# Patient Record
Sex: Female | Born: 1980 | Race: Black or African American | Hispanic: No | Marital: Married | Smoking: Never smoker
Health system: Southern US, Community
[De-identification: ages and names within clinical notes are randomized; demographics above are authoritative.]

## PROBLEM LIST (undated history)

## (undated) ENCOUNTER — Inpatient Hospital Stay (HOSPITAL_COMMUNITY): Payer: Self-pay

## (undated) DIAGNOSIS — O1410 Severe pre-eclampsia, unspecified trimester: Secondary | ICD-10-CM

## (undated) DIAGNOSIS — D649 Anemia, unspecified: Secondary | ICD-10-CM

## (undated) HISTORY — DX: Severe pre-eclampsia, unspecified trimester: O14.10

## (undated) HISTORY — PX: NO PAST SURGERIES: SHX2092

---

## 2001-02-14 ENCOUNTER — Inpatient Hospital Stay (HOSPITAL_COMMUNITY): Admission: AD | Admit: 2001-02-14 | Discharge: 2001-02-14 | Payer: Self-pay | Admitting: Obstetrics

## 2001-03-15 ENCOUNTER — Inpatient Hospital Stay (HOSPITAL_COMMUNITY): Admission: AD | Admit: 2001-03-15 | Discharge: 2001-03-15 | Payer: Self-pay | Admitting: Obstetrics & Gynecology

## 2001-03-19 ENCOUNTER — Other Ambulatory Visit: Admission: RE | Admit: 2001-03-19 | Discharge: 2001-03-19 | Payer: Self-pay | Admitting: Obstetrics and Gynecology

## 2001-04-15 ENCOUNTER — Inpatient Hospital Stay (HOSPITAL_COMMUNITY): Admission: AD | Admit: 2001-04-15 | Discharge: 2001-04-15 | Payer: Self-pay | Admitting: Obstetrics and Gynecology

## 2001-04-26 ENCOUNTER — Inpatient Hospital Stay (HOSPITAL_COMMUNITY): Admission: AD | Admit: 2001-04-26 | Discharge: 2001-04-26 | Payer: Self-pay | Admitting: *Deleted

## 2001-06-08 ENCOUNTER — Inpatient Hospital Stay (HOSPITAL_COMMUNITY): Admission: AD | Admit: 2001-06-08 | Discharge: 2001-06-08 | Payer: Self-pay | Admitting: Obstetrics and Gynecology

## 2001-06-30 ENCOUNTER — Inpatient Hospital Stay (HOSPITAL_COMMUNITY): Admission: AD | Admit: 2001-06-30 | Discharge: 2001-06-30 | Payer: Self-pay | Admitting: Obstetrics and Gynecology

## 2001-07-17 ENCOUNTER — Inpatient Hospital Stay (HOSPITAL_COMMUNITY): Admission: AD | Admit: 2001-07-17 | Discharge: 2001-07-17 | Payer: Self-pay | Admitting: Obstetrics and Gynecology

## 2001-07-20 ENCOUNTER — Inpatient Hospital Stay (HOSPITAL_COMMUNITY): Admission: RE | Admit: 2001-07-20 | Discharge: 2001-07-20 | Payer: Self-pay | Admitting: Obstetrics and Gynecology

## 2001-07-20 ENCOUNTER — Encounter: Payer: Self-pay | Admitting: Obstetrics and Gynecology

## 2001-07-23 ENCOUNTER — Inpatient Hospital Stay (HOSPITAL_COMMUNITY): Admission: AD | Admit: 2001-07-23 | Discharge: 2001-07-25 | Payer: Self-pay | Admitting: Obstetrics and Gynecology

## 2001-08-24 ENCOUNTER — Other Ambulatory Visit: Admission: RE | Admit: 2001-08-24 | Discharge: 2001-08-24 | Payer: Self-pay | Admitting: Obstetrics and Gynecology

## 2001-10-11 ENCOUNTER — Encounter: Payer: Self-pay | Admitting: Obstetrics and Gynecology

## 2001-10-11 ENCOUNTER — Ambulatory Visit (HOSPITAL_COMMUNITY): Admission: RE | Admit: 2001-10-11 | Discharge: 2001-10-11 | Payer: Self-pay | Admitting: Obstetrics and Gynecology

## 2001-10-14 ENCOUNTER — Encounter (INDEPENDENT_AMBULATORY_CARE_PROVIDER_SITE_OTHER): Payer: Self-pay | Admitting: Specialist

## 2001-10-14 ENCOUNTER — Encounter: Payer: Self-pay | Admitting: Emergency Medicine

## 2001-10-14 ENCOUNTER — Inpatient Hospital Stay (HOSPITAL_COMMUNITY): Admission: AD | Admit: 2001-10-14 | Discharge: 2001-10-16 | Payer: Self-pay | Admitting: Obstetrics and Gynecology

## 2001-11-28 ENCOUNTER — Inpatient Hospital Stay (HOSPITAL_COMMUNITY): Admission: AD | Admit: 2001-11-28 | Discharge: 2001-11-28 | Payer: Self-pay | Admitting: Obstetrics and Gynecology

## 2001-11-28 ENCOUNTER — Encounter: Payer: Self-pay | Admitting: Obstetrics and Gynecology

## 2001-12-04 ENCOUNTER — Encounter: Admission: RE | Admit: 2001-12-04 | Discharge: 2001-12-04 | Payer: Self-pay | Admitting: Gynecology

## 2001-12-25 ENCOUNTER — Encounter: Admission: RE | Admit: 2001-12-25 | Discharge: 2001-12-25 | Payer: Self-pay | Admitting: *Deleted

## 2002-01-22 ENCOUNTER — Emergency Department (HOSPITAL_COMMUNITY): Admission: EM | Admit: 2002-01-22 | Discharge: 2002-01-22 | Payer: Self-pay | Admitting: Emergency Medicine

## 2002-01-22 ENCOUNTER — Encounter: Payer: Self-pay | Admitting: Emergency Medicine

## 2002-04-27 ENCOUNTER — Inpatient Hospital Stay (HOSPITAL_COMMUNITY): Admission: AD | Admit: 2002-04-27 | Discharge: 2002-04-27 | Payer: Self-pay | Admitting: *Deleted

## 2002-04-30 ENCOUNTER — Encounter: Admission: RE | Admit: 2002-04-30 | Discharge: 2002-04-30 | Payer: Self-pay | Admitting: *Deleted

## 2002-04-30 ENCOUNTER — Other Ambulatory Visit: Admission: RE | Admit: 2002-04-30 | Discharge: 2002-04-30 | Payer: Self-pay | Admitting: *Deleted

## 2002-04-30 ENCOUNTER — Encounter (INDEPENDENT_AMBULATORY_CARE_PROVIDER_SITE_OTHER): Payer: Self-pay | Admitting: *Deleted

## 2002-05-15 ENCOUNTER — Inpatient Hospital Stay: Admission: AD | Admit: 2002-05-15 | Discharge: 2002-05-15 | Payer: Self-pay | Admitting: Obstetrics and Gynecology

## 2002-05-23 ENCOUNTER — Inpatient Hospital Stay (HOSPITAL_COMMUNITY): Admission: AD | Admit: 2002-05-23 | Discharge: 2002-05-23 | Payer: Self-pay | Admitting: *Deleted

## 2002-05-30 ENCOUNTER — Other Ambulatory Visit: Admission: RE | Admit: 2002-05-30 | Discharge: 2002-05-30 | Payer: Self-pay | Admitting: Obstetrics & Gynecology

## 2002-05-30 ENCOUNTER — Encounter (INDEPENDENT_AMBULATORY_CARE_PROVIDER_SITE_OTHER): Payer: Self-pay | Admitting: *Deleted

## 2002-05-30 ENCOUNTER — Encounter: Admission: RE | Admit: 2002-05-30 | Discharge: 2002-05-30 | Payer: Self-pay | Admitting: Internal Medicine

## 2002-08-15 ENCOUNTER — Encounter: Admission: RE | Admit: 2002-08-15 | Discharge: 2002-08-15 | Payer: Self-pay | Admitting: Obstetrics and Gynecology

## 2002-08-20 ENCOUNTER — Encounter: Admission: RE | Admit: 2002-08-20 | Discharge: 2002-08-20 | Payer: Self-pay | Admitting: *Deleted

## 2002-09-29 ENCOUNTER — Inpatient Hospital Stay (HOSPITAL_COMMUNITY): Admission: AD | Admit: 2002-09-29 | Discharge: 2002-09-29 | Payer: Self-pay | Admitting: Family Medicine

## 2002-11-07 ENCOUNTER — Encounter: Admission: RE | Admit: 2002-11-07 | Discharge: 2002-11-07 | Payer: Self-pay | Admitting: Internal Medicine

## 2002-11-08 ENCOUNTER — Encounter: Admission: RE | Admit: 2002-11-08 | Discharge: 2002-11-08 | Payer: Self-pay | Admitting: Internal Medicine

## 2002-11-30 ENCOUNTER — Inpatient Hospital Stay (HOSPITAL_COMMUNITY): Admission: AD | Admit: 2002-11-30 | Discharge: 2002-11-30 | Payer: Self-pay | Admitting: *Deleted

## 2002-12-19 ENCOUNTER — Encounter: Admission: RE | Admit: 2002-12-19 | Discharge: 2002-12-19 | Payer: Self-pay | Admitting: Internal Medicine

## 2002-12-24 ENCOUNTER — Encounter: Admission: RE | Admit: 2002-12-24 | Discharge: 2002-12-24 | Payer: Self-pay | Admitting: Obstetrics and Gynecology

## 2003-07-01 ENCOUNTER — Encounter: Admission: RE | Admit: 2003-07-01 | Discharge: 2003-07-01 | Payer: Self-pay | Admitting: Obstetrics and Gynecology

## 2003-07-14 ENCOUNTER — Inpatient Hospital Stay (HOSPITAL_COMMUNITY): Admission: AD | Admit: 2003-07-14 | Discharge: 2003-07-14 | Payer: Self-pay | Admitting: Obstetrics & Gynecology

## 2003-09-11 ENCOUNTER — Emergency Department (HOSPITAL_COMMUNITY): Admission: EM | Admit: 2003-09-11 | Discharge: 2003-09-11 | Payer: Self-pay | Admitting: Emergency Medicine

## 2003-09-15 ENCOUNTER — Encounter: Admission: RE | Admit: 2003-09-15 | Discharge: 2003-09-15 | Payer: Self-pay | Admitting: Internal Medicine

## 2003-10-18 ENCOUNTER — Inpatient Hospital Stay (HOSPITAL_COMMUNITY): Admission: AD | Admit: 2003-10-18 | Discharge: 2003-10-18 | Payer: Self-pay | Admitting: *Deleted

## 2004-01-14 ENCOUNTER — Inpatient Hospital Stay (HOSPITAL_COMMUNITY): Admission: AD | Admit: 2004-01-14 | Discharge: 2004-01-15 | Payer: Self-pay | Admitting: Obstetrics and Gynecology

## 2004-02-06 ENCOUNTER — Inpatient Hospital Stay (HOSPITAL_COMMUNITY): Admission: AD | Admit: 2004-02-06 | Discharge: 2004-02-06 | Payer: Self-pay | Admitting: Obstetrics & Gynecology

## 2004-02-19 ENCOUNTER — Emergency Department (HOSPITAL_COMMUNITY): Admission: EM | Admit: 2004-02-19 | Discharge: 2004-02-19 | Payer: Self-pay | Admitting: Emergency Medicine

## 2004-04-08 ENCOUNTER — Inpatient Hospital Stay (HOSPITAL_COMMUNITY): Admission: AD | Admit: 2004-04-08 | Discharge: 2004-04-09 | Payer: Self-pay | Admitting: *Deleted

## 2004-06-09 ENCOUNTER — Inpatient Hospital Stay (HOSPITAL_COMMUNITY): Admission: AD | Admit: 2004-06-09 | Discharge: 2004-06-09 | Payer: Self-pay | Admitting: *Deleted

## 2004-06-25 ENCOUNTER — Ambulatory Visit: Payer: Self-pay | Admitting: *Deleted

## 2004-06-25 ENCOUNTER — Ambulatory Visit (HOSPITAL_COMMUNITY): Admission: RE | Admit: 2004-06-25 | Discharge: 2004-06-25 | Payer: Self-pay | Admitting: *Deleted

## 2004-07-25 ENCOUNTER — Inpatient Hospital Stay (HOSPITAL_COMMUNITY): Admission: AD | Admit: 2004-07-25 | Discharge: 2004-07-25 | Payer: Self-pay | Admitting: Obstetrics and Gynecology

## 2004-09-01 ENCOUNTER — Inpatient Hospital Stay (HOSPITAL_COMMUNITY): Admission: AD | Admit: 2004-09-01 | Discharge: 2004-09-01 | Payer: Self-pay | Admitting: Obstetrics & Gynecology

## 2004-09-16 ENCOUNTER — Ambulatory Visit (HOSPITAL_COMMUNITY): Admission: RE | Admit: 2004-09-16 | Discharge: 2004-09-16 | Payer: Self-pay | Admitting: Obstetrics & Gynecology

## 2004-10-15 ENCOUNTER — Inpatient Hospital Stay (HOSPITAL_COMMUNITY): Admission: AD | Admit: 2004-10-15 | Discharge: 2004-10-15 | Payer: Self-pay | Admitting: Obstetrics & Gynecology

## 2004-10-20 ENCOUNTER — Inpatient Hospital Stay (HOSPITAL_COMMUNITY): Admission: AD | Admit: 2004-10-20 | Discharge: 2004-10-20 | Payer: Self-pay | Admitting: Obstetrics

## 2004-10-23 ENCOUNTER — Inpatient Hospital Stay (HOSPITAL_COMMUNITY): Admission: AD | Admit: 2004-10-23 | Discharge: 2004-10-23 | Payer: Self-pay | Admitting: Obstetrics

## 2004-10-28 ENCOUNTER — Inpatient Hospital Stay (HOSPITAL_COMMUNITY): Admission: AD | Admit: 2004-10-28 | Discharge: 2004-10-28 | Payer: Self-pay | Admitting: Obstetrics

## 2004-11-01 ENCOUNTER — Ambulatory Visit (HOSPITAL_COMMUNITY): Admission: RE | Admit: 2004-11-01 | Discharge: 2004-11-01 | Payer: Self-pay | Admitting: Obstetrics & Gynecology

## 2004-11-03 ENCOUNTER — Inpatient Hospital Stay (HOSPITAL_COMMUNITY): Admission: AD | Admit: 2004-11-03 | Discharge: 2004-11-03 | Payer: Self-pay | Admitting: Obstetrics

## 2004-11-07 ENCOUNTER — Inpatient Hospital Stay (HOSPITAL_COMMUNITY): Admission: AD | Admit: 2004-11-07 | Discharge: 2004-11-07 | Payer: Self-pay | Admitting: Obstetrics & Gynecology

## 2004-11-10 ENCOUNTER — Inpatient Hospital Stay (HOSPITAL_COMMUNITY): Admission: AD | Admit: 2004-11-10 | Discharge: 2004-11-12 | Payer: Self-pay | Admitting: Obstetrics

## 2005-11-14 ENCOUNTER — Inpatient Hospital Stay (HOSPITAL_COMMUNITY): Admission: AD | Admit: 2005-11-14 | Discharge: 2005-11-14 | Payer: Self-pay | Admitting: *Deleted

## 2006-01-12 ENCOUNTER — Encounter (INDEPENDENT_AMBULATORY_CARE_PROVIDER_SITE_OTHER): Payer: Self-pay | Admitting: Specialist

## 2006-01-12 ENCOUNTER — Ambulatory Visit: Payer: Self-pay | Admitting: Obstetrics and Gynecology

## 2006-04-17 ENCOUNTER — Emergency Department (HOSPITAL_COMMUNITY): Admission: EM | Admit: 2006-04-17 | Discharge: 2006-04-17 | Payer: Self-pay | Admitting: Family Medicine

## 2006-04-21 ENCOUNTER — Inpatient Hospital Stay (HOSPITAL_COMMUNITY): Admission: AD | Admit: 2006-04-21 | Discharge: 2006-04-22 | Payer: Self-pay | Admitting: Family Medicine

## 2006-04-27 ENCOUNTER — Encounter: Admission: RE | Admit: 2006-04-27 | Discharge: 2006-06-14 | Payer: Self-pay | Admitting: General Practice

## 2006-11-01 ENCOUNTER — Inpatient Hospital Stay (HOSPITAL_COMMUNITY): Admission: AD | Admit: 2006-11-01 | Discharge: 2006-11-01 | Payer: Self-pay | Admitting: Family Medicine

## 2007-01-01 ENCOUNTER — Inpatient Hospital Stay (HOSPITAL_COMMUNITY): Admission: AD | Admit: 2007-01-01 | Discharge: 2007-01-01 | Payer: Self-pay | Admitting: Gynecology

## 2007-02-02 ENCOUNTER — Inpatient Hospital Stay (HOSPITAL_COMMUNITY): Admission: AD | Admit: 2007-02-02 | Discharge: 2007-02-02 | Payer: Self-pay | Admitting: Gynecology

## 2007-03-21 ENCOUNTER — Ambulatory Visit (HOSPITAL_COMMUNITY): Admission: RE | Admit: 2007-03-21 | Discharge: 2007-03-21 | Payer: Self-pay | Admitting: Obstetrics & Gynecology

## 2007-04-21 ENCOUNTER — Emergency Department (HOSPITAL_COMMUNITY): Admission: EM | Admit: 2007-04-21 | Discharge: 2007-04-21 | Payer: Self-pay | Admitting: Emergency Medicine

## 2007-05-24 ENCOUNTER — Ambulatory Visit (HOSPITAL_COMMUNITY): Admission: RE | Admit: 2007-05-24 | Discharge: 2007-05-24 | Payer: Self-pay | Admitting: Family Medicine

## 2007-06-24 ENCOUNTER — Ambulatory Visit: Payer: Self-pay | Admitting: *Deleted

## 2007-06-24 ENCOUNTER — Inpatient Hospital Stay (HOSPITAL_COMMUNITY): Admission: AD | Admit: 2007-06-24 | Discharge: 2007-06-24 | Payer: Self-pay | Admitting: Obstetrics & Gynecology

## 2007-07-12 ENCOUNTER — Inpatient Hospital Stay (HOSPITAL_COMMUNITY): Admission: AD | Admit: 2007-07-12 | Discharge: 2007-07-12 | Payer: Self-pay | Admitting: Obstetrics & Gynecology

## 2007-07-23 ENCOUNTER — Inpatient Hospital Stay (HOSPITAL_COMMUNITY): Admission: AD | Admit: 2007-07-23 | Discharge: 2007-07-23 | Payer: Self-pay | Admitting: Obstetrics

## 2007-07-24 ENCOUNTER — Inpatient Hospital Stay (HOSPITAL_COMMUNITY): Admission: AD | Admit: 2007-07-24 | Discharge: 2007-07-24 | Payer: Self-pay | Admitting: Obstetrics

## 2007-07-27 ENCOUNTER — Inpatient Hospital Stay (HOSPITAL_COMMUNITY): Admission: AD | Admit: 2007-07-27 | Discharge: 2007-07-27 | Payer: Self-pay | Admitting: Obstetrics

## 2007-08-03 ENCOUNTER — Observation Stay (HOSPITAL_COMMUNITY): Admission: AD | Admit: 2007-08-03 | Discharge: 2007-08-04 | Payer: Self-pay | Admitting: Obstetrics

## 2007-08-07 ENCOUNTER — Inpatient Hospital Stay (HOSPITAL_COMMUNITY): Admission: AD | Admit: 2007-08-07 | Discharge: 2007-08-07 | Payer: Self-pay | Admitting: Obstetrics

## 2007-08-08 ENCOUNTER — Inpatient Hospital Stay (HOSPITAL_COMMUNITY): Admission: AD | Admit: 2007-08-08 | Discharge: 2007-08-08 | Payer: Self-pay | Admitting: Obstetrics

## 2007-08-11 ENCOUNTER — Inpatient Hospital Stay (HOSPITAL_COMMUNITY): Admission: AD | Admit: 2007-08-11 | Discharge: 2007-08-11 | Payer: Self-pay | Admitting: Obstetrics

## 2007-08-17 ENCOUNTER — Inpatient Hospital Stay (HOSPITAL_COMMUNITY): Admission: AD | Admit: 2007-08-17 | Discharge: 2007-08-19 | Payer: Self-pay | Admitting: Obstetrics

## 2008-06-20 ENCOUNTER — Inpatient Hospital Stay (HOSPITAL_COMMUNITY): Admission: AD | Admit: 2008-06-20 | Discharge: 2008-06-20 | Payer: Self-pay | Admitting: Obstetrics & Gynecology

## 2008-06-30 ENCOUNTER — Emergency Department (HOSPITAL_COMMUNITY): Admission: EM | Admit: 2008-06-30 | Discharge: 2008-06-30 | Payer: Self-pay | Admitting: Emergency Medicine

## 2008-07-09 ENCOUNTER — Emergency Department (HOSPITAL_BASED_OUTPATIENT_CLINIC_OR_DEPARTMENT_OTHER): Admission: EM | Admit: 2008-07-09 | Discharge: 2008-07-09 | Payer: Self-pay | Admitting: Emergency Medicine

## 2008-08-15 ENCOUNTER — Emergency Department (HOSPITAL_COMMUNITY): Admission: EM | Admit: 2008-08-15 | Discharge: 2008-08-15 | Payer: Self-pay | Admitting: Emergency Medicine

## 2009-01-12 ENCOUNTER — Emergency Department (HOSPITAL_BASED_OUTPATIENT_CLINIC_OR_DEPARTMENT_OTHER): Admission: EM | Admit: 2009-01-12 | Discharge: 2009-01-12 | Payer: Self-pay | Admitting: Emergency Medicine

## 2009-11-30 ENCOUNTER — Emergency Department (HOSPITAL_COMMUNITY): Admission: EM | Admit: 2009-11-30 | Discharge: 2009-11-30 | Payer: Self-pay | Admitting: Family Medicine

## 2010-03-24 ENCOUNTER — Inpatient Hospital Stay (HOSPITAL_COMMUNITY): Admission: AD | Admit: 2010-03-24 | Discharge: 2010-03-25 | Payer: Self-pay | Admitting: Obstetrics & Gynecology

## 2010-03-24 ENCOUNTER — Ambulatory Visit: Payer: Self-pay | Admitting: Advanced Practice Midwife

## 2010-05-05 ENCOUNTER — Inpatient Hospital Stay (HOSPITAL_COMMUNITY): Admission: AD | Admit: 2010-05-05 | Discharge: 2010-05-06 | Payer: Self-pay | Admitting: Obstetrics and Gynecology

## 2010-06-22 ENCOUNTER — Inpatient Hospital Stay (HOSPITAL_COMMUNITY): Admission: AD | Admit: 2010-06-22 | Discharge: 2010-06-22 | Payer: Self-pay | Admitting: Obstetrics & Gynecology

## 2010-06-25 ENCOUNTER — Ambulatory Visit (HOSPITAL_COMMUNITY)
Admission: RE | Admit: 2010-06-25 | Discharge: 2010-06-25 | Payer: Self-pay | Source: Home / Self Care | Admitting: Family Medicine

## 2010-07-29 ENCOUNTER — Inpatient Hospital Stay (HOSPITAL_COMMUNITY)
Admission: AD | Admit: 2010-07-29 | Discharge: 2010-07-29 | Payer: Self-pay | Source: Home / Self Care | Attending: Obstetrics & Gynecology | Admitting: Obstetrics & Gynecology

## 2010-08-23 ENCOUNTER — Inpatient Hospital Stay (HOSPITAL_COMMUNITY)
Admission: AD | Admit: 2010-08-23 | Discharge: 2010-08-23 | Payer: Self-pay | Source: Home / Self Care | Attending: Obstetrics and Gynecology | Admitting: Obstetrics and Gynecology

## 2010-08-25 LAB — COMPREHENSIVE METABOLIC PANEL
ALT: 20 U/L (ref 0–35)
AST: 28 U/L (ref 0–37)
Albumin: 3.1 g/dL — ABNORMAL LOW (ref 3.5–5.2)
Alkaline Phosphatase: 155 U/L — ABNORMAL HIGH (ref 39–117)
BUN: 5 mg/dL — ABNORMAL LOW (ref 6–23)
CO2: 23 mEq/L (ref 19–32)
Calcium: 9.1 mg/dL (ref 8.4–10.5)
Chloride: 105 mEq/L (ref 96–112)
Creatinine, Ser: 0.57 mg/dL (ref 0.4–1.2)
GFR calc Af Amer: >60 mL/min (ref >60)
GFR calc non Af Amer: >60 mL/min (ref >60)
Glucose, Bld: 88 mg/dL (ref 70–99)
Potassium: 3.2 mEq/L — ABNORMAL LOW (ref 3.5–5.1)
Sodium: 135 mEq/L (ref 135–145)
Total Bilirubin: 0.5 mg/dL (ref 0.3–1.2)
Total Protein: 7.1 g/dL (ref 6.0–8.3)

## 2010-08-25 LAB — DIFFERENTIAL
Basophils Absolute: 0 10*3/uL (ref 0.0–0.1)
Basophils Relative: 0 % (ref 0–1)
Eosinophils Absolute: 0.1 10*3/uL (ref 0.0–0.7)
Eosinophils Relative: 1 % (ref 0–5)
Lymphocytes Relative: 14 % (ref 12–46)
Lymphs Abs: 1.1 10*3/uL (ref 0.7–4.0)
Monocytes Absolute: 0.7 10*3/uL (ref 0.1–1.0)
Monocytes Relative: 9 % (ref 3–12)
Neutro Abs: 6.1 10*3/uL (ref 1.7–7.7)
Neutrophils Relative %: 76 % (ref 43–77)

## 2010-08-25 LAB — URINALYSIS, ROUTINE W REFLEX MICROSCOPIC
Bilirubin Urine: NEGATIVE
Hgb urine dipstick: NEGATIVE
Ketones, ur: 15 mg/dL — AB
Nitrite: NEGATIVE
Protein, ur: NEGATIVE mg/dL
Specific Gravity, Urine: >1.03 — ABNORMAL HIGH (ref 1.005–1.030)
Urine Glucose, Fasting: NEGATIVE mg/dL
Urobilinogen, UA: 2 mg/dL — ABNORMAL HIGH (ref 0.0–1.0)
pH: 6 (ref 5.0–8.0)

## 2010-08-25 LAB — CBC
HCT: 30.3 % — ABNORMAL LOW (ref 36.0–46.0)
Hemoglobin: 9.4 g/dL — ABNORMAL LOW (ref 12.0–15.0)
MCH: 23.3 pg — ABNORMAL LOW (ref 26.0–34.0)
MCHC: 31 g/dL (ref 30.0–36.0)
MCV: 75.2 fL — ABNORMAL LOW (ref 78.0–100.0)
Platelets: 261 10*3/uL (ref 150–400)
RBC: 4.03 MIL/uL (ref 3.87–5.11)
RDW: 16.5 % — ABNORMAL HIGH (ref 11.5–15.5)
WBC: 8 10*3/uL (ref 4.0–10.5)

## 2010-08-25 LAB — TSH: TSH: 1.099 u[IU]/mL (ref 0.350–4.500)

## 2010-08-28 ENCOUNTER — Encounter: Payer: Self-pay | Admitting: *Deleted

## 2010-08-30 DIAGNOSIS — R0602 Shortness of breath: Secondary | ICD-10-CM | POA: Insufficient documentation

## 2010-08-30 DIAGNOSIS — D509 Iron deficiency anemia, unspecified: Secondary | ICD-10-CM | POA: Insufficient documentation

## 2010-08-30 DIAGNOSIS — Z9189 Other specified personal risk factors, not elsewhere classified: Secondary | ICD-10-CM | POA: Insufficient documentation

## 2010-08-31 ENCOUNTER — Ambulatory Visit
Admission: RE | Admit: 2010-08-31 | Discharge: 2010-08-31 | Payer: Self-pay | Source: Home / Self Care | Attending: Cardiology | Admitting: Cardiology

## 2010-08-31 ENCOUNTER — Encounter: Payer: Self-pay | Admitting: Cardiology

## 2010-08-31 DIAGNOSIS — R079 Chest pain, unspecified: Secondary | ICD-10-CM | POA: Insufficient documentation

## 2010-09-02 ENCOUNTER — Inpatient Hospital Stay (HOSPITAL_COMMUNITY)
Admission: AD | Admit: 2010-09-02 | Discharge: 2010-09-02 | Payer: Self-pay | Source: Home / Self Care | Attending: Obstetrics and Gynecology | Admitting: Obstetrics and Gynecology

## 2010-09-09 ENCOUNTER — Ambulatory Visit (HOSPITAL_COMMUNITY): Payer: Medicaid Other | Attending: Obstetrics and Gynecology

## 2010-09-09 DIAGNOSIS — I2510 Atherosclerotic heart disease of native coronary artery without angina pectoris: Secondary | ICD-10-CM | POA: Insufficient documentation

## 2010-09-09 DIAGNOSIS — I059 Rheumatic mitral valve disease, unspecified: Secondary | ICD-10-CM | POA: Insufficient documentation

## 2010-09-09 DIAGNOSIS — I319 Disease of pericardium, unspecified: Secondary | ICD-10-CM | POA: Insufficient documentation

## 2010-09-09 DIAGNOSIS — O99891 Other specified diseases and conditions complicating pregnancy: Secondary | ICD-10-CM | POA: Insufficient documentation

## 2010-09-09 DIAGNOSIS — R011 Cardiac murmur, unspecified: Secondary | ICD-10-CM | POA: Insufficient documentation

## 2010-09-09 NOTE — Assessment & Plan Note (Signed)
Summary:  Cardiology   Visit Type:  new patient visit Primary Provider:  [facility]  CC:   and pressure in chest.  History of Present Illness: Christy [name] is a 30 year old F. American female who's been having a 3 week history of chest pain and felt like her heart shakes and flutters.  She is [redacted] weeks pregnant. This is her sixth pregnancy. She has had no complications from cardiac standpoint with previous pregnancies. There is no cardiac history.  She went to the emergency room on the 19th. Her EKG is normal. Potassium was low at 3.2. She was instructed on potassium rich foods but no supplemental potassium given. She is anemic with a hemoglobin 9.4. Her vital signs were stable with a normal pulse ox of 97%.  She notices the discomfort and the palpitations worse when she is lying on her left side. She denies orthopnea PND but she has had some dependent edema recently. Her blood pressures done a good control.  She has a history of reflux but has not had any recent reflux symptoms.  She denies any syncope or presyncope.she did not have this with previous pregnancies.    Current Medications (verified): 1)  Iron 325 (65 Fe) Mg Tabs (Ferrous Sulfate) .... Qd  Allergies (verified): 1)  ! Bactrim  Past History:  Past Medical History: Last updated: 08/30/2010 CHEST Caz Weaver PAIN, HX OF (ICD-V15.89) SHORTNESS OF BREATH (ICD-786.05) PALPITATIONS (ICD-785.1) ANEMIA, IRON DEFICIENCY (ICD-280.9)  Family History: Last updated: 08/31/2010 Family History of Hypertension: mother and father  Social History: Last updated: 08/30/2010 Tobacco Use - No.  Alcohol Use - no Drug Use - no  Risk Factors: Smoking Status: never (08/30/2010)  Past Surgical History: IUD removal  Family History: Family History of Hypertension: mother and father  Review of Systems       negative other than history of present illness  Vital Signs:  Patient profile:   30 year old  female Height:      64 inches Weight:      183 pounds BMI:     31.53 Pulse rate:   106 / minute Pulse rhythm:   irregular BP sitting:   106 / 58  (left arm) Cuff size:   large  Vitals Entered By: Danielle Rankin, CMA (August 31, 2010 3:33 PM)  Physical Exam  General:  Well developed, well nourished, in no acute distress. Head:  normocephalic and atraumatic Eyes:  PERRLA/EOM intact; conjunctiva and lids normal. Neck:  Neck supple, no JVD. No masses, thyromegaly or abnormal cervical nodes. Chest Derald Lorge:  no deformities or breast masses noted Lungs:  Clear bilaterally to auscultation and percussion. Heart:  PMI nondisplaced, rapid rate which is regular and about 100 105 beats per minute. Normal S1-S2 no gallop systolic murmur along left sternal border. It radiates into the base the right neck. Carotids are full. No thyromegaly.no gallop Abdomen:  gravida Msk:  Back normal, normal gait. Muscle strength and tone normal. Pulses:  pulses normal in all 4 extremities Extremities:  trace left pedal edema and trace right pedal edema.   Neurologic:  Alert and oriented x 3. Skin:  Intact without lesions or rashes. Psych:  Normal affect.   Problems:  Medical Problems Added: 1)  Dx of Murmur  (ICD-785.2) 2)  Dx of Chest Pain-unspecified  (ICD-786.50)  Impression & Recommendations:  Problem # 1:  PALPITATIONS (ICD-785.1) Assessment New  I suspect these are benign. They have probably been exacerbated by pregnancy and its inherent stress, anemia which is  causing her to be in sinus tachycardia, and hyperkalemia.Will check 2-D echo and give potassium supplementation. Her murmur is most likely benign however does radiate into the base of her right neck.   Orders: EKG w/ Interpretation (93000)  Problem # 2:  CHEST PAIN-UNSPECIFIED (ICD-786.50)  I suspect this is related to the palpitations, tachycardia and tension. Check echo to rule out any other causes.  Orders: EKG w/ Interpretation  (93000)  Other Orders: Echocardiogram (Echo)  Patient Instructions: 1)  Your physician has recommended you make the following change in your medication: Start Potassium 2)  Your physician has requested that you increase the amount of potassium in your diet. Please see [facility] handout. 3)  Your physician has requested that you have an echocardiogram.  Echocardiography is a painless test that uses sound waves to create images of your heart. It provides your doctor with information about the size and shape of your heart and how well your heart's chambers and valves are working.  This procedure takes approximately one hour. There are no restrictions for this procedure. SCHEDULE FOR NEXT WEEK Prescriptions: POTASSIUM CHLORIDE CRYS CR 20 MEQ CR-TABS (POTASSIUM CHLORIDE CRYS CR) Take one tablet by mouth daily  #30 x 6   Entered by:   Lisabeth Devoid RN   Authorized by:   Gaylord Shih, MD, [facility]   Signed by:   Lisabeth Devoid RN on 08/31/2010   Method used:   Electronically to        [facility]  [address] [phone] (retail)       [address]       [place], [place]  [id number]       Ph: [phone] or [phone]       Fax: [phone]   RxID:   [id number]

## 2010-09-15 NOTE — Assessment & Plan Note (Signed)
Summary: np6. 36 weeks, heart fluttering on pain right chest Christy [name]. sob...   Allergies: 1)  ! Bactrim

## 2010-09-16 ENCOUNTER — Inpatient Hospital Stay (HOSPITAL_COMMUNITY)
Admission: AD | Admit: 2010-09-16 | Discharge: 2010-09-19 | DRG: 774 | Disposition: A | Discharge status: Home / Self Care | Payer: Medicaid Other | Source: Ambulatory Visit | Attending: Obstetrics and Gynecology | Admitting: Obstetrics and Gynecology

## 2010-09-16 DIAGNOSIS — O9903 Anemia complicating the puerperium: Secondary | ICD-10-CM | POA: Diagnosis not present

## 2010-09-16 DIAGNOSIS — D649 Anemia, unspecified: Secondary | ICD-10-CM | POA: Diagnosis not present

## 2010-09-16 LAB — COMPREHENSIVE METABOLIC PANEL
AST: 41 U/L — ABNORMAL HIGH (ref 0–37)
Albumin: 3.3 g/dL — ABNORMAL LOW (ref 3.5–5.2)
BUN: 4 mg/dL — ABNORMAL LOW (ref 6–23)
CO2: 18 mEq/L — ABNORMAL LOW (ref 19–32)
Calcium: 9.4 mg/dL (ref 8.4–10.5)
Chloride: 108 mEq/L (ref 96–112)
Glucose, Bld: 78 mg/dL (ref 70–99)
Potassium: 3.5 mEq/L (ref 3.5–5.1)
Total Protein: 7.2 g/dL (ref 6.0–8.3)

## 2010-09-16 LAB — CBC: HCT: 32 % — ABNORMAL LOW (ref 36.0–46.0)

## 2010-09-18 LAB — CBC
HCT: 26.4 % — ABNORMAL LOW (ref 36.0–46.0)
MCHC: 29.9 g/dL — ABNORMAL LOW (ref 30.0–36.0)
MCV: 74.8 fL — ABNORMAL LOW (ref 78.0–100.0)
Platelets: 237 10*3/uL (ref 150–400)
RDW: 18.1 % — ABNORMAL HIGH (ref 11.5–15.5)
WBC: 9.2 10*3/uL (ref 4.0–10.5)

## 2010-09-18 LAB — COMPREHENSIVE METABOLIC PANEL
BUN: 4 mg/dL — ABNORMAL LOW (ref 6–23)
GFR calc non Af Amer: >60 mL/min (ref >60)
Glucose, Bld: 79 mg/dL (ref 70–99)
Total Protein: 5.5 g/dL — ABNORMAL LOW (ref 6.0–8.3)

## 2010-10-05 ENCOUNTER — Emergency Department (HOSPITAL_COMMUNITY)
Admission: EM | Admit: 2010-10-05 | Discharge: 2010-10-05 | Disposition: A | Discharge status: Home / Self Care | Payer: Medicaid Other | Attending: Emergency Medicine | Admitting: Emergency Medicine

## 2010-10-05 DIAGNOSIS — R220 Localized swelling, mass and lump, head: Secondary | ICD-10-CM | POA: Insufficient documentation

## 2010-10-05 DIAGNOSIS — K219 Gastro-esophageal reflux disease without esophagitis: Secondary | ICD-10-CM | POA: Insufficient documentation

## 2010-10-05 DIAGNOSIS — R51 Headache: Secondary | ICD-10-CM | POA: Insufficient documentation

## 2010-10-05 DIAGNOSIS — R221 Localized swelling, mass and lump, neck: Secondary | ICD-10-CM | POA: Insufficient documentation

## 2010-10-05 DIAGNOSIS — R599 Enlarged lymph nodes, unspecified: Secondary | ICD-10-CM | POA: Insufficient documentation

## 2010-10-18 LAB — WET PREP, GENITAL
Clue Cells Wet Prep HPF POC: NONE SEEN
Trich, Wet Prep: NONE SEEN
Yeast Wet Prep HPF POC: NONE SEEN

## 2010-10-19 LAB — GC/CHLAMYDIA PROBE AMP, GENITAL
Chlamydia, DNA Probe: NEGATIVE
GC Probe Amp, Genital: NEGATIVE

## 2010-10-19 LAB — URINALYSIS, ROUTINE W REFLEX MICROSCOPIC
Glucose, UA: NEGATIVE mg/dL
Ketones, ur: 15 mg/dL — AB
Nitrite: NEGATIVE
Protein, ur: NEGATIVE mg/dL

## 2010-10-19 LAB — WET PREP, GENITAL

## 2010-10-19 LAB — RAPID URINE DRUG SCREEN, HOSP PERFORMED: Benzodiazepines: NOT DETECTED

## 2010-10-21 LAB — URINALYSIS, ROUTINE W REFLEX MICROSCOPIC
Bilirubin Urine: NEGATIVE
Ketones, ur: NEGATIVE mg/dL
Nitrite: NEGATIVE
Protein, ur: NEGATIVE mg/dL
Specific Gravity, Urine: >1.03 — ABNORMAL HIGH (ref 1.005–1.030)
Urobilinogen, UA: 0.2 mg/dL (ref 0.0–1.0)

## 2010-10-21 LAB — GC/CHLAMYDIA PROBE AMP, GENITAL
Chlamydia, DNA Probe: NEGATIVE
GC Probe Amp, Genital: NEGATIVE

## 2010-10-21 LAB — WET PREP, GENITAL: Trich, Wet Prep: NONE SEEN

## 2010-11-22 LAB — POCT I-STAT, CHEM 8
BUN: 6 mg/dL (ref 6–23)
Calcium, Ion: 1.22 mmol/L (ref 1.12–1.32)
Creatinine, Ser: 0.7 mg/dL (ref 0.4–1.2)
Glucose, Bld: 84 mg/dL (ref 70–99)
TCO2: 22 mmol/L (ref 0–100)

## 2010-11-22 LAB — POCT CARDIAC MARKERS
CKMB, poc: <1 ng/mL — ABNORMAL LOW (ref 1.0–8.0)
Myoglobin, poc: 31.9 ng/mL (ref 12–200)
Troponin i, poc: <0.05 ng/mL (ref 0.00–0.09)

## 2010-11-22 LAB — D-DIMER, QUANTITATIVE

## 2010-12-24 NOTE — Group Therapy Note (Signed)
NAMEAURIAH, Christy [name]                       ACCOUNT NO.:  [id number]   MEDICAL RECORD NO.:  [id number]                   PATIENT TYPE:  OUT   LOCATION:  [facility]                           FACILITY:  [facility]   PHYSICIAN:  Elsie Lincoln, MD                   DATE OF BIRTH:  [birth year]   DATE OF SERVICE:  07/01/2003                                    CLINIC NOTE   REASON FOR VISIT:  This is a 30 year old G 5, para 3-0-2-3, LMP May 12, 2003 with a positive pregnancy test late October and then a negative  pregnancy test at home and a negative pregnancy test here today.  After a  long discussion and education the patient realizes that she had an early  miscarriage.  The patient has not had any bleeding yet, however she did  place the NuvaRing after a negative pregnancy test, left it in for three  weeks and she just removed it and is expecting her period this week.  The  patient comes complaining of ovarian pain for 1-1/2 months.  The pain is  intermittent and sharp causing her to double over.  She had had a history of  chronic pelvic pain before but this is secondary to chronic constipation.  She takes a stool softener at this time and has regular bowel movements.  The patient denies any correlation with cycle with this pain.  The patient  denies nausea, vomiting, diarrhea. The patient has normal menses up until  May 12, 2003 using the NuvaRing.   PAST MEDICAL HISTORY:  Denies.   PAST SURGICAL HISTORY:  A laparoscopic IUD retrieval done here in 2003.   GYNECOLOGICAL HISTORY:  Chlamydia in the past.  Three spontaneous vaginal  deliveries and two early miscarriages.  The patient is bisexual and has  currently both female and female partners.  She also complains of chronic  yeast infection.   FAMILY HISTORY:  No history of uterine, ovarian, cervical cancer.   PHYSICAL EXAMINATION:  VITAL SIGNS:  Temperature 97.4, pulse 88, blood  pressure 106/66, weight 152.1, height 5 feet 4  inches.  ABDOMEN:  Soft, nontender, nondistended.  PELVIC:  External genitalia Tanner V.  Vagina small amount of white chunky  discharge consistent with yeast.  Cervix closed, nontender, nonfriable.  Uterus small, anteverted, nontender.  Adnexa:  Ovaries palpated, nontender,  not enlarged.   ASSESSMENT:  A 30 year old female with adnexal pain for 1-1/2 months and  vaginitis.   PLAN:  1. Adnexal pain:  Will get transvaginal ultrasound to evaluate ovaries.  GC     and Chlamydia done today to rule out cervicitis and early PID.  2. Diflucan 150 mg p.o. q. week x2 weeks with refill given.  If the patient     returns and yeast still present, will take yeast culture to see if     species is Candida albicans.  The patient states she has problems with  chronic yeast.  3. Pap smear done today.  4. The patient requests ________ secondary to multiple sex partners.  The     patient encouraged to use condoms and     dental dams whenever sexually active.  5. Return to clinic in six weeks.                                               Elsie Lincoln, MD    KL/MEDQ  D:  07/01/2003  T:  07/01/2003  Job:  [id number]

## 2010-12-24 NOTE — Group Therapy Note (Signed)
[name], [name] NO.:  [id number]   MEDICAL RECORD NO.:  [id number]          PATIENT TYPE:  WOC   LOCATION:  [facility]                   FACILITY:  [facility]   PHYSICIAN:  Argentina Donovan, MD        DATE OF BIRTH:  [birth year]   DATE OF SERVICE:  01/12/2006                                    CLINIC NOTE   HISTORY OF PRESENT ILLNESS:  The patient is a 30 year old gravida 6, para 4-  0-2-4 with one child 104-year-old and who had a voluntary interruption of  pregnancy in February.  Since that time, she has had increasing problems  with dyspareunia, especially on deep penetration.  The last two cycles where  she has been on a Nuva Ring, she has had increasing dysmenorrhea with  increase in bleeding.  She has no bleeding between her cycles.  The pain  with her last two cycles has been bad enough that she was unable to work and  she has a Garment/textile technologist job.   PHYSICAL EXAMINATION:  ABDOMEN:  Soft, flat, nontender.  No masses or  organomegaly.  External genitalia is normal.  BUS within normal limits.  Vagina is clean and well rugated.  Cervix is clean and parous.  Pap smear  was taken as well as a wet-prep and check for GC/Chlamydia.  Uterus is small  anterior and normal in size, shape and consistency and not very tender.  Nor  is the adnexa abnormal.  However, there is some discomfort on deep palpation  of the mid portion of the anterior vaginal wall.  Discussed her findings  with the patient.  I do not see a significant amount of descensus.  She is  showing signs of, I think, possibility of adenomyosis which may be  increasing.  She has been using the [employer] successfully for the last  several months.  I suggest she use it three months regularly and only stop  it for a week in the three months to see if she could live with this  increase in dysmenorrhea.  The only alternative that I think we have for her  would be hysterectomy.  On the other hand, the  dyspareunia I have a  difficult time explaining.  She has been with the same partner, it is not as  if she changed partners and size has changed, but good possibility I think  of an early interstitial cystitis remains, and I expect that if that is the  case, the symptoms in time will get worse.  I have suggested that she change  positions and vary positions during coitus, thinking that if she does this,  the uterus will fall out of the pelvis and change positions and relieve that  discomfort.  However, if it continues in spite of that, then I think  referral to a urologist is appropriate.   IMPRESSION:  Increasing dysmenorrhea with dyspareunia.           ______________________________  Argentina Donovan, MD     PR/MEDQ  D:  01/12/2006  T:  01/13/2006  Job:  [id number]

## 2010-12-24 NOTE — Discharge Summary (Signed)
[facility]  Patient:    Christy [name], Christy [name] Visit Number: [id number] MRN: [id number]          Service Type: OBS Location: 910A 9112 01 Attending Physician:  Wandalee Ferdinand Dictated by:   Rudy Jew Ashley Royalty, M.D. Admit Date:  07/23/2001 Discharge Date: 07/25/2001                             Discharge Summary  DISCHARGE DIAGNOSES:          1. Intrauterine pregnancy at term, delivered.                               2. Group B Strep carrier.                               3. Late presentation for care.                               4. Difficult social situation.                               5. Term birth living child, vertex.  PROCEDURE:                    OB delivery.  CONSULTING PHYSICIANS:        None.  DISCHARGE MEDICATIONS:        Motrin 800 mg t.i.d. p.r.n.  HISTORY OF PRESENT ILLNESS:   This is a 30 year old, gravida 4, para 2, abortus 1, at term with the aforementioned risk factors.  The patient presented for induction at her request due to limited availability of childcare and advanced cervical changes at term.  She did have the amniocentesis on July 20, 2001, which demonstrated fetal lung maturity.  HOSPITAL COURSE:              The patient was admitted to [facility].  Admission laboratory studies were drawn.  She went on to deliver on July 23, 2001, a 6 pound 10 ounce female, Apgars 9 at one minute and 9 at five minutes sent to the newborn nursery.  Delivery was accomplished by Fayrene Fearing A. Ashley Royalty, M.D. over intact perineum.  The patients postpartum course was benign.  She was discharged on July 25, 2001, afebrile and in satisfactory condition.  ACCESSORY CLINICAL DATA:      Hemoglobin and hematocrit on admission were 9.6 and 28.8, respectively.  Repeat values were obtained on July 24, 2001, and the values were 8.8 and 26.3, respectively.  DISPOSITION:                  The patient is to return  to [facility] in four to six weeks for postpartum evaluation. Dictated by:   Rudy Jew Ashley Royalty, M.D. Attending Physician:  Wandalee Ferdinand DD:  08/24/01 TD:  08/27/01 Job: [id number]

## 2010-12-24 NOTE — Discharge Summary (Signed)
[facility]  Patient:    Christy [name], Christy [name] Visit Number: [id number] MRN: [id number]          Service Type: GYN Location: 910A 9144 01 Attending Physician:  Enid Cutter Dictated by:   Shearon Balo,, M.D. Admit Date:  10/14/2001 Discharge Date: 10/16/2001                             Discharge Summary  REASON FOR ADMISSION:         Abdominal pain.  HISTORY OF PRESENT ILLNESS:   A 30 year old, gravida 4, para 3-0-1-3, with abdominal intrauterine device.  HOSPITAL COURSE:              The patient is a 30 year old, gravida 4, para 3-0-1-3, who was admitted on October 14, 2001, with increasing abdominal pain. She has been known to have abdominal placement of an IUD that was placed shortly after spontaneous vaginal delivery in December of 2002.  She began having worsening abdominal pain.  She presented to the emergency department on the day of admission with increasing abdominal pain and KUB and CT scan were performed which revealed abdominal placement of her IUD.  The patient was admitted by [facility] E. Dareen Piano, M.D. for evaluation and treatment.  The day of surgery, Dr. Dareen Piano was unable to perform her surgery and she was transferred to the [facility] for further evaluation and treatment.  The patient was taken to the operating room where she underwent diagnostic laparoscopy.  Intraoperatively, she was found to have an IUD that was encapsulated with omental adhesions.  The IUD was successfully excised and removed.  The patient tolerated the procedure well and returned to the recovery room in stable condition.  The patient remained in the hospital overnight for observation.  She was discharged home on postoperative day #1 after tolerating a regular diet and having good pain control.  In addition, she was voiding spontaneously without difficulty.  She is ambulating well. The patient was discharged home in good condition.  DISCHARGE  INSTRUCTIONS:       Minimize activity through the next week.  No dietary restrictions.  Her bandages will be removed in three days.  She is to follow up with Dr. Orlene Erm in the [facility] in approximately two weeks.  DISCHARGE MEDICATIONS:        1. Vicodin one to two p.o. q.4-6h. p.r.n.                               2. Motrin one p.o. q.8h. p.r.n.                               3. Colace q.d.Dictated by:   Shearon Balo,, M.D.  Attending Physician:  Enid Cutter DD:  11/07/01 TD:  11/08/01 Job: [id number]

## 2010-12-24 NOTE — Op Note (Signed)
[facility]  Patient:    Christy [name], Christy [name] Visit Number: [id number] MRN: [id number]          Service Type: EMS Location: MINO Attending Physician:  Devoria Albe Dictated by:   Shearon Balo, M.D. Proc. Date: 10/15/01 Admit Date:  10/14/2001 Discharge Date: 10/14/2001                             Operative Report  PREOPERATIVE DIAGNOSES:       A 30 year old G4, P3-0-1-3 with abdominal intrauterine device.  POSTOPERATIVE DIAGNOSES:      A 30 year old G4, P3-0-1-3 with abdominal intrauterine device, encapsulated intrauterine device and omental adhesions.  PROCEDURE:                    Laparoscopic removal of intrauterine device and omental adhesions.  SURGEON:                      Shearon Balo, M.D.  ANESTHESIA:                   General endotracheal.  COMPLICATIONS:                None.  SPECIMENS:                    IUD with omental adhesions and metallic appearing ring structure found along the colon.  DISPOSITION:                  Recovery room, stable.  INDICATIONS FOR PROCEDURE:    The patient is a 30 year old G4, P3-0-1-3 status post spontaneous vaginal delivery in December 2002.  The patient underwent IUD placement in a local physicians office.  Subsequent to IUD placement approximately four weeks postpartum she began to have increasing abdominal pain.  This worsened over the past few weeks.  She has also been complaining of constipation.  She underwent physical examination which revealed no IUD strings and ultrasound which revealed no evidence of intrauterine placement of the IUD.  A KUB and CT scan were performed which revealed abdominal placement of the IUD near the cecum.  The patient was consented for laparoscopic removal of the IUD.  She was given the risks of surgery including bleeding, infection, injury to internal organs such as bowel and bladder.  She also was consented preoperatively for possible laparotomy.  PROCEDURE:                     Patient was taken to the operating room.  She was placed under general endotracheal anesthesia.  She was placed in the [place] stirrups and prepped and draped in a sterile fashion.  Foley catheter was placed in the bladder through the procedure.  An umbilical incision was made with a scalpel and the Veress needle was inserted.  The abdomen was then insufflated with CO2.  A 10 mm trocar was introduced through the umbilical incision.  Visualization in the pelvis revealed what appeared to be the IUD at the tip of some omentum.  A decision was made to put in second ports and a midline 5 mm trocar was inserted under direct visualization as well as a left lateral 5 mm port.  The IUD was identified and grasped with an atraumatic grasper.  There were dense adhesions across the IUD of omentum.  The decision was made to use electrocautery and endo shears to dissect across the  omental adhesions.  This was performed with coagulation.  This was dissected without difficulty.  Once the IUD was freed it was removed through one of the 5 mm ports.  Irrigation was performed of this area.  At the end of the procedure there was noted to be a small metallic structure along the bowel that was grasped with an atraumatic grasper and brought through the incision and sent for pathology as well.  Irrigation of the pelvis revealed no active site of bleeding.  There was no evidence of any further metallic objects after exploration of the abdomen.  The appendix was visualized and appeared to be normal.  The trocars were removed under direct visualization and the CO2 was evacuated from the abdomen.  The umbilical incision was repaired with a subcuticular stitch of 2-0 Vicryl.  The two 5 mm port incisions were repaired with interrupted sutures of 4-0 Monocryl.  These incisions were hemostatic. The instruments were removed from the vagina and the cervix was noted to be hemostatic.  The Foley catheter was removed  and the patient was awakened and taken to the recovery room in stable condition. Dictated by:   Shearon Balo, M.D. Attending Physician:  Devoria Albe DD:  10/15/01 TD:  10/16/01 Job: [id number]

## 2010-12-24 NOTE — Discharge Summary (Signed)
[facility]  Patient:    Christy [name], Christy [name] Visit Number: [id number] MRN: [id number]          Service Type: OBS Location: 910A 9112 01 Attending Physician:  Wandalee Ferdinand Dictated by:   Rudy Jew Ashley Royalty, M.D. Admit Date:  07/23/2001 Discharge Date: 07/25/2001                             Discharge Summary  DISCHARGE DIAGNOSES:          1. Intrauterine pregnancy at term, delivered.                               2. Positive Group B Streptococcus culture.                               3. Late presentation for obstetric care.                               4. Difficult social situation.                               5. Term birth living child vertex.  OPERATIONS AND SPECIAL PROCEDURES:                   OB delivery.  CONSULTATIONS:                None.  DISCHARGE MEDICATIONS:        Motrin 800 mg t.i.d. p.r.n.  HISTORY AND PHYSICAL:         This is a 30 year old, Gravida 4, Para 2, AB 1, 38 weeks 2 days gestation.  Prenatal care was complicated by late presentation, atypia on Pap smear and positive Group B Strep culture.  The patient was admitted for induction/augmentation of labor secondary to limited availability of child care in the coming week.  She did have an amniocentesis July 20, 2001 which documented fetal lung maturity.  For the remainder of the history and physical please see the chart.  HOSPITAL COURSE:              The patient was admitted to [facility].  Admission laboratory studies were drawn.  She received a vaginal examination on July 23, 2002 which revealed the cervix to be 3+ cm dilated, 70% effaced, minus 1 minus 2 station, vertex presentation. Artificial rupture of membranes was accomplished which revealed a clear fluid. Pitocin was provided.  The patient went on to labor and delivered at 2:06 p.m. on July 23, 2001.  The infant was a 6 pound 10 ounce female, Apgars 9 at one minute and 9  at five minutes, sent to [facility].  Delivery was accomplished per Dr. Sylvester Harder over an intact perineum without any laceration.  The patients post partum course was benign.  She was discharged on the second post partum day, afebrile and in satisfactory condition.  ACCESSORY CLINICAL FINDINGS:  Hemoglobin and hematocrit were obtained on admission and the values were 9.6 and 28.8 respectively.  Repeat values obtained July 24, 2001 revealed values of 8.8 and 26.3 respectively.  RPR was nonreactive.  DISPOSITION:  The patient is to return to [facility] in four to six weeks for postpartum evaluation.Dictated by:   Rudy Jew Ashley Royalty, M.D. Attending Physician:  Wandalee Ferdinand DD:  07/25/01 TD:  07/26/01 Job: [id number]

## 2011-04-27 LAB — WET PREP, GENITAL
Trich, Wet Prep: NONE SEEN
Yeast Wet Prep HPF POC: NONE SEEN

## 2011-04-27 LAB — CBC
Hemoglobin: 8.6 — ABNORMAL LOW
Platelets: 279
RBC: 3.58 — ABNORMAL LOW
RDW: 18.2 — ABNORMAL HIGH
RDW: 18.8 — ABNORMAL HIGH
WBC: 5.6

## 2011-04-27 LAB — RPR: RPR Ser Ql: NONREACTIVE

## 2011-05-10 LAB — WET PREP, GENITAL
Trich, Wet Prep: NONE SEEN
Yeast Wet Prep HPF POC: NONE SEEN

## 2011-05-10 LAB — GC/CHLAMYDIA PROBE AMP, GENITAL
Chlamydia, DNA Probe: NEGATIVE
GC Probe Amp, Genital: NEGATIVE

## 2011-05-13 LAB — URINALYSIS, ROUTINE W REFLEX MICROSCOPIC
Bilirubin Urine: NEGATIVE
Glucose, UA: NEGATIVE
Hgb urine dipstick: NEGATIVE
Ketones, ur: NEGATIVE
Nitrite: NEGATIVE
pH: 6
pH: 6

## 2011-05-13 LAB — STREP B DNA PROBE: Strep Group B Ag: NEGATIVE

## 2011-05-13 LAB — URINE MICROSCOPIC-ADD ON

## 2011-05-16 LAB — URINALYSIS, ROUTINE W REFLEX MICROSCOPIC
Nitrite: NEGATIVE
Specific Gravity, Urine: 1.015
pH: 7

## 2011-05-17 LAB — WET PREP, GENITAL

## 2011-05-17 LAB — URINALYSIS, ROUTINE W REFLEX MICROSCOPIC
Bilirubin Urine: NEGATIVE
Nitrite: NEGATIVE
Specific Gravity, Urine: 1.015
Urobilinogen, UA: 0.2

## 2011-05-17 LAB — GC/CHLAMYDIA PROBE AMP, GENITAL
Chlamydia, DNA Probe: NEGATIVE
GC Probe Amp, Genital: NEGATIVE

## 2012-10-01 ENCOUNTER — Emergency Department (HOSPITAL_COMMUNITY)
Admission: EM | Admit: 2012-10-01 | Discharge: 2012-10-01 | Disposition: A | Discharge status: Home / Self Care | Payer: Medicaid Other | Source: Home / Self Care | Attending: Emergency Medicine | Admitting: Emergency Medicine

## 2012-10-01 ENCOUNTER — Encounter (HOSPITAL_COMMUNITY): Payer: Self-pay | Admitting: Emergency Medicine

## 2012-10-01 DIAGNOSIS — R0989 Other specified symptoms and signs involving the circulatory and respiratory systems: Secondary | ICD-10-CM

## 2012-10-01 DIAGNOSIS — H9203 Otalgia, bilateral: Secondary | ICD-10-CM

## 2012-10-01 DIAGNOSIS — L905 Scar conditions and fibrosis of skin: Secondary | ICD-10-CM

## 2012-10-01 NOTE — ED Notes (Signed)
Bilateral ear pain.  Also c/o right ankle pain sensitive area

## 2012-10-01 NOTE — ED Notes (Signed)
Patient is on the phone constantly, asked repeatedly to put down phone to receive instructions for her and daughter.

## 2012-10-01 NOTE — ED Provider Notes (Signed)
History     CSN: [id number]  Arrival date & time 10/01/12  1033   First MD Initiated Contact with Patient 10/01/12 1125      Chief Complaint  Patient presents with  . Otalgia    (Consider location/radiation/quality/duration/timing/severity/associated sxs/prior treatment) HPI Comments: Patient presents urgent care describing for several weeks she's been experiencing bilateral ear discomfort. As well as discomfort in the anterior aspect of both sides of her neck. Feel sore at touch. Patient denies any ear drainage, trauma injury, recent upper respiratory congestion such as cold-like symptoms. Patient denies any sore throat, vertigo, tinnitus or previous ear problems. She describes the pain as not constant, " comes and goes".   Patient also describes that in the righ anterior table region she has been feeling a little "bump", at times feels a bit sore. In her skin there feels somewhat "hard", no redness, no active drainage or warmth.  Patient also describes that she was to be evaluated for a small area of darkening and induration on the upper portion of one of her gluteal regions. It's been there for probably months. No tenderness, redness or swelling.  Patient is a 32 y.o. female presenting with ear pain. The history is provided by the patient.  Otalgia Location:  Bilateral Behind ear:  No abnormality Quality:  Aching Severity:  Mild Onset quality:  Gradual Timing:  Intermittent Progression:  Waxing and waning Relieved by:  Nothing Worsened by:  Nothing tried Ineffective treatments:  None tried Associated symptoms: neck pain   Associated symptoms: no congestion, no cough, no diarrhea, no ear discharge, no fever, no headaches, no hearing loss, no rash, no rhinorrhea, no sore throat and no tinnitus   Risk factors: no recent travel, no chronic ear infection and no prior ear surgery     History reviewed. No pertinent past medical history.  History reviewed. No pertinent past  surgical history.  No family history on file.  History  Substance Use Topics  . Smoking status: Never Smoker   . Smokeless tobacco: Not on file  . Alcohol Use: No    OB History   Grav Para Term Preterm Abortions TAB SAB Ect Mult Living                  Review of Systems  Constitutional: Negative for fever, chills, diaphoresis, activity change and appetite change.  HENT: Positive for ear pain and neck pain. Negative for hearing loss, congestion, sore throat, rhinorrhea, neck stiffness, tinnitus and ear discharge.   Respiratory: Negative for cough.   Gastrointestinal: Negative for diarrhea.  Skin: Negative for rash.  Neurological: Negative for headaches.    Allergies  Sulfamethoxazole w-trimethoprim  Home Medications  No current outpatient prescriptions on file.  BP 115/73  Pulse 73  Temp(Src) 98.1 F (36.7 C) (Oral)  Resp 18  SpO2 100%  LMP 09/17/2012  Physical Exam  Nursing note and vitals reviewed. Constitutional: She appears well-developed and well-nourished.  HENT:  Head: Normocephalic and atraumatic.  Right Ear: Hearing, tympanic membrane, external ear and ear canal normal. No drainage, swelling or tenderness. No foreign bodies. Tympanic membrane is not injected. No middle ear effusion.  Left Ear: Hearing, tympanic membrane, external ear and ear canal normal. No drainage, swelling or tenderness. No foreign bodies. Tympanic membrane is not injected.  No middle ear effusion.  Ears:  Mouth/Throat: Oropharynx is clear and moist. No oropharyngeal exudate.  Skin: Skin is dry. No abrasion, no bruising, no burn, no laceration, no lesion, no petechiae and  no purpura noted. Rash is not macular, not papular, not maculopapular, not nodular, not pustular, not vesicular and not urticarial. No cyanosis or erythema.       ED Course  Procedures (including critical care time)  Labs Reviewed - No data to display No results found.   1. Otalgia of both ears   2. Lymph  node symptom   3. Scar tissue       MDM  Multiple problems- of NOTE, patient was somewhat difficult to relay information to, as patient was constantly on the phone and texting even when requested to interrupt her phone communications while she was being examined as well during moments where I was discussing the management of her daughters condition that presented today to be evaluated .  Problem #1 no signs of infection of both internal or external ear. It was noted that both her ear canals were somewhat scaly and dry have recommended her to use either all avoid all mineral oil twice a week for a few weeks, if pain or discomfort was to persist or worsening have recommended she followup with primary care Dr. for further evaluation and perhaps an ENT referral and she's been having this problem for several weeks.  Problem #2 patient concerned about a area of focal fibrosis/scar tissue in the pretibial area with an associated superficial lymphadenopathy.   Problem #3 patient also concerned about a hyperpigmented- region on her gluteal area, small scar tissue from what looks like a previous soft tissue infection or furuncle or folliculitis region.      Jimmie Molly, MD 10/01/12 1315

## 2012-10-01 NOTE — ED Notes (Signed)
Daughter being seen in same treatment room

## 2012-10-01 NOTE — ED Notes (Signed)
Pt called in all waiting areas; no answer x 1 

## 2012-10-01 NOTE — ED Notes (Signed)
Called for pt @ 11:36; no answer

## 2013-04-29 ENCOUNTER — Inpatient Hospital Stay (HOSPITAL_COMMUNITY)
Admission: AD | Admit: 2013-04-29 | Discharge: 2013-04-29 | Disposition: A | Discharge status: Home / Self Care | Payer: Medicaid Other | Source: Ambulatory Visit | Attending: Obstetrics and Gynecology | Admitting: Obstetrics and Gynecology

## 2013-04-29 ENCOUNTER — Encounter (HOSPITAL_COMMUNITY): Payer: Self-pay | Admitting: *Deleted

## 2013-04-29 DIAGNOSIS — N390 Urinary tract infection, site not specified: Secondary | ICD-10-CM | POA: Insufficient documentation

## 2013-04-29 DIAGNOSIS — R300 Dysuria: Secondary | ICD-10-CM | POA: Insufficient documentation

## 2013-04-29 HISTORY — DX: Anemia, unspecified: D64.9

## 2013-04-29 LAB — URINE MICROSCOPIC-ADD ON

## 2013-04-29 LAB — URINALYSIS, ROUTINE W REFLEX MICROSCOPIC
Bilirubin Urine: NEGATIVE
Nitrite: POSITIVE — AB
Specific Gravity, Urine: >1.03 — ABNORMAL HIGH (ref 1.005–1.030)
pH: 6 (ref 5.0–8.0)

## 2013-04-29 MED ORDER — PHENAZOPYRIDINE HCL 100 MG PO TABS: 100.0000 mg | ORAL_TABLET | Freq: Three times a day (TID) | ORAL | Status: DC | PRN

## 2013-04-29 MED ORDER — CIPROFLOXACIN HCL 500 MG PO TABS
500.0000 mg | ORAL_TABLET | Freq: Once | ORAL | Status: AC
  Administered 2013-04-29: 500 mg via ORAL
  Filled 2013-04-29: qty 1

## 2013-04-29 MED ORDER — PHENAZOPYRIDINE HCL 100 MG PO TABS
100.0000 mg | ORAL_TABLET | Freq: Once | ORAL | Status: AC
  Administered 2013-04-29: 100 mg via ORAL
  Filled 2013-04-29: qty 1

## 2013-04-29 MED ORDER — CIPROFLOXACIN HCL 500 MG PO TABS: 500.0000 mg | ORAL_TABLET | Freq: Two times a day (BID) | ORAL | Status: DC

## 2013-04-29 NOTE — MAU Note (Signed)
Pt states she started experiencing smelly urine for several days and today more urgency and pain with urinating.  Pt feels weak.

## 2013-04-29 NOTE — MAU Provider Note (Signed)
History     CSN: [id number]  Arrival date and time: 04/29/13 2232   First Provider Initiated Contact with Patient 04/29/13 2301      Chief Complaint  Patient presents with  . Urinary Frequency   HPI  Christy [name] is a 32 y.o. who presents with pain with with urination and odor to urine for one day. She denies any vaginal discharge, and states that she had STD testing recently.   Past Medical History  Diagnosis Date  . Anemia     Past Surgical History  Procedure Laterality Date  . No past surgeries      History reviewed. No pertinent family history.  History  Substance Use Topics  . Smoking status: Never Smoker   . Smokeless tobacco: Not on file  . Alcohol Use: No    Allergies:  Allergies  Allergen Reactions  . Diflucan [Fluconazole]     Mouth and vagina area breaks out into hives  . Sulfamethoxazole W-Trimethoprim     No prescriptions prior to admission    ROS Physical Exam   Blood pressure 127/75, pulse 89, temperature 98.4 F (36.9 C), temperature source Oral, resp. rate 18, height 5' 6.75" (1.695 m), weight 84.006 kg (185 lb 3.2 oz), last menstrual period 04/15/2013, SpO2 100.00%.  Physical Exam  Nursing note and vitals reviewed. Constitutional: She is oriented to person, place, and time. She appears well-developed and well-nourished. No distress.  Cardiovascular: Normal rate.   Respiratory: Effort normal.  GI: Soft. There is no tenderness.  Genitourinary:  Pt refuses pelvic exam  Neurological: She is alert and oriented to person, place, and time.  Skin: Skin is warm and dry.  Psychiatric: She has a normal mood and affect.    MAU Course  Procedures  Results for orders placed during the hospital encounter of 04/29/13 (from the past 24 hour(s))  URINALYSIS, ROUTINE W REFLEX MICROSCOPIC     Status: Abnormal   Collection Time    04/29/13 10:40 PM      Result Value Range   Color, Urine YELLOW  YELLOW   APPearance CLEAR  CLEAR   Specific  Gravity, Urine >1.030 (*) 1.005 - 1.030   pH 6.0  5.0 - 8.0   Glucose, UA NEGATIVE  NEGATIVE mg/dL   Hgb urine dipstick SMALL (*) NEGATIVE   Bilirubin Urine NEGATIVE  NEGATIVE   Ketones, ur 15 (*) NEGATIVE mg/dL   Protein, ur NEGATIVE  NEGATIVE mg/dL   Urobilinogen, UA 1.0  0.0 - 1.0 mg/dL   Nitrite POSITIVE (*) NEGATIVE   Leukocytes, UA SMALL (*) NEGATIVE  URINE MICROSCOPIC-ADD ON     Status: Abnormal   Collection Time    04/29/13 10:40 PM      Result Value Range   Squamous Epithelial / LPF RARE  RARE   WBC, UA 3-6  <3 WBC/hpf   RBC / HPF 3-6  <3 RBC/hpf   Bacteria, UA MANY (*) RARE   Urine-Other MUCOUS PRESENT    POCT PREGNANCY, URINE     Status: None   Collection Time    04/29/13 10:46 PM      Result Value Range   Preg Test, Ur NEGATIVE  NEGATIVE     Assessment and Plan   1. UTI (lower urinary tract infection)      Medication List         ciprofloxacin 500 MG tablet  Commonly known as:  CIPRO  Take 1 tablet (500 mg total) by mouth 2 (two) times daily.  phenazopyridine 100 MG tablet  Commonly known as:  PYRIDIUM  Take 1 tablet (100 mg total) by mouth 3 (three) times daily as needed for pain.       Return to MAU as needed   Tawnya Crook 04/29/2013, 11:02 PM

## 2013-05-01 NOTE — MAU Provider Note (Signed)
Attestation of Attending Supervision of Advanced Practitioner (CNM/NP): Evaluation and management procedures were performed by the Advanced Practitioner under my supervision and collaboration.  I have reviewed the Advanced Practitioner's note and chart, and I agree with the management and plan.  Sael Furches 05/01/2013 10:30 AM   

## 2013-05-02 LAB — URINE CULTURE: Colony Count: >=100000

## 2013-05-17 ENCOUNTER — Emergency Department (HOSPITAL_COMMUNITY)
Admission: EM | Admit: 2013-05-17 | Discharge: 2013-05-17 | Disposition: A | Discharge status: Home / Self Care | Payer: Medicaid Other | Attending: Emergency Medicine | Admitting: Emergency Medicine

## 2013-05-17 ENCOUNTER — Encounter (HOSPITAL_COMMUNITY): Payer: Self-pay | Admitting: Emergency Medicine

## 2013-05-17 DIAGNOSIS — Z862 Personal history of diseases of the blood and blood-forming organs and certain disorders involving the immune mechanism: Secondary | ICD-10-CM | POA: Insufficient documentation

## 2013-05-17 DIAGNOSIS — H65491 Other chronic nonsuppurative otitis media, right ear: Secondary | ICD-10-CM

## 2013-05-17 DIAGNOSIS — H748X9 Other specified disorders of middle ear and mastoid, unspecified ear: Secondary | ICD-10-CM | POA: Insufficient documentation

## 2013-05-17 DIAGNOSIS — Z792 Long term (current) use of antibiotics: Secondary | ICD-10-CM | POA: Insufficient documentation

## 2013-05-17 MED ORDER — AMOXICILLIN 500 MG PO CAPS: 500.0000 mg | ORAL_CAPSULE | Freq: Three times a day (TID) | ORAL | Status: DC

## 2013-05-17 MED ORDER — GUAIFENESIN ER 600 MG PO TB12: 1200.0000 mg | ORAL_TABLET | Freq: Two times a day (BID) | ORAL | Status: DC

## 2013-05-17 MED ORDER — CETIRIZINE-PSEUDOEPHEDRINE ER 5-120 MG PO TB12: 1.0000 | ORAL_TABLET | Freq: Two times a day (BID) | ORAL | Status: DC

## 2013-05-17 MED ORDER — FLUTICASONE PROPIONATE 50 MCG/ACT NA SUSP: 2.0000 | Freq: Every day | NASAL | Status: DC

## 2013-05-17 NOTE — ED Notes (Signed)
C/o R earache, 10/10, (denies: dizziness, nausea, change in vision, fever, nvd, hearing change), worse when lying down, mentions having scuba dived in the past, also has seen an ENT. Has tried Tylenol. "feels like someone has hit me with a bat behind R ear", alert, NAD, calm, steady gait.

## 2013-05-17 NOTE — ED Provider Notes (Signed)
Medical screening examination/treatment/procedure(s) were performed by non-physician practitioner and as supervising physician I was immediately available for consultation/collaboration.   Brandt Loosen, MD 05/17/13 [phone]

## 2013-05-17 NOTE — ED Notes (Signed)
Henrene Dodge, PA at bedside.

## 2013-05-17 NOTE — ED Provider Notes (Signed)
CSN: [id number]     Arrival date & time 05/17/13  0865 History   First MD Initiated Contact with Patient 05/17/13 0251     Chief Complaint  Patient presents with  . Otalgia   HPI  History provided by the patient. The patient is a 32 year old female with no significant PMH who presents with complaints of persistent pain and pressure around her right ear and head. Patient states that she has felt a pressure on the inside of her right ear for the past one month or more. She states symptoms seem to person become a problem after returning home from a trip to the [place]. She did scuba dive during this vacation. Patient attempted to clean her ear several times. Patient also reports being seen by me and he specialist and was told there were no signs of infection or any concerning problems in her external ear area. The symptoms do wax and wane slightly. She was instructed to use methods of clearing her ears and she states that this occasionally helped especially with opening her jaw. Recently she has not had any significant relief with any treatments. She has not used any medications for her symptoms. She does also report worsening of symptoms at night when laying down. They have been associated with slight dullness of hearing. She denies any complete hearing loss. Denies any dizziness or vertigo symptoms. Denies any nasal congestion or sinus pressures. No other headache symptoms.    Past Medical History  Diagnosis Date  . Anemia    Past Surgical History  Procedure Laterality Date  . No past surgeries     No family history on file. History  Substance Use Topics  . Smoking status: Never Smoker   . Smokeless tobacco: Not on file  . Alcohol Use: No   OB History   Grav Para Term Preterm Abortions TAB SAB Ect Mult Living   7 6   1  1   6      Review of Systems  Constitutional: Negative for fever.  HENT: Positive for ear pain and hearing loss. Negative for congestion, ear discharge, sinus  pressure and sore throat.   Respiratory: Negative for cough.   All other systems reviewed and are negative.    Allergies  Diflucan and Sulfamethoxazole-trimethoprim  Home Medications   Current Outpatient Rx  Name  Route  Sig  Dispense  Refill  . ciprofloxacin (CIPRO) 500 MG tablet   Oral   Take 1 tablet (500 mg total) by mouth 2 (two) times daily.   9 tablet   0   . phenazopyridine (PYRIDIUM) 100 MG tablet   Oral   Take 1 tablet (100 mg total) by mouth 3 (three) times daily as needed for pain.   10 tablet   0    LMP 05/03/2013 Physical Exam  Nursing note and vitals reviewed. Constitutional: She is oriented to person, place, and time. She appears well-developed and well-nourished. No distress.  HENT:  Head: Normocephalic and atraumatic.  Right Ear: No drainage or swelling. No mastoid tenderness. Tympanic membrane is bulging. Tympanic membrane is not injected and not perforated. A middle ear effusion is present. Decreased hearing is noted.  Left Ear: Tympanic membrane normal.  Mouth/Throat: Oropharynx is clear and moist.  There is slight bulging and dullness of the right TM. There appears to be some purulence behind the TM.  Eyes: Conjunctivae and EOM are normal. Pupils are equal, round, and reactive to light.  Neck: Normal range of motion. Neck supple.  No meningeal signs  Cardiovascular: Normal rate and regular rhythm.   No murmur heard. Pulmonary/Chest: Effort normal and breath sounds normal. No respiratory distress. She has no wheezes. She has no rales.  Abdominal: Soft. There is no tenderness. There is no rigidity, no rebound, no guarding, no CVA tenderness and no tenderness at McBurney's point.  Musculoskeletal: Normal range of motion.  Lymphadenopathy:    She has no cervical adenopathy.  Neurological: She is alert and oriented to person, place, and time. She has normal strength. No cranial nerve deficit or sensory deficit. Gait normal.  Skin: Skin is warm and dry.  No rash noted.  Psychiatric: She has a normal mood and affect. Her behavior is normal.    ED Course  Procedures    COORDINATION OF CARE:  Nursing notes reviewed. Vital signs reviewed. Initial pt interview and examination performed.   The patient did express some concerns for some type of rare tumor or other condition causing her pressure. She has normal nonfocal neuro exam. She does appear to have effusion with bulging and dullness right TM. No significant erythema. This time do not suspect any concerning intracranial process causing her symptoms.  I have discussed treatment plan with pt at bedside, which includes antibiotics and decongestants. At this time I have not recommended emergent CT scan or imaging of her head and brain. Pt agrees with plan.        MDM   1. Chronic middle ear effusion, right        Angus Seller, PA-C 05/17/13 [phone]

## 2013-06-04 ENCOUNTER — Telehealth (HOSPITAL_COMMUNITY): Payer: Self-pay

## 2013-06-04 NOTE — ED Notes (Signed)
Call from pt wanting to let ED provider she saw that the medications weren't working any more.  Asked pt if she had followed up w/ENT as directed and she stated no she didn't have any insurance.  Instructed pt to be reseen or f/u w/ENT as directed.

## 2013-06-29 NOTE — ED Notes (Signed)
Patient called wanting to inform ED provider of medication. Advised her that ED MDs do not take calls and that if she was having any problems that she should f/u with PCP or to return to ED. Patient verbalized understanding.

## 2013-07-06 ENCOUNTER — Inpatient Hospital Stay (HOSPITAL_COMMUNITY)
Admission: AD | Admit: 2013-07-06 | Discharge: 2013-07-06 | Disposition: A | Discharge status: Home / Self Care | Payer: Medicaid Other | Source: Ambulatory Visit | Attending: Obstetrics and Gynecology | Admitting: Obstetrics and Gynecology

## 2013-07-06 ENCOUNTER — Encounter (HOSPITAL_COMMUNITY): Payer: Self-pay | Admitting: *Deleted

## 2013-07-06 ENCOUNTER — Inpatient Hospital Stay (HOSPITAL_COMMUNITY): Payer: Self-pay

## 2013-07-06 DIAGNOSIS — Z30431 Encounter for routine checking of intrauterine contraceptive device: Secondary | ICD-10-CM | POA: Insufficient documentation

## 2013-07-06 DIAGNOSIS — R102 Pelvic and perineal pain: Secondary | ICD-10-CM

## 2013-07-06 DIAGNOSIS — N949 Unspecified condition associated with female genital organs and menstrual cycle: Secondary | ICD-10-CM | POA: Insufficient documentation

## 2013-07-06 DIAGNOSIS — R109 Unspecified abdominal pain: Secondary | ICD-10-CM | POA: Insufficient documentation

## 2013-07-06 LAB — CBC
MCH: 30.5 pg (ref 26.0–34.0)
MCHC: 34.6 g/dL (ref 30.0–36.0)
Platelets: 252 10*3/uL (ref 150–400)

## 2013-07-06 LAB — URINALYSIS, ROUTINE W REFLEX MICROSCOPIC
Ketones, ur: NEGATIVE mg/dL
Leukocytes, UA: NEGATIVE
Nitrite: NEGATIVE
Protein, ur: NEGATIVE mg/dL
Urobilinogen, UA: 0.2 mg/dL (ref 0.0–1.0)

## 2013-07-06 NOTE — MAU Provider Note (Signed)
History     CSN: [id number]  Arrival date and time: 07/06/13 4098   First Provider Initiated Contact with Patient 07/06/13 [phone]      Chief Complaint  Patient presents with  . Abdominal Pain   HPI  [name] is 32 y.o. female [phone] non-pregnant female who presents to MAU with abdominal pain; mores specifically in her lower, mid abdominal area. The pain is stabbing and is worse when she moves. The pain was so severe this morning that she called 911.  She has an IUD that has been in for 2.5 years, she has never been able to check her strings, however 3 months ago she went to her Dr. Who saw and felt the IUD strings. She took at 800 mg ibuprofen prior to coming in today and her pain is minimal at this time.  She says it feels like pain after she had a baby where she passed a large blood clot.   OB History   Grav Para Term Preterm Abortions TAB SAB Ect Mult Living   7 6 6  0 1 0 1 0 0 6      Past Medical History  Diagnosis Date  . Anemia     Past Surgical History  Procedure Laterality Date  . No past surgeries      History reviewed. No pertinent family history.  History  Substance Use Topics  . Smoking status: Never Smoker   . Smokeless tobacco: Not on file  . Alcohol Use: No    Allergies:  Allergies  Allergen Reactions  . Diflucan [Fluconazole]     Mouth and vagina area breaks out into hives  . Sulfamethoxazole-Trimethoprim     No prescriptions prior to admission   Results for orders placed during the hospital encounter of 07/06/13 (from the past 24 hour(s))  URINALYSIS, ROUTINE W REFLEX MICROSCOPIC     Status: Abnormal   Collection Time    07/06/13  8:40 AM      Result Value Range   Color, Urine YELLOW  YELLOW   APPearance CLEAR  CLEAR   Specific Gravity, Urine >1.030 (*) 1.005 - 1.030   pH 6.0  5.0 - 8.0   Glucose, UA NEGATIVE  NEGATIVE mg/dL   Hgb urine dipstick NEGATIVE  NEGATIVE   Bilirubin Urine NEGATIVE  NEGATIVE   Ketones, ur NEGATIVE   NEGATIVE mg/dL   Protein, ur NEGATIVE  NEGATIVE mg/dL   Urobilinogen, UA 0.2  0.0 - 1.0 mg/dL   Nitrite NEGATIVE  NEGATIVE   Leukocytes, UA NEGATIVE  NEGATIVE  POCT PREGNANCY, URINE     Status: None   Collection Time    07/06/13  8:53 AM      Result Value Range   Preg Test, Ur NEGATIVE  NEGATIVE  CBC     Status: None   Collection Time    07/06/13 10:00 AM      Result Value Range   WBC 4.1  4.0 - 10.5 K/uL   RBC 4.20  3.87 - 5.11 MIL/uL   Hemoglobin 12.8  12.0 - 15.0 g/dL   HCT 21.3  08.6 - 57.8 %   MCV 88.1  78.0 - 100.0 fL   MCH 30.5  26.0 - 34.0 pg   MCHC 34.6  30.0 - 36.0 g/dL   RDW 46.9  62.9 - 52.8 %   Platelets 252  150 - 400 K/uL  WET PREP, GENITAL     Status: Abnormal   Collection Time    07/06/13 10:00  AM      Result Value Range   Yeast Wet Prep HPF POC NONE SEEN  NONE SEEN   Trich, Wet Prep NONE SEEN  NONE SEEN   Clue Cells Wet Prep HPF POC NONE SEEN  NONE SEEN   WBC, Wet Prep HPF POC FEW (*) NONE SEEN    Review of Systems  Constitutional: Negative for fever and chills.  Gastrointestinal: Positive for nausea and abdominal pain. Negative for diarrhea and constipation.  Genitourinary: Negative for dysuria, urgency, frequency and hematuria.       No vaginal discharge. No vaginal bleeding. No dysuria.   Musculoskeletal: Positive for back pain.  Neurological: Negative for headaches.   Physical Exam   Blood pressure 112/64, pulse 83, temperature 96.9 F (36.1 C), resp. rate 18, last menstrual period 06/15/2013.  Physical Exam  Constitutional: She is oriented to person, place, and time. She appears well-developed and well-nourished. No distress.  HENT:  Head: Normocephalic.  Eyes: Pupils are equal, round, and reactive to light.  Neck: Neck supple.  Respiratory: Effort normal.  GI: Soft. She exhibits no distension. There is tenderness. There is no rebound and no guarding.  + suprapubic tenderness   Genitourinary: Vaginal discharge found.  Speculum  exam: Vagina - Large amount of pale yellow, mucus like discharge, no odor Cervix - No contact bleeding Bimanual exam: Cervix closed, no CMT  IUD strings visualized and felt on bimanual exam  Uterus non tender, normal size Adnexa non tender, no masses bilaterally, + suprapubic tenderness  GC/Chlam, wet prep done Chaperone present for exam.   Musculoskeletal: Normal range of motion.  Neurological: She is alert and oriented to person, place, and time.  Skin: Skin is warm. She is not diaphoretic.  Psychiatric: Her behavior is normal.   Results for orders placed during the hospital encounter of 07/06/13 (from the past 24 hour(s))  URINALYSIS, ROUTINE W REFLEX MICROSCOPIC     Status: Abnormal   Collection Time    07/06/13  8:40 AM      Result Value Range   Color, Urine YELLOW  YELLOW   APPearance CLEAR  CLEAR   Specific Gravity, Urine >1.030 (*) 1.005 - 1.030   pH 6.0  5.0 - 8.0   Glucose, UA NEGATIVE  NEGATIVE mg/dL   Hgb urine dipstick NEGATIVE  NEGATIVE   Bilirubin Urine NEGATIVE  NEGATIVE   Ketones, ur NEGATIVE  NEGATIVE mg/dL   Protein, ur NEGATIVE  NEGATIVE mg/dL   Urobilinogen, UA 0.2  0.0 - 1.0 mg/dL   Nitrite NEGATIVE  NEGATIVE   Leukocytes, UA NEGATIVE  NEGATIVE  POCT PREGNANCY, URINE     Status: None   Collection Time    07/06/13  8:53 AM      Result Value Range   Preg Test, Ur NEGATIVE  NEGATIVE  CBC     Status: None   Collection Time    07/06/13 10:00 AM      Result Value Range   WBC 4.1  4.0 - 10.5 K/uL   RBC 4.20  3.87 - 5.11 MIL/uL   Hemoglobin 12.8  12.0 - 15.0 g/dL   HCT 16.1  09.6 - 04.5 %   MCV 88.1  78.0 - 100.0 fL   MCH 30.5  26.0 - 34.0 pg   MCHC 34.6  30.0 - 36.0 g/dL   RDW 40.9  81.1 - 91.4 %   Platelets 252  150 - 400 K/uL  WET PREP, GENITAL     Status: Abnormal  Collection Time    07/06/13 10:00 AM      Result Value Range   Yeast Wet Prep HPF POC NONE SEEN  NONE SEEN   Trich, Wet Prep NONE SEEN  NONE SEEN   Clue Cells Wet Prep HPF POC  NONE SEEN  NONE SEEN   WBC, Wet Prep HPF POC FEW (*) NONE SEEN    MAU Course  Procedures None  MDM UA UPT Wet prep GC/Chlamydia Pelvic US Patient currently rates her pain 2/10; following 800 of ibuprofen taken at home. She denies the need for pain medication at this time.   Assessment and Plan   Report given to [facility] who resumes care of the patient  1220 - Patient in [place]. Care assumed from Venia Carbon, NP  Joseph [facility]kshire N. NP  07/06/2013, 2:05 PM   US Transvaginal Non-ob  07/06/2013   CLINICAL DATA:  Severe lower abdominal pain.  Intrauterine device.  EXAM: TRANSVAGINAL ULTRASOUND OF PELVIS  TECHNIQUE: Transvaginal ultrasound examination of the pelvis was performed including evaluation of the uterus, ovaries, adnexal regions, and pelvic cul-de-sac.  COMPARISON:  Obstetric ultrasound 09/02/2010  FINDINGS: Uterus  Measurements: Anteverted and measures 9.3 x 4.2 x 5.5 cm. Within normal limits for echotexture. No focal uterine masses identified. No fibroids or other mass visualized.  Endometrium  Thickness: There is a small amount of simple appearing fluid in the endometrial canal. An intrauterine device appears appropriately positioned within the endometrial canal. Double wall thickness of the endometrium is 3 mm.  Right ovary  Measurements: The right ovary measures 5.4 x 2.2 x 4.2 cm and contains a possible probable collapsing cyst, oblong in shape, that measures 2.9 x 0.8 x 2.0 cm.  Left ovary  Measurements: Measures 4.0 x 3.7 x 2.3 cm and contains a dominant follicle measuring 2.1 x 2.6 x 2.0 cm. No adnexal mass.  Other findings: Color Doppler flow seen to both ovaries. There is a moderate amount of free fluid in the pelvis.  IMPRESSION: 1. Moderate amount of free fluid in the pelvis. Tere is an oblong probable collapsed cyst in the right ovary that measures 2.9 cm greatest diameter. Question if this could be a cyst that is recently ruptured. 2. Satisfactory position of  intrauterine device. 3. Normal appearance of the uterus.   Electronically Signed   By: Britta Mccreedy M.D.   On: 07/06/2013 13:25    MDM Preliminary [place] reports shows possible simple ovarian cysts.  Patient states that she was a patient of [facility]. She has not seen then in ~ 1.5 years. Plans to follow-up with them if symptoms persist.  A: Pelvic pain  P: Discharge home Patient advised to continue ibuprofen PRN pain Patient encouraged to follow-up with GYN provider of choice Patient may return to MAU as needed or if her condition were to change or worsen  Freddi Starr, PA-C 07/06/2013 2:05 PM

## 2013-07-06 NOTE — MAU Note (Signed)
Pt presents with complaints of lower abdominal pain that started on Thursday.  She describes the pain as needles in her abdominal.She states that she does have an IUD and it was placed 2 1/2 years ago.

## 2013-07-08 LAB — GC/CHLAMYDIA PROBE AMP: CT Probe RNA: NEGATIVE

## 2014-05-10 ENCOUNTER — Encounter (HOSPITAL_COMMUNITY): Payer: Self-pay | Admitting: Emergency Medicine

## 2014-05-10 ENCOUNTER — Emergency Department (HOSPITAL_COMMUNITY): Payer: Medicaid Other

## 2014-05-10 ENCOUNTER — Emergency Department (HOSPITAL_COMMUNITY)
Admission: EM | Admit: 2014-05-10 | Discharge: 2014-05-10 | Disposition: A | Discharge status: Home / Self Care | Payer: Medicaid Other | Attending: Emergency Medicine | Admitting: Emergency Medicine

## 2014-05-10 DIAGNOSIS — R05 Cough: Secondary | ICD-10-CM | POA: Diagnosis present

## 2014-05-10 DIAGNOSIS — Z7982 Long term (current) use of aspirin: Secondary | ICD-10-CM | POA: Diagnosis not present

## 2014-05-10 DIAGNOSIS — Z792 Long term (current) use of antibiotics: Secondary | ICD-10-CM | POA: Insufficient documentation

## 2014-05-10 DIAGNOSIS — K088 Other specified disorders of teeth and supporting structures: Secondary | ICD-10-CM | POA: Insufficient documentation

## 2014-05-10 DIAGNOSIS — R0602 Shortness of breath: Secondary | ICD-10-CM | POA: Diagnosis not present

## 2014-05-10 DIAGNOSIS — Z862 Personal history of diseases of the blood and blood-forming organs and certain disorders involving the immune mechanism: Secondary | ICD-10-CM | POA: Insufficient documentation

## 2014-05-10 DIAGNOSIS — R079 Chest pain, unspecified: Secondary | ICD-10-CM | POA: Diagnosis not present

## 2014-05-10 NOTE — ED Notes (Signed)
Pt denies any cough or cold sx. C/o constant chest pressure that is varying in intensities with left shoulder ache x 1 month. Sob with minimal exertion. Also c/o toothache that began this week and correlates with the increase in severity of her chest pain. Sharp stabbing left sided chest pain while resting tonight and only lasting a few seconds. Pt has recently finished an antibiotic for suspected tooth infection and it has not relieved the pain.  Family hx: heart illness

## 2014-05-10 NOTE — Discharge Instructions (Signed)

## 2014-05-10 NOTE — ED Notes (Signed)
The pt is c/o a cold cough non-productive forf one molnth.  She also has had some indigestion and lt shoulder soreness.  Tonight the pain increased and she decided to have it checked

## 2014-05-10 NOTE — ED Provider Notes (Signed)
CSN: [id number]     Arrival date & time 05/10/14  0150 History   First MD Initiated Contact with Patient 05/10/14 [phone]     Chief Complaint  Patient presents with  . Cough     Patient is a 33 y.o. female presenting with chest pain. The history is provided by the patient.  Chest Pain Pain location:  L chest, substernal area and L lateral chest Pain quality: burning, sharp and tightness   Duration:  30 days Timing:  Intermittent Chronicity:  Recurrent Relieved by:  Nothing Worsened by:  Nothing tried Associated symptoms: shortness of breath   Associated symptoms: no cough, no fever, no lower extremity edema, no syncope, not vomiting and no weakness   Associated symptoms comment:  Dental pain  Risk factors: no coronary artery disease, no prior DVT/PE and no smoking   Pt reports she has been having chest pain symptoms for up to 30 days She reports she will have episode of chest burning, left sided chest pain that is sharp and also chest tightness.  It occurs in left chest, lateral chest and also substernal region.  No pleuritic type CP.  The CP occurs at random and not predictable. Tonight she had episode of sharp CP while lying in bed and she wanted to be "checked out" She reports she feels SOB - she feels she loses her breath while talking.  This has been ongoing for 30 days No exertional CP She has no CP at this time She feels it is related to recent dental infection (she has completed antibiotics)  She denies h/o CAD/DVT/PE  Pt denied cough on my evaluation   Past Medical History  Diagnosis Date  . Anemia    Past Surgical History  Procedure Laterality Date  . No past surgeries     No family history on file. History  Substance Use Topics  . Smoking status: Never Smoker   . Smokeless tobacco: Not on file  . Alcohol Use: No   OB History   Grav Para Term Preterm Abortions TAB SAB Ect Mult Living   7 6 6  0 1 0 1 0 0 6     Review of Systems  Constitutional: Negative  for fever.  HENT: Positive for dental problem.   Respiratory: Positive for shortness of breath. Negative for cough.   Cardiovascular: Positive for chest pain. Negative for syncope.  Gastrointestinal: Negative for vomiting.  Neurological: Negative for weakness.  All other systems reviewed and are negative.     Allergies  Diflucan and Sulfamethoxazole-trimethoprim  Home Medications   Prior to Admission medications   Medication Sig Start Date End Date Taking? Authorizing Provider  amoxicillin-clavulanate (AUGMENTIN) 875-125 MG per tablet Take 1 tablet by mouth 2 (two) times daily.   Yes Historical Provider, MD  aspirin-acetaminophen-caffeine (EXCEDRIN MIGRAINE) [phone] MG per tablet Take 2 tablets by mouth 2 (two) times daily as needed for headache.   Yes Historical Provider, MD   BP 125/73  Pulse 80  Temp(Src) 98.2 F (36.8 C) (Oral)  Resp 23  SpO2 99%  LMP 05/10/2014 Physical Exam CONSTITUTIONAL: Well developed/well nourished HEAD: Normocephalic/atraumatic EYES: EOMI/PERRL ENMT: Mucous membranes moistm, no dental abscess noted NECK: supple no meningeal signs SPINE:entire spine nontender CV: S1/S2 noted, no murmurs/rubs/gallops noted LUNGS: Lungs are clear to auscultation bilaterally, no apparent distress Chest - no tenderness noted ABDOMEN: soft, nontender, no rebound or guarding GU:no cva tenderness NEURO: Pt is awake/alert, moves all extremitiesx4 EXTREMITIES: pulses normal, full ROM SKIN: warm, color normal  PSYCH: no abnormalities of mood noted  ED Course  Procedures   Pt with variety of intermittent CP for past month, none at this time.  She is low risk for ACS/PE/dissection.   No hypoxia noted, no tachypnea noted I feel she is appropriate for discharge home Imaging Review Dg Chest 2 View  05/10/2014   CLINICAL DATA:  Constant chest pressure ovarian intensity. Left shoulder pain for 1 month. Shortness of breath with minimal exertion. Toothache. Sharp  stabbing left-sided chest pain.  EXAM: CHEST  2 VIEW  COMPARISON:  08/15/2008  FINDINGS: The heart size and mediastinal contours are within normal limits. Both lungs are clear. The visualized skeletal structures are unremarkable.  IMPRESSION: No active cardiopulmonary disease.   Electronically Signed   By: Lucienne Capers M.D.   On: 05/10/2014 02:42     EKG Interpretation   Date/Time:  Saturday May 10 2014 01:57:12 EDT Ventricular Rate:  81 PR Interval:  162 QRS Duration: 78 QT Interval:  388 QTC Calculation: 450 R Axis:   14 Text Interpretation:  Normal sinus rhythm Low voltage QRS Cannot rule out  Anterior infarct , age undetermined Abnormal ECG No significant change  since last tracing Confirmed by Christy Gentles  MD, [facility] ([id number]) on 05/10/2014  2:44:32 AM      MDM   Final diagnoses:  Chest pain, unspecified chest pain type    Nursing notes including past medical history and social history reviewed and considered in documentation xrays reviewed and considered     Sharyon Cable, MD 05/10/14 [phone]

## 2014-06-09 ENCOUNTER — Encounter (HOSPITAL_COMMUNITY): Payer: Self-pay | Admitting: Emergency Medicine

## 2014-07-04 ENCOUNTER — Telehealth: Payer: Self-pay

## 2014-07-04 NOTE — Telephone Encounter (Signed)
Received call from patient.She stated she has been having sharp chest pain off and on for 1 1/2 months.Also complains of sob when she walks up stairs. No chest pain at present.Stated she would like appointment.Scheduler will schedule appointment.

## 2014-07-22 ENCOUNTER — Encounter: Payer: Self-pay | Admitting: Cardiology

## 2014-07-22 ENCOUNTER — Ambulatory Visit (INDEPENDENT_AMBULATORY_CARE_PROVIDER_SITE_OTHER): Payer: Self-pay | Admitting: Cardiology

## 2014-07-22 VITALS — BP 114/78 | HR 76 | Ht 66.75 in | Wt 191.0 lb

## 2014-07-22 DIAGNOSIS — R079 Chest pain, unspecified: Secondary | ICD-10-CM

## 2014-07-22 DIAGNOSIS — Z8249 Family history of ischemic heart disease and other diseases of the circulatory system: Secondary | ICD-10-CM

## 2014-07-22 DIAGNOSIS — R0600 Dyspnea, unspecified: Secondary | ICD-10-CM | POA: Insufficient documentation

## 2014-07-22 NOTE — Progress Notes (Signed)
[facility] [address], [address] [place], [place]  [address] Phone: [phone] Fax:  [phone]  Date:  07/22/2014   ID:  Rosaline Ezekiel, DOB Apr 14, 1981, MRN [id number]  PCP:  No PCP Per Patient   History of Present Illness: Christy [name] is a 33 y.o. female here for the evaluation of chest pain. She was in the emergency department on 05/10/14 with complaints of left-sided substernal left lateral chest burning, sharp, tight chest pain that has been intermittent over the past 30 days. Feeling short of breath. No associated fevers, cough, syncope, vomiting. She has no prior history of coronary artery disease, PE. The pain seems to occur at random and is not predictable. When she was laying in bed she felt a sharp chest discomfort and desired to be "checked out "in the emergency room. She feels as if she may lose her breath when walking. She did have a recent dental infection for which she completed antibiotics.  Does not feel like heart burn to her (like prior 6 pregnancies).[place] Scrape like she needs to burp over left breast. Like a running cramp. Feels out of breath. She states that she was taking quite a bit of ibuprofen during her tooth pain.  Chest x-ray demonstrated no active disease. Normal. EKG shows sinus rhythm   Wt Readings from Last 3 Encounters:  07/22/14 191 lb (86.637 kg)  04/29/13 185 lb 3.2 oz (84.006 kg)  08/31/10 183 lb (83.008 kg)     Past Medical History  Diagnosis Date  . Anemia     Past Surgical History  Procedure Laterality Date  . No past surgeries      No current outpatient prescriptions on file.   No current facility-administered medications for this visit.    Allergies:    Allergies  Allergen Reactions  . Diflucan [Fluconazole]     Mouth and vagina area breaks out into hives  . Sulfamethoxazole-Trimethoprim Hives    Social History:  The patient  reports that she has never smoked. She does not have any smokeless tobacco history on file. She  reports that she does not drink alcohol or use illicit drugs. Husband smoke.   Father was late 39's with MI.  ROS:  Please see the history of present illness.   Denies any fevers, chills, orthopnea, PND. Positive recent dental infection.   All other systems reviewed and negative.   PHYSICAL EXAM: VS:  BP 114/78 mmHg  Pulse 76  Ht 5' 6.75" (1.695 m)  Wt 191 lb (86.637 kg)  BMI 30.16 kg/m2 Well nourished, well developed, in no acute distress HEENT: normal, [place]/AT, EOMI Neck: no JVD, normal carotid upstroke, no bruit Cardiac:  normal S1, S2; RRR; no murmur Lungs:  clear to auscultation bilaterally, no wheezing, rhonchi or rales Abd: soft, nontender, no hepatomegaly, no bruits Ext: no edema, 2+ distal pulses Skin: warm and dry GU: deferred Neuro: no focal abnormalities noted, AAO x 3  EKG:  07/22/14-sinus rhythm, 76, relatively low-voltage, normal R-wave progression on this EKG. Previous 05/10/14-sinus rhythm, nonspecific ST-T wave changes, poor R-wave progression.  CT scan of chest on 08/15/2008-personally reviewed, no coronary calcium  Chest x-ray 05/10/14-no disease. Normal.  Lab work-unremarkable.  ASSESSMENT AND PLAN:  1. Atypical chest pain-intermittent, possibilities include musculoskeletal, GI, cardiac symptoms less likely. We will go ahead and set up for echo especially with her sensation of shortness of breath. She has recently been walking without any difficulty or chest discomfort. My suspicion is that surrounding her  dental, tooth pain she was taking a fair amount of ibuprofen and this lead to gastritis which was causing her chest discomfort. Continue to exercise, diet, primary prevention. 2. We will follow-up with echocardiogram. Dyspnea-could be deconditioning. Checking echocardiogram. 3. When necessary follow-up.  Signed, Candee Furbish, MD [employer]  07/22/2014 8:39 AM

## 2014-07-22 NOTE — Patient Instructions (Addendum)
The current medical regimen is effective;  continue present plan and medications.  Your physician has requested that you have an echocardiogram. Echocardiography is a painless test that uses sound waves to create images of your heart. It provides your doctor with information about the size and shape of your heart and how well your heart's chambers and valves are working. This procedure takes approximately one hour. There are no restrictions for this procedure.  Follow up as needed.  Thank you for choosing [facility]!!     

## 2014-07-24 ENCOUNTER — Other Ambulatory Visit (HOSPITAL_COMMUNITY): Payer: Self-pay

## 2014-07-30 ENCOUNTER — Other Ambulatory Visit (HOSPITAL_COMMUNITY): Payer: Self-pay

## 2014-07-31 ENCOUNTER — Ambulatory Visit (HOSPITAL_COMMUNITY): Payer: Self-pay | Attending: Cardiology | Admitting: Radiology

## 2014-07-31 DIAGNOSIS — R079 Chest pain, unspecified: Secondary | ICD-10-CM | POA: Insufficient documentation

## 2014-07-31 DIAGNOSIS — R0600 Dyspnea, unspecified: Secondary | ICD-10-CM | POA: Insufficient documentation

## 2014-07-31 DIAGNOSIS — E669 Obesity, unspecified: Secondary | ICD-10-CM | POA: Insufficient documentation

## 2014-07-31 NOTE — Progress Notes (Signed)
Echocardiogram performed.  

## 2014-10-09 ENCOUNTER — Emergency Department (HOSPITAL_COMMUNITY)
Admission: EM | Admit: 2014-10-09 | Discharge: 2014-10-10 | Disposition: A | Discharge status: Home / Self Care | Payer: BLUE CROSS/BLUE SHIELD | Attending: Emergency Medicine | Admitting: Emergency Medicine

## 2014-10-09 ENCOUNTER — Encounter (HOSPITAL_COMMUNITY): Payer: Self-pay | Admitting: Emergency Medicine

## 2014-10-09 DIAGNOSIS — J029 Acute pharyngitis, unspecified: Secondary | ICD-10-CM | POA: Diagnosis not present

## 2014-10-09 DIAGNOSIS — Z862 Personal history of diseases of the blood and blood-forming organs and certain disorders involving the immune mechanism: Secondary | ICD-10-CM | POA: Diagnosis not present

## 2014-10-09 LAB — RAPID STREP SCREEN (MED CTR MEBANE ONLY): Streptococcus, Group A Screen (Direct): NEGATIVE

## 2014-10-09 NOTE — ED Notes (Signed)
Pt presents with a sore throat for the past 2 weeks.  Respirations e/u.

## 2014-10-10 MED ORDER — LIDOCAINE VISCOUS 2 % MT SOLN: 20.0000 mL | OROMUCOSAL | Status: DC | PRN

## 2014-10-10 MED ORDER — CETIRIZINE HCL 10 MG PO TABS: 10.0000 mg | ORAL_TABLET | Freq: Every day | ORAL | Status: DC

## 2014-10-10 MED ORDER — AMOXICILLIN 500 MG PO CAPS: 500.0000 mg | ORAL_CAPSULE | Freq: Three times a day (TID) | ORAL | Status: DC

## 2014-10-10 MED ORDER — CETIRIZINE HCL 5 MG/5ML PO SYRP
5.0000 mg | ORAL_SOLUTION | Freq: Once | ORAL | Status: AC
  Administered 2014-10-10: 5 mg via ORAL
  Filled 2014-10-10: qty 5

## 2014-10-10 MED ORDER — AMOXICILLIN 500 MG PO CAPS
500.0000 mg | ORAL_CAPSULE | Freq: Once | ORAL | Status: AC
  Administered 2014-10-10: 500 mg via ORAL
  Filled 2014-10-10: qty 1

## 2014-10-10 NOTE — ED Provider Notes (Signed)
CSN: [id number]     Arrival date & time 10/09/14  2252 History   First MD Initiated Contact with Patient 10/09/14 2300     Chief Complaint  Patient presents with  . Sore Throat     (Consider location/radiation/quality/duration/timing/severity/associated sxs/prior Treatment) HPI Comments: Patient is a 34 year old female who presents with a 2 week history of sore throat. Patient reports gradual onset and progressively worsening sharp, severe throat pain. The pain is constant and made worse with swallowing. The pain is localized to the patient's throat and equal on both sides. Nothing alleviates the pain. The patient has not tried anything for symptom relief. Patient reports associated subjective fever, cervical adenopathy, and non productive cough. Patient denies headache, visual changes, sinus congestion, difficulty breathing, chest pain, SOB, abdominal pain, NVD.      Past Medical History  Diagnosis Date  . Anemia    Past Surgical History  Procedure Laterality Date  . No past surgeries     No family history on file. History  Substance Use Topics  . Smoking status: Never Smoker   . Smokeless tobacco: Not on file  . Alcohol Use: No   OB History    Gravida Para Term Preterm AB TAB SAB Ectopic Multiple Living   7 6 6  0 1 0 1 0 0 6     Review of Systems  Constitutional: Negative for fever, chills and fatigue.  HENT: Positive for sore throat. Negative for trouble swallowing.   Eyes: Negative for visual disturbance.  Respiratory: Negative for shortness of breath.   Cardiovascular: Negative for chest pain and palpitations.  Gastrointestinal: Negative for nausea, vomiting, abdominal pain and diarrhea.  Genitourinary: Negative for dysuria and difficulty urinating.  Musculoskeletal: Negative for arthralgias and neck pain.  Skin: Negative for color change.  Neurological: Negative for dizziness and weakness.  Psychiatric/Behavioral: Negative for dysphoric mood.      Allergies   Diflucan and Sulfamethoxazole-trimethoprim  Home Medications   Prior to Admission medications   Not on File   BP 112/74 mmHg  Pulse 95  Temp(Src) 98 F (36.7 C) (Oral)  Resp 18  Ht 5\' 4"  (1.626 m)  Wt 181 lb (82.101 kg)  BMI 31.05 kg/m2  SpO2 98% Physical Exam  Constitutional: She is oriented to person, place, and time. She appears well-developed and well-nourished. No distress.  HENT:  Head: Normocephalic and atraumatic.  Mouth/Throat: No oropharyngeal exudate.  Posterior pharynx erythema with mild tonsillar edema. No exudate.   Eyes: Conjunctivae and EOM are normal.  Neck: Normal range of motion.  Cardiovascular: Normal rate and regular rhythm.  Exam reveals no gallop and no friction rub.   No murmur heard. Pulmonary/Chest: Effort normal and breath sounds normal. She has no wheezes. She has no rales. She exhibits no tenderness.  Abdominal: Soft. She exhibits no distension. There is no tenderness. There is no rebound.  Musculoskeletal: Normal range of motion.  Lymphadenopathy:    She has no cervical adenopathy.  Neurological: She is alert and oriented to person, place, and time. Coordination normal.  Speech is goal-oriented. Moves limbs without ataxia.   Skin: Skin is warm and dry.  Psychiatric: She has a normal mood and affect. Her behavior is normal.  Nursing note and vitals reviewed.   ED Course  Procedures (including critical care time) Labs Review Labs Reviewed  RAPID STREP SCREEN  CULTURE, GROUP A STREP    Imaging Review No results found.   EKG Interpretation None      MDM  Final diagnoses:  Pharyngitis    12:22 AM Rapid strep negative. Vitals stable and patient afebrile. Patient will be treated with antibiotics due to persistent symptoms. Patient instructed to return with worsening or concerning symptoms.   12:33 AM Patient is unhappy with her care and is convinced there is something "really wrong" with her throat. Patient insists on seeing  an ENT. Patient will also have viscous lidocaine for throat pain.   Alvina Chou, PA-C 10/10/14 0026  Alvina Chou, PA-C 10/10/14 6553  Everlene Balls, MD 10/10/14 1524

## 2014-10-10 NOTE — ED Notes (Signed)
PT WOULD LIKE TO BE CALLED WITH STREP CULTURE RESULTS, REGARDLESS OF WHETHER THEY ARE POSITIVE OR NEGATIVE.    PT ALSO REFERRED TO "MYCHART".

## 2014-10-10 NOTE — Discharge Instructions (Signed)
Take amoxicillin as directed until gone. Take zyrtec as directed. Refer to attached documents for more information. Return to the ED with worsening or concerning symptoms.

## 2014-10-13 LAB — CULTURE, GROUP A STREP: Strep A Culture: NEGATIVE

## 2015-02-24 ENCOUNTER — Encounter (HOSPITAL_COMMUNITY): Payer: Self-pay | Admitting: Emergency Medicine

## 2015-02-24 ENCOUNTER — Emergency Department (HOSPITAL_COMMUNITY): Payer: BLUE CROSS/BLUE SHIELD

## 2015-02-24 ENCOUNTER — Emergency Department (HOSPITAL_COMMUNITY)
Admission: EM | Admit: 2015-02-24 | Discharge: 2015-02-25 | Disposition: A | Discharge status: Home / Self Care | Payer: BLUE CROSS/BLUE SHIELD | Attending: Emergency Medicine | Admitting: Emergency Medicine

## 2015-02-24 DIAGNOSIS — Y939 Activity, unspecified: Secondary | ICD-10-CM | POA: Diagnosis not present

## 2015-02-24 DIAGNOSIS — Y92009 Unspecified place in unspecified non-institutional (private) residence as the place of occurrence of the external cause: Secondary | ICD-10-CM | POA: Insufficient documentation

## 2015-02-24 DIAGNOSIS — S40211A Abrasion of right shoulder, initial encounter: Secondary | ICD-10-CM | POA: Insufficient documentation

## 2015-02-24 DIAGNOSIS — Z862 Personal history of diseases of the blood and blood-forming organs and certain disorders involving the immune mechanism: Secondary | ICD-10-CM | POA: Insufficient documentation

## 2015-02-24 DIAGNOSIS — Y999 Unspecified external cause status: Secondary | ICD-10-CM | POA: Diagnosis not present

## 2015-02-24 DIAGNOSIS — M542 Cervicalgia: Secondary | ICD-10-CM

## 2015-02-24 DIAGNOSIS — S299XXA Unspecified injury of thorax, initial encounter: Secondary | ICD-10-CM | POA: Diagnosis not present

## 2015-02-24 DIAGNOSIS — S51012A Laceration without foreign body of left elbow, initial encounter: Secondary | ICD-10-CM | POA: Insufficient documentation

## 2015-02-24 DIAGNOSIS — S4992XA Unspecified injury of left shoulder and upper arm, initial encounter: Secondary | ICD-10-CM | POA: Insufficient documentation

## 2015-02-24 DIAGNOSIS — S199XXA Unspecified injury of neck, initial encounter: Secondary | ICD-10-CM | POA: Insufficient documentation

## 2015-02-24 MED ORDER — IBUPROFEN 400 MG PO TABS
800.0000 mg | ORAL_TABLET | Freq: Once | ORAL | Status: AC
  Administered 2015-02-24: 800 mg via ORAL
  Filled 2015-02-24: qty 2

## 2015-02-24 MED ORDER — OXYCODONE-ACETAMINOPHEN 5-325 MG PO TABS
2.0000 | ORAL_TABLET | Freq: Once | ORAL | Status: AC
Start: 2015-02-24 — End: 2015-02-24
  Administered 2015-02-24: 1 via ORAL
  Filled 2015-02-24: qty 2
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
## 2015-02-24 NOTE — ED Notes (Signed)
C- collar applied at triage . 

## 2015-02-24 NOTE — ED Notes (Signed)
Pt refused chest x-ray because she says her chest does not hurt anymore. [place], [place] informed.
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
## 2015-02-24 NOTE — ED Notes (Signed)
Pt moved to room 11 since that room has a bed; she stated she wanted to lay down. Pt ambulated with steady gait to new room

## 2015-02-25 MED ORDER — IBUPROFEN 800 MG PO TABS: 800.0000 mg | ORAL_TABLET | Freq: Once | ORAL | Status: DC

## 2015-02-25 MED ORDER — HYDROCODONE-ACETAMINOPHEN 5-325 MG PO TABS
1.0000 | ORAL_TABLET | Freq: Once | ORAL | Status: AC
  Administered 2015-02-25: 1 via ORAL
  Filled 2015-02-25: qty 1

## 2015-02-25 NOTE — Discharge Instructions (Signed)
You were evaluated in the ED today for your neck discomfort, shoulder discomfort, elbow discomfort and chest discomfort. There does not appear to be an emergent cause for your symptoms at this time. Please take your medications for any discomfort  at home. Follow-up with your primary care for further evaluation and management of her symptoms. Return to ED for new or worsening symptoms.  Assault, General Assault includes any behavior, whether intentional or reckless, which results in bodily injury to another person and/or damage to property. Included in this would be any behavior, intentional or reckless, that by its nature would be understood (interpreted) by a reasonable person as intent to harm another person or to damage his/her property. Threats may be oral or written. They may be communicated through regular mail, computer, fax, or phone. These threats may be direct or implied. FORMS OF ASSAULT INCLUDE:  Physically assaulting a person. This includes physical threats to inflict physical harm as well as:  Slapping.  Hitting.  Poking.  Kicking.  Punching.  Pushing.  Arson.  Sabotage.  Equipment vandalism.  Damaging or destroying property.  Throwing or hitting objects.  Displaying a weapon or an object that appears to be a weapon in a threatening manner.  Carrying a firearm of any kind.  Using a weapon to harm someone.  Using greater physical size/strength to intimidate another.  Making intimidating or threatening gestures.  Bullying.  Hazing.  Intimidating, threatening, hostile, or abusive language directed toward another person.  It communicates the intention to engage in violence against that person. And it leads a reasonable person to expect that violent behavior may occur.  Stalking another person. IF IT HAPPENS AGAIN:  Immediately call for emergency help (911 in [place]  If someone poses clear and immediate danger to you, seek legal authorities to have a  protective or restraining order put in place.  Less threatening assaults can at least be reported to authorities. STEPS TO TAKE IF A SEXUAL ASSAULT HAS HAPPENED  Go to an area of safety. This may include a shelter or staying with a friend. Stay away from the area where you have been attacked. A large percentage of sexual assaults are caused by a friend, relative or associate.  If medications were given by your caregiver, take them as directed for the full length of time prescribed.  Only take over-the-counter or prescription medicines for pain, discomfort, or fever as directed by your caregiver.  If you have come in contact with a sexual disease, find out if you are to be tested again. If your caregiver is concerned about the HIV/AIDS virus, he/she may require you to have continued testing for several months.  For the protection of your privacy, test results can not be given over the phone. Make sure you receive the results of your test. If your test results are not back during your visit, make an appointment with your caregiver to find out the results. Do not assume everything is normal if you have not heard from your caregiver or the medical facility. It is important for you to follow up on all of your test results.  File appropriate papers with authorities. This is important in all assaults, even if it has occurred in a family or by a friend. SEEK MEDICAL CARE IF:  You have new problems because of your injuries.  You have problems that may be because of the medicine you are taking, such as:  Rash.  Itching.  Swelling.  Trouble breathing.  You develop belly (  abdominal) pain, feel sick to your stomach (nausea) or are vomiting.  You begin to run a temperature.  You need supportive care or referral to a rape crisis center. These are centers with trained personnel who can help you get through this ordeal. SEEK IMMEDIATE MEDICAL CARE IF:  You are afraid of being threatened, beaten,  or abused. In [place] call 911.  You receive new injuries related to abuse.  You develop severe pain in any area injured in the assault or have any change in your condition that concerns you.  You faint or lose consciousness.  You develop chest pain or shortness of breath. Document Released: 07/25/2005 Document Revised: 10/17/2011 Document Reviewed: 03/12/2008 [facility] Patient Information 2015 [place], [place]. This information is not intended to replace advice given to you by your health care provider. Make sure you discuss any questions you have with your health care provider.  Cervical Sprain A cervical sprain is an injury in the neck in which the strong, fibrous tissues (ligaments) that connect your neck bones stretch or tear. Cervical sprains can range from mild to severe. Severe cervical sprains can cause the neck vertebrae to be unstable. This can lead to damage of the spinal cord and can result in serious nervous system problems. The amount of time it takes for a cervical sprain to get better depends on the cause and extent of the injury. Most cervical sprains heal in 1 to 3 weeks. CAUSES  Severe cervical sprains may be caused by:   Contact sport injuries (such as from football, rugby, wrestling, hockey, auto racing, gymnastics, diving, martial arts, or boxing).   Motor vehicle collisions.   Whiplash injuries. This is an injury from a sudden forward and backward whipping movement of the head and neck.  Falls.  Mild cervical sprains may be caused by:   Being in an awkward position, such as while cradling a telephone between your ear and shoulder.   Sitting in a chair that does not offer proper support.   Working at a poorly Landscape [employer] station.   Looking up or down for long periods of time.  SYMPTOMS   Pain, soreness, stiffness, or a burning sensation in the front, back, or sides of the neck. This discomfort may develop immediately after the injury or slowly, 24  hours or more after the injury.   Pain or tenderness directly in the middle of the back of the neck.   Shoulder or upper back pain.   Limited ability to move the neck.   Headache.   Dizziness.   Weakness, numbness, or tingling in the hands or arms.   Muscle spasms.   Difficulty swallowing or chewing.   Tenderness and swelling of the neck.  DIAGNOSIS  Most of the time your health care provider can diagnose a cervical sprain by taking your history and doing a physical exam. Your health care provider will ask about previous neck injuries and any known neck problems, such as arthritis in the neck. X-rays may be taken to find out if there are any other problems, such as with the bones of the neck. Other tests, such as a CT scan or MRI, may also be needed.  TREATMENT  Treatment depends on the severity of the cervical sprain. Mild sprains can be treated with rest, keeping the neck in place (immobilization), and pain medicines. Severe cervical sprains are immediately immobilized. Further treatment is done to help with pain, muscle spasms, and other symptoms and may include:  Medicines, such as pain  relievers, numbing medicines, or muscle relaxants.   Physical therapy. This may involve stretching exercises, strengthening exercises, and posture training. Exercises and improved posture can help stabilize the neck, strengthen muscles, and help stop symptoms from returning.  HOME CARE INSTRUCTIONS   Put ice on the injured area.   Put ice in a plastic bag.   Place a towel between your skin and the bag.   Leave the ice on for 15-20 minutes, 3-4 times a day.   If your injury was severe, you may have been given a cervical collar to wear. A cervical collar is a two-piece collar designed to keep your neck from moving while it heals.  Do not remove the collar unless instructed by your health care provider.  If you have long hair, keep it outside of the collar.  Ask your health  care provider before making any adjustments to your collar. Minor adjustments may be required over time to improve comfort and reduce pressure on your chin or on the back of your head.  Ifyou are allowed to remove the collar for cleaning or bathing, follow your health care provider's instructions on how to do so safely.  Keep your collar clean by wiping it with mild soap and water and drying it completely. If the collar you have been given includes removable pads, remove them every 1-2 days and hand wash them with soap and water. Allow them to air dry. They should be completely dry before you wear them in the collar.  If you are allowed to remove the collar for cleaning and bathing, wash and dry the skin of your neck. Check your skin for irritation or sores. If you see any, tell your health care provider.  Do not drive while wearing the collar.   Only take over-the-counter or prescription medicines for pain, discomfort, or fever as directed by your health care provider.   Keep all follow-up appointments as directed by your health care provider.   Keep all physical therapy appointments as directed by your health care provider.   Make any needed adjustments to your workstation to promote good posture.   Avoid positions and activities that make your symptoms worse.   Warm up and stretch before being active to help prevent problems.  SEEK MEDICAL CARE IF:   Your pain is not controlled with medicine.   You are unable to decrease your pain medicine over time as planned.   Your activity level is not improving as expected.  SEEK IMMEDIATE MEDICAL CARE IF:   You develop any bleeding.  You develop stomach upset.  You have signs of an allergic reaction to your medicine.   Your symptoms get worse.   You develop new, unexplained symptoms.   You have numbness, tingling, weakness, or paralysis in any part of your body.  MAKE SURE YOU:   Understand these  instructions.  Will watch your condition.  Will get help right away if you are not doing well or get worse. Document Released: 05/22/2007 Document Revised: 07/30/2013 Document Reviewed: 01/30/2013 [facility] Patient Information 2015 [place], [place]. This information is not intended to replace advice given to you by your health care provider. Make sure you discuss any questions you have with your health care provider.

## 2015-02-25 NOTE — ED Notes (Signed)
Bacitracin and band aids applied to abrasions on her right upper back. Pt A&OX4, ambulatory at d/c with steady gait, NAD

## 2015-02-25 NOTE — ED Notes (Signed)
Pt requesting pain medication before she is discharged. [place], [place] informed and states to give one norco tablet PO.

## 2015-05-24 ENCOUNTER — Inpatient Hospital Stay (HOSPITAL_COMMUNITY)
Admission: AD | Admit: 2015-05-24 | Discharge: 2015-05-24 | Disposition: A | Discharge status: Home / Self Care | Payer: Medicaid Other | Source: Ambulatory Visit | Attending: Obstetrics and Gynecology | Admitting: Obstetrics and Gynecology

## 2015-05-24 DIAGNOSIS — N390 Urinary tract infection, site not specified: Secondary | ICD-10-CM | POA: Diagnosis not present

## 2015-05-24 DIAGNOSIS — R102 Pelvic and perineal pain: Secondary | ICD-10-CM | POA: Diagnosis present

## 2015-05-24 DIAGNOSIS — R399 Unspecified symptoms and signs involving the genitourinary system: Secondary | ICD-10-CM | POA: Insufficient documentation

## 2015-05-24 DIAGNOSIS — N10 Acute tubulo-interstitial nephritis: Secondary | ICD-10-CM

## 2015-05-24 LAB — URINALYSIS, ROUTINE W REFLEX MICROSCOPIC
Bilirubin Urine: NEGATIVE
Glucose, UA: NEGATIVE mg/dL
Ketones, ur: NEGATIVE mg/dL
Nitrite: NEGATIVE
PROTEIN: NEGATIVE mg/dL
Specific Gravity, Urine: 1.025 (ref 1.005–1.030)
UROBILINOGEN UA: 0.2 mg/dL (ref 0.0–1.0)
pH: 6 (ref 5.0–8.0)

## 2015-05-24 LAB — WET PREP, GENITAL
Clue Cells Wet Prep HPF POC: NONE SEEN
Trich, Wet Prep: NONE SEEN
Yeast Wet Prep HPF POC: NONE SEEN

## 2015-05-24 LAB — URINE MICROSCOPIC-ADD ON

## 2015-05-24 LAB — POCT PREGNANCY, URINE: PREG TEST UR: NEGATIVE

## 2015-05-24 MED ORDER — PHENAZOPYRIDINE HCL 100 MG PO TABS
200.0000 mg | ORAL_TABLET | Freq: Once | ORAL | Status: AC
  Administered 2015-05-24: 200 mg via ORAL
  Filled 2015-05-24: qty 2

## 2015-05-24 MED ORDER — PHENAZOPYRIDINE HCL 100 MG PO TABS: 100.0000 mg | ORAL_TABLET | Freq: Three times a day (TID) | ORAL | Status: DC | PRN

## 2015-05-24 MED ORDER — LEVOFLOXACIN 250 MG PO TABS: 250.0000 mg | ORAL_TABLET | Freq: Every day | ORAL | Status: DC

## 2015-05-24 NOTE — MAU Provider Note (Signed)
History  34 yo female presents to MAU unannounced w/ c/o sudden onset of increased urge to urinate, pelvic pain and pain with urination. Has had a "strange sensation" when urinating x 2 days. Denies back pain, N/V or fever. Also reports swollen right groin lymph node. Demanded Pyridium now, then script for both Pyridium and antibiotic for "UTI." LMP 05/19/15.  Patient Active Problem List   Diagnosis Date Noted  . UTI symptoms 05/24/2015  . Pain in the chest 07/22/2014  . Dyspnea 07/22/2014  . Family history of coronary artery disease 07/22/2014  . MURMUR 08/31/2010  . CHEST PAIN-UNSPECIFIED 08/31/2010  . ANEMIA, IRON DEFICIENCY 08/30/2010  . PALPITATIONS 08/30/2010  . SHORTNESS OF BREATH 08/30/2010  . CHEST WALL PAIN, HX OF 08/30/2010    No chief complaint on file.  HPI As above OB History    Gravida Para Term Preterm AB TAB SAB Ectopic Multiple Living   7 6 6  0 1 0 1 0 0 6      Past Medical History  Diagnosis Date  . Anemia     Past Surgical History  Procedure Laterality Date  . No past surgeries      No family history on file.  Social History  Substance Use Topics  . Smoking status: Never Smoker   . Smokeless tobacco: Not on file  . Alcohol Use: No    Allergies:  Allergies  Allergen Reactions  . Diflucan [Fluconazole]     Mouth and vagina area breaks out into hives  . Sulfamethoxazole-Trimethoprim Hives  . Latex Rash    No prescriptions prior to admission    ROS  UTI symptoms Physical Exam   Blood pressure 129/77, pulse 87, resp. rate 16, last menstrual period 05/19/2015. Recent Results (from the past 2160 hour(s))  GC/Chlamydia probe amp ()not at [facility]     Status: None   Collection Time: 05/24/15 12:00 AM  Result Value Ref Range   Chlamydia Negative     Comment: Normal Reference Range - Negative   Neisseria gonorrhea Negative     Comment: Normal Reference Range - Negative  Urinalysis, Routine w reflex microscopic (not at [facility])      Status: Abnormal   Collection Time: 05/24/15  8:40 PM  Result Value Ref Range   Color, Urine YELLOW YELLOW   APPearance CLEAR CLEAR   Specific Gravity, Urine 1.025 1.005 - 1.030   pH 6.0 5.0 - 8.0   Glucose, UA NEGATIVE NEGATIVE mg/dL   Hgb urine dipstick LARGE (A) NEGATIVE   Bilirubin Urine NEGATIVE NEGATIVE   Ketones, ur NEGATIVE NEGATIVE mg/dL   Protein, ur NEGATIVE NEGATIVE mg/dL   Urobilinogen, UA 0.2 0.0 - 1.0 mg/dL   Nitrite NEGATIVE NEGATIVE   Leukocytes, UA TRACE (A) NEGATIVE  Urine microscopic-add on     Status: Abnormal   Collection Time: 05/24/15  8:40 PM  Result Value Ref Range   Squamous Epithelial / LPF FEW (A) RARE   WBC, UA 3-6 <3 WBC/hpf   RBC / HPF 11-20 <3 RBC/hpf   Bacteria, UA FEW (A) RARE  Urine culture     Status: None   Collection Time: 05/24/15  8:40 PM  Result Value Ref Range   Specimen Description URINE, RANDOM    Special Requests NONE    Culture      >=100,000 COLONIES/mL ESCHERICHIA COLI Performed at [facility]    Report Status 05/27/2015 FINAL    Organism ID, Bacteria ESCHERICHIA COLI       Susceptibility  Escherichia coli - MIC*    AMPICILLIN <=2 SENSITIVE Sensitive     CEFAZOLIN <=4 SENSITIVE Sensitive     CEFTRIAXONE <=1 SENSITIVE Sensitive     CIPROFLOXACIN <=0.25 SENSITIVE Sensitive     GENTAMICIN <=1 SENSITIVE Sensitive     IMIPENEM <=0.25 SENSITIVE Sensitive     NITROFURANTOIN <=16 SENSITIVE Sensitive     TRIMETH/SULFA <=20 SENSITIVE Sensitive     AMPICILLIN/SULBACTAM <=2 SENSITIVE Sensitive     PIP/TAZO <=4 SENSITIVE Sensitive     * >=100,000 COLONIES/mL ESCHERICHIA COLI  Pregnancy, urine POC     Status: None   Collection Time: 05/24/15  8:43 PM  Result Value Ref Range   Preg Test, Ur NEGATIVE NEGATIVE    Comment:        THE SENSITIVITY OF THIS METHODOLOGY IS >24 mIU/mL   Wet prep, genital     Status: Abnormal   Collection Time: 05/24/15 10:10 PM  Result Value Ref Range   Yeast Wet Prep HPF POC NONE SEEN NONE  SEEN   Trich, Wet Prep NONE SEEN NONE SEEN   Clue Cells Wet Prep HPF POC NONE SEEN NONE SEEN   WBC, Wet Prep HPF POC FEW (A) NONE SEEN    Comment: MODERATE BACTERIA SEEN     Physical Exam Gen: Acute distress Abdomen: soft, tender to palpation in lower abdomen, no guarding or rebound tenderness Back: Neg CVAT bilaterally Right groin indeed edematous, yet no pain elicited on palpation Pelvic: Due to c/o swollen right groin, wet prep and GC/CT collected. Scant blood tinged discharge noted, no odor   ED Course  Pyridium tab  Assessment: UTI symptoms Neg urine hCG  Plan: Reviewed UA results w/ pt. Informed that it was + for large blood --- she at that time informed me she was still on her period. UA sent for culture.  Pyridium 200 mg tab x 1 now. Also explained that I would treat her based on symptoms and per protocol, she needed to stay for 20 min after ingestion of Pyridium, and that I would return to discharge her and electronically Rx medication for UTI symptoms.   Farrel Gordon CNM, MS 05/24/15, 10:26 PM   ADDENDUM: Back to MAU to discharge pt, after attending to a labor pt. RN informed me pt left and said to "just put medication in to the [facility] on [address] Since pt left, I did not get the opportunity to discuss the medication I planned to prescribe, which was to treat as an uncomplicated UTI until the urine culture was resulted. Pyridium 100 mg tab - 1 tab po tid prn pain (x 2 days), #6, no refills, and Levofloxacin 250 mg tab - 1 tab po q 24 hrs x 3 days, #3, no refills electronically sent to [facility]. Will have office f/u w/ pt after urine culture resulted to ascertain if med helpful, or needs to be changed or increased.  Farrel Gordon, CNM, MS 05/24/15, 11:00 PM

## 2015-05-24 NOTE — MAU Note (Signed)
Pt noticed a strange sensation for the past couple days when she voided. Today it hurts when she voids.  She states her right groin lymph nodes are swollen.

## 2015-05-25 LAB — GC/CHLAMYDIA PROBE AMP (~~LOC~~) NOT AT ARMC
CHLAMYDIA, DNA PROBE: NEGATIVE
Neisseria Gonorrhea: NEGATIVE

## 2015-05-27 LAB — URINE CULTURE

## 2015-08-09 NOTE — L&D Delivery Note (Signed)
Delivery Note At 9:17 AM a viable female was delivered via Vaginal, Spontaneous Delivery (Presentation: direct OP ).  APGAR: 5, 9; weight 4+8.8 .   Placenta status: spont , intact- to path due to pre-e .  Cord: nuchal x 3, loose, delivered through; subjectively long; 3 vessel.  Anesthesia:  epidural Episiotomy:  None Lacerations: None Est. Blood Loss (mL):  100  Mom to postpartum.  Baby to Couplet care / Skin to Skin, unless determined to go to NICU for size.  Cytotec 848mcg given prophylactically after delivery.  AMORI, BARTEL CNM 03/30/2016, 9:43 AM

## 2015-09-10 ENCOUNTER — Inpatient Hospital Stay (HOSPITAL_COMMUNITY)
Admission: AD | Admit: 2015-09-10 | Discharge: 2015-09-11 | Disposition: A | Discharge status: Home / Self Care | Payer: Medicaid Other | Source: Ambulatory Visit | Attending: Obstetrics & Gynecology | Admitting: Obstetrics & Gynecology

## 2015-09-10 ENCOUNTER — Encounter (HOSPITAL_COMMUNITY): Payer: Self-pay | Admitting: *Deleted

## 2015-09-10 DIAGNOSIS — A499 Bacterial infection, unspecified: Secondary | ICD-10-CM | POA: Diagnosis not present

## 2015-09-10 DIAGNOSIS — N898 Other specified noninflammatory disorders of vagina: Secondary | ICD-10-CM | POA: Diagnosis present

## 2015-09-10 DIAGNOSIS — Z0189 Encounter for other specified special examinations: Secondary | ICD-10-CM | POA: Diagnosis present

## 2015-09-10 DIAGNOSIS — R109 Unspecified abdominal pain: Secondary | ICD-10-CM | POA: Diagnosis present

## 2015-09-10 DIAGNOSIS — K219 Gastro-esophageal reflux disease without esophagitis: Secondary | ICD-10-CM | POA: Insufficient documentation

## 2015-09-10 DIAGNOSIS — O23591 Infection of other part of genital tract in pregnancy, first trimester: Secondary | ICD-10-CM | POA: Diagnosis not present

## 2015-09-10 DIAGNOSIS — N760 Acute vaginitis: Secondary | ICD-10-CM

## 2015-09-10 DIAGNOSIS — D649 Anemia, unspecified: Secondary | ICD-10-CM | POA: Insufficient documentation

## 2015-09-10 DIAGNOSIS — B9689 Other specified bacterial agents as the cause of diseases classified elsewhere: Secondary | ICD-10-CM

## 2015-09-10 DIAGNOSIS — Z3689 Encounter for other specified antenatal screening: Secondary | ICD-10-CM

## 2015-09-10 NOTE — MAU Note (Addendum)
PT  SAYS   SHE HAS GAS  AND BLOATING-    WORRIED ABOUT  HER  HEART.    STARTED   2 WEEKS  AGO,       FEELS    STRONGER  HUNGER PAIN   AFTER SHE  EATS   -  NO N/V/-     STOOL LOOSE   SHE CALLED  DR  ON WED-   TOLD  TO GO TO URGENT  CARE.   - DID NOT  GO - CAME HERE.       SHE DID HPT- LAST NIGHT  -  POSITIVE             FEELS  TIRED      HAS VAG  ODOR-      TODAY HAD PINK SPOTTING      SMELLS LIKE  STOOL .    VOIDS A LOT-   DURING  NIGHT.    - NO BURNING.          LAST SEEN AT  [facility]  IN 01-2015-     WAS ON BC- BUT HAD BLOOD  CLOTS--   SO STOPPED  BC-   THINKS  SHE  HAD  BLOOD  CLOT IN HER  LEG.   Marland Kitchen

## 2015-09-11 DIAGNOSIS — B9689 Other specified bacterial agents as the cause of diseases classified elsewhere: Secondary | ICD-10-CM | POA: Diagnosis not present

## 2015-09-11 DIAGNOSIS — O23591 Infection of other part of genital tract in pregnancy, first trimester: Secondary | ICD-10-CM | POA: Diagnosis not present

## 2015-09-11 DIAGNOSIS — K219 Gastro-esophageal reflux disease without esophagitis: Secondary | ICD-10-CM | POA: Diagnosis not present

## 2015-09-11 LAB — GC/CHLAMYDIA PROBE AMP (~~LOC~~) NOT AT ARMC
CHLAMYDIA, DNA PROBE: NEGATIVE
NEISSERIA GONORRHEA: NEGATIVE

## 2015-09-11 LAB — WET PREP, GENITAL
SPERM: NONE SEEN
Trich, Wet Prep: NONE SEEN
Yeast Wet Prep HPF POC: NONE SEEN

## 2015-09-11 LAB — URINALYSIS, ROUTINE W REFLEX MICROSCOPIC
BILIRUBIN URINE: NEGATIVE
Glucose, UA: NEGATIVE mg/dL
HGB URINE DIPSTICK: NEGATIVE
Ketones, ur: NEGATIVE mg/dL
Leukocytes, UA: NEGATIVE
Nitrite: NEGATIVE
Protein, ur: NEGATIVE mg/dL
SPECIFIC GRAVITY, URINE: 1.025 (ref 1.005–1.030)
pH: 5.5 (ref 5.0–8.0)

## 2015-09-11 LAB — POCT PREGNANCY, URINE: Preg Test, Ur: POSITIVE — AB

## 2015-09-11 MED ORDER — METRONIDAZOLE 500 MG PO TABS: 500.0000 mg | ORAL_TABLET | Freq: Two times a day (BID) | ORAL | Status: DC

## 2015-09-11 MED ORDER — PROMETHAZINE HCL 25 MG PO TABS: 12.5000 mg | ORAL_TABLET | Freq: Four times a day (QID) | ORAL | Status: DC | PRN

## 2015-09-11 MED ORDER — FAMOTIDINE 40 MG PO TABS: 40.0000 mg | ORAL_TABLET | Freq: Every day | ORAL | Status: DC

## 2015-09-11 MED ORDER — GI COCKTAIL ~~LOC~~
30.0000 mL | Freq: Once | ORAL | Status: AC
  Administered 2015-09-11: 30 mL via ORAL
  Filled 2015-09-11: qty 30

## 2015-09-11 NOTE — Discharge Instructions (Signed)
Prenatal Care Providers [facility]  [phone]     Phone: [place]                      [facility]  [email or web address]     Phone: [phone]  Phone: [place]     Phone: [phone]  Phone: [place] [facility] [place]                hone: [phone]  Phone: [phone]         [facility] [address]      Phone: [phone]  Phone: [phone]         [place] [facility]                Phone: [phone]  [place])          Phone: [phone] [facility]   Phone: [phone] Safe Medications in Pregnancy   Acne: Benzoyl Peroxide Salicylic Acid  Backache/Headache: Tylenol: 2 regular strength every 4 hours OR              2 Extra strength every 6 hours  Colds/Coughs/Allergies: Benadryl (alcohol free) 25 mg every 6 hours as needed Breath right strips Claritin Cepacol throat lozenges Chloraseptic throat spray Cold-Eeze- up to three times per day Cough drops, alcohol free Flonase (by prescription only) Guaifenesin Mucinex Robitussin DM (plain only, alcohol free) Saline nasal spray/drops Sudafed (pseudoephedrine) & Actifed ** use only after [redacted] weeks gestation and if you do not have high blood pressure Tylenol Vicks Vaporub Zinc lozenges Zyrtec   Constipation: Colace Ducolax suppositories Fleet enema Glycerin suppositories Metamucil Milk of magnesia Miralax Senokot Smooth move tea  Diarrhea: Kaopectate Imodium A-D  *NO pepto Bismol  Hemorrhoids: Anusol Anusol HC Preparation H Tucks  Indigestion: Tums Maalox Mylanta Zantac  Pepcid  Insomnia: Benadryl (alcohol free) 25mg  every 6 hours as needed Tylenol  PM Unisom, no Gelcaps  Leg Cramps: Tums MagGel  Nausea/Vomiting:  Bonine Dramamine Emetrol Ginger extract Sea bands Meclizine  Nausea medication to take during pregnancy:  Unisom (doxylamine succinate 25 mg tablets) Take one tablet daily at bedtime. If symptoms are not adequately controlled, the dose can be increased to a maximum recommended dose of two tablets daily (1/2 tablet in the morning, 1/2 tablet mid-afternoon and one at bedtime). Vitamin B6 100mg  tablets. Take one tablet twice a day (up to 200 mg per day).  Skin Rashes: Aveeno products Benadryl cream or 25mg  every 6 hours as needed Calamine Lotion 1% cortisone cream  Yeast infection: Gyne-lotrimin 7 Monistat 7   **If taking multiple medications, please check labels to avoid duplicating the same active ingredients **take medication as directed on the label ** Do not exceed 4000 mg of tylenol in 24 hours **Do not take medications that contain aspirin or ibuprofen    First Trimester of Pregnancy The first trimester of pregnancy is from week 1 until the end of week 12 (months 1 through 3). A week after a sperm fertilizes an egg, the egg will implant on the wall of the uterus. This embryo  embryo will begin to develop into a baby. Genes from you and your partner are forming the baby. The female genes determine whether the baby is a boy or a girl. At 6-8 weeks, the eyes and face are formed, and the heartbeat can be seen on ultrasound. At the end of 12 weeks, all the baby's organs are formed.  °Now that you are pregnant, you will want to do everything you can to have a healthy baby. Two of the most important things are to get good prenatal care and to follow your health care provider's instructions. Prenatal care is all the medical care you receive before the baby's birth. This care will help prevent, find, and treat any problems during the pregnancy and childbirth. °BODY CHANGES °Your body goes through many changes during pregnancy.  The changes vary from woman to woman.  °· You may gain or lose a couple of pounds at first. °· You may feel sick to your stomach (nauseous) and throw up (vomit). If the vomiting is uncontrollable, call your health care provider. °· You may tire easily. °· You may develop headaches that can be relieved by medicines approved by your health care provider. °· You may urinate more often. Painful urination may mean you have a bladder infection. °· You may develop heartburn as a result of your pregnancy. °· You may develop constipation because certain hormones are causing the muscles that push waste through your intestines to slow down. °· You may develop hemorrhoids or swollen, bulging veins (varicose veins). °· Your breasts may begin to grow larger and become tender. Your nipples may stick out more, and the tissue that surrounds them (areola) may become darker. °· Your gums may bleed and may be sensitive to brushing and flossing. °· Dark spots or blotches (chloasma, mask of pregnancy) may develop on your face. This will likely fade after the baby is born. °· Your menstrual periods will stop. °· You may have a loss of appetite. °· You may develop cravings for certain kinds of food. °· You may have changes in your emotions from day to day, such as being excited to be pregnant or being concerned that something may go wrong with the pregnancy and baby. °· You may have more vivid and strange dreams. °· You may have changes in your hair. These can include thickening of your hair, rapid growth, and changes in texture. Some women also have hair loss during or after pregnancy, or hair that feels dry or thin. Your hair will most likely return to normal after your baby is born. °WHAT TO EXPECT AT YOUR PRENATAL VISITS °During a routine prenatal visit: °· You will be weighed to make sure you and the baby are growing normally. °· Your blood pressure will be taken. °· Your abdomen will be measured to track your baby's growth. °· The  fetal heartbeat will be listened to starting around week 10 or 12 of your pregnancy. °· Test results from any previous visits will be discussed. °Your health care provider may ask you: °· How you are feeling. °· If you are feeling the baby move. °· If you have had any abnormal symptoms, such as leaking fluid, bleeding, severe headaches, or abdominal cramping. °· If you are using any tobacco products, including cigarettes, chewing tobacco, and electronic cigarettes. °· If you have any questions. °Other tests that may be performed during your first trimester include: °· Blood tests to find your blood type and to check for the presence of any previous   infections. They will also be used to check for low iron levels (anemia) and Rh antibodies. Later in the pregnancy, blood tests for diabetes will be done along with other tests if problems develop. °· Urine tests to check for infections, diabetes, or protein in the urine. °· An ultrasound to confirm the proper growth and development of the baby. °· An amniocentesis to check for possible genetic problems. °· Fetal screens for spina bifida and Down syndrome. °· You may need other tests to make sure you and the baby are doing well. °· HIV (human immunodeficiency virus) testing. Routine prenatal testing includes screening for HIV, unless you choose not to have this test. °HOME CARE INSTRUCTIONS  °Medicines °· Follow your health care provider's instructions regarding medicine use. Specific medicines may be either safe or unsafe to take during pregnancy. °· Take your prenatal vitamins as directed. °· If you develop constipation, try taking a stool softener if your health care provider approves. °Diet °· Eat regular, well-balanced meals. Choose a variety of foods, such as meat or vegetable-based protein, fish, milk and low-fat dairy products, vegetables, fruits, and whole grain breads and cereals. Your health care provider will help you determine the amount of weight gain that  is right for you. °· Avoid raw meat and uncooked cheese. These carry germs that can cause birth defects in the baby. °· Eating four or five small meals rather than three large meals a day may help relieve nausea and vomiting. If you start to feel nauseous, eating a few soda crackers can be helpful. Drinking liquids between meals instead of during meals also seems to help nausea and vomiting. °· If you develop constipation, eat more high-fiber foods, such as fresh vegetables or fruit and whole grains. Drink enough fluids to keep your urine clear or pale yellow. °Activity and Exercise °· Exercise only as directed by your health care provider. Exercising will help you: °¨ Control your weight. °¨ Stay in shape. °¨ Be prepared for labor and delivery. °· Experiencing pain or cramping in the lower abdomen or low back is a good sign that you should stop exercising. Check with your health care provider before continuing normal exercises. °· Try to avoid standing for long periods of time. Move your legs often if you must stand in one place for a long time. °· Avoid heavy lifting. °· Wear low-heeled shoes, and practice good posture. °· You may continue to have sex unless your health care provider directs you otherwise. °Relief of Pain or Discomfort °· Wear a good support bra for breast tenderness.   °· Take warm sitz baths to soothe any pain or discomfort caused by hemorrhoids. Use hemorrhoid cream if your health care provider approves.   °· Rest with your legs elevated if you have leg cramps or low back pain. °· If you develop varicose veins in your legs, wear support hose. Elevate your feet for 15 minutes, 3-4 times a day. Limit salt in your diet. °Prenatal Care °· Schedule your prenatal visits by the twelfth week of pregnancy. They are usually scheduled monthly at first, then more often in the last 2 months before delivery. °· Write down your questions. Take them to your prenatal visits. °· Keep all your prenatal visits as  directed by your health care provider. °Safety °· Wear your seat belt at all times when driving. °· Make a list of emergency phone numbers, including numbers for family, friends, the hospital, and police and fire departments. °General Tips °· Ask your health care   provider for a referral to a local prenatal education class. Begin classes no later than at the beginning of month 6 of your pregnancy. °· Ask for help if you have counseling or nutritional needs during pregnancy. Your health care provider can offer advice or refer you to specialists for help with various needs. °· Do not use hot tubs, steam rooms, or saunas. °· Do not douche or use tampons or scented sanitary pads. °· Do not cross your legs for long periods of time. °· Avoid cat litter boxes and soil used by cats. These carry germs that can cause birth defects in the baby and possibly loss of the fetus by miscarriage or stillbirth. °· Avoid all smoking, herbs, alcohol, and medicines not prescribed by your health care provider. Chemicals in these affect the formation and growth of the baby. °· Do not use any tobacco products, including cigarettes, chewing tobacco, and electronic cigarettes. If you need help quitting, ask your health care provider. You may receive counseling support and other resources to help you quit. °· Schedule a dentist appointment. At home, brush your teeth with a soft toothbrush and be gentle when you floss. °SEEK MEDICAL CARE IF:  °· You have dizziness. °· You have mild pelvic cramps, pelvic pressure, or nagging pain in the abdominal area. °· You have persistent nausea, vomiting, or diarrhea. °· You have a bad smelling vaginal discharge. °· You have pain with urination. °· You notice increased swelling in your face, hands, legs, or ankles. °SEEK IMMEDIATE MEDICAL CARE IF:  °· You have a fever. °· You are leaking fluid from your vagina. °· You have spotting or bleeding from your vagina. °· You have severe abdominal cramping or  pain. °· You have rapid weight gain or loss. °· You vomit blood or material that looks like coffee grounds. °· You are exposed to German measles and have never had them. °· You are exposed to fifth disease or chickenpox. °· You develop a severe headache. °· You have shortness of breath. °· You have any kind of trauma, such as from a fall or a car accident. °  °This information is not intended to replace advice given to you by your health care provider. Make sure you discuss any questions you have with your health care provider. °  °Document Released: 07/19/2001 Document Revised: 08/15/2014 Document Reviewed: 06/04/2013 °Elsevier Interactive Patient Education ©2016 [employer] ° °

## 2015-09-11 NOTE — MAU Provider Note (Signed)
History     CSN: [id number]  Arrival date and time: 09/10/15 2309   First Provider Initiated Contact with Patient 09/11/15 0120      Chief Complaint  Patient presents with  . Abdominal Pain  . Vaginal Discharge   Abdominal Pain This is a new problem. The current episode started 1 to 4 weeks ago. The onset quality is gradual. The problem occurs intermittently. The problem has been unchanged. The pain is located in the epigastric region. The pain is at a severity of 5/10. The quality of the pain is colicky. The abdominal pain does not radiate. Associated symptoms include frequency and nausea. Pertinent negatives include no constipation, diarrhea, dysuria, fever or vomiting. The pain is aggravated by eating. The pain is relieved by nothing. She has tried antacids for the symptoms. The treatment provided no relief.  Vaginal Discharge The patient's primary symptoms include a genital odor and vaginal discharge. This is a new problem. The current episode started in the past 7 days. The problem occurs constantly. The problem has been unchanged. The patient is experiencing no pain. She is pregnant. Associated symptoms include abdominal pain, frequency and nausea. Pertinent negatives include no chills, constipation, diarrhea, dysuria, fever, urgency or vomiting. The vaginal discharge was malodorous. There has been no bleeding. She has not been passing clots. She has not been passing tissue. Nothing aggravates the symptoms. She has tried nothing for the symptoms. She is sexually active. No, her partner does not have an STD. Contraceptive use: stopped Nuva ring in December  Her menstrual history has been regular (LMP: 08/07/15).    Past Medical History  Diagnosis Date  . Anemia     Past Surgical History  Procedure Laterality Date  . No past surgeries      History reviewed. No pertinent family history.  Social History  Substance Use Topics  . Smoking status: Never Smoker   . Smokeless tobacco:  None  . Alcohol Use: No    Allergies:  Allergies  Allergen Reactions  . Diflucan [Fluconazole]     Mouth and vagina area breaks out into hives  . Sulfamethoxazole-Trimethoprim Hives  . Latex Rash    Prescriptions prior to admission  Medication Sig Dispense Refill Last Dose  . acetaminophen-codeine (TYLENOL #3) 300-30 MG tablet Take by mouth every 4 (four) hours as needed for moderate pain.   05/24/2015 at 1900  . ibuprofen (ADVIL,MOTRIN) 200 MG tablet Take 200 mg by mouth every 6 (six) hours as needed for moderate pain.   two weeks  . ibuprofen (ADVIL,MOTRIN) 800 MG tablet Take 1 tablet (800 mg total) by mouth once. (Patient not taking: Reported on 05/24/2015) 30 tablet 0 Not Taking at Unknown time  . levofloxacin (LEVAQUIN) 250 MG tablet Take 1 tablet (250 mg total) by mouth daily. 3 tablet 0   . Pediatric Multivit-Minerals-C (KIDS GUMMY BEAR VITAMINS) CHEW Chew 1 tablet by mouth daily.   Past Week at Unknown time  . phenazopyridine (PYRIDIUM) 100 MG tablet Take 1 tablet (100 mg total) by mouth 3 (three) times daily as needed for pain. 6 tablet 0     Review of Systems  Constitutional: Negative for fever and chills.  Gastrointestinal: Positive for nausea and abdominal pain. Negative for vomiting, diarrhea and constipation.  Genitourinary: Positive for frequency and vaginal discharge. Negative for dysuria and urgency.   Physical Exam   Blood pressure 127/71, pulse 86, temperature 98.4 F (36.9 C), temperature source Oral, resp. rate 18, height 5\' 5"  (1.651 m), weight 86.864  kg (191 lb 8 oz), last menstrual period 08/08/2015, SpO2 100 %.  Physical Exam  Nursing note and vitals reviewed. Constitutional: She is oriented to person, place, and time. She appears well-developed and well-nourished. No distress.  HENT:  Head: Normocephalic.  Eyes: Pupils are equal, round, and reactive to light.  Cardiovascular: Normal rate.   Respiratory: Effort normal.  GI: Soft. There is no  tenderness. There is no rebound.  Genitourinary:   External: no lesion Vagina: small amount of tan discharge Cervix: pink, smooth, no CMT Uterus: NSSC Adnexa: NT   Neurological: She is alert and oriented to person, place, and time.  Skin: Skin is warm and dry.  Psychiatric: She has a normal mood and affect.    Results for orders placed or performed during the hospital encounter of 09/10/15 (from the past 24 hour(s))  Urinalysis, Routine w reflex microscopic (not at [facility])     Status: None   Collection Time: 09/10/15 11:36 PM  Result Value Ref Range   Color, Urine YELLOW YELLOW   APPearance CLEAR CLEAR   Specific Gravity, Urine 1.025 1.005 - 1.030   pH 5.5 5.0 - 8.0   Glucose, UA NEGATIVE NEGATIVE mg/dL   Hgb urine dipstick NEGATIVE NEGATIVE   Bilirubin Urine NEGATIVE NEGATIVE   Ketones, ur NEGATIVE NEGATIVE mg/dL   Protein, ur NEGATIVE NEGATIVE mg/dL   Nitrite NEGATIVE NEGATIVE   Leukocytes, UA NEGATIVE NEGATIVE  Pregnancy, urine POC     Status: Abnormal   Collection Time: 09/11/15 12:33 AM  Result Value Ref Range   Preg Test, Ur POSITIVE (A) NEGATIVE  Wet prep, genital     Status: Abnormal   Collection Time: 09/11/15  2:01 AM  Result Value Ref Range   Yeast Wet Prep HPF POC NONE SEEN NONE SEEN   Trich, Wet Prep NONE SEEN NONE SEEN   Clue Cells Wet Prep HPF POC PRESENT (A) NONE SEEN   WBC, Wet Prep HPF POC MODERATE (A) NONE SEEN   Sperm NONE SEEN     MAU Course  Procedures  MDM Patient refusing to see [facility] CNM 0238: Patient reports that abdominal pain is better after GI cocktail.  Assessment and Plan   1. Gastroesophageal reflux disease without esophagitis   2. Bacterial vaginosis   3. Establish gestational age, ultrasound    DC home Comfort measures reviewed  1st Trimester precautions  RX: flagyl, pepcid, phenergan  Return to MAU as needed FU with OB as planned  Follow-up Information    Schedule an appointment as soon as possible for a visit with  [facility].   Contact information:   [place] [address] [phone]       Follow up with [facility].   Specialty:  Radiology   Why:  They will call you with an appointment   Contact information:   [id number] [place] [phone]        Mathis Bud 09/11/2015, 1:22 AM

## 2015-09-18 ENCOUNTER — Encounter: Payer: Self-pay | Admitting: Obstetrics & Gynecology

## 2015-09-18 ENCOUNTER — Other Ambulatory Visit (HOSPITAL_COMMUNITY): Payer: Self-pay | Admitting: Advanced Practice Midwife

## 2015-09-18 ENCOUNTER — Ambulatory Visit (HOSPITAL_COMMUNITY)
Admission: RE | Admit: 2015-09-18 | Discharge: 2015-09-18 | Disposition: A | Discharge status: Home / Self Care | Payer: Medicaid Other | Source: Ambulatory Visit | Attending: Advanced Practice Midwife | Admitting: Advanced Practice Midwife

## 2015-09-18 ENCOUNTER — Ambulatory Visit (INDEPENDENT_AMBULATORY_CARE_PROVIDER_SITE_OTHER): Payer: Medicaid Other | Admitting: Obstetrics & Gynecology

## 2015-09-18 DIAGNOSIS — O3411 Maternal care for benign tumor of corpus uteri, first trimester: Secondary | ICD-10-CM | POA: Insufficient documentation

## 2015-09-18 DIAGNOSIS — N760 Acute vaginitis: Secondary | ICD-10-CM

## 2015-09-18 DIAGNOSIS — O208 Other hemorrhage in early pregnancy: Secondary | ICD-10-CM | POA: Insufficient documentation

## 2015-09-18 DIAGNOSIS — Z3A08 8 weeks gestation of pregnancy: Secondary | ICD-10-CM | POA: Diagnosis not present

## 2015-09-18 DIAGNOSIS — Z3491 Encounter for supervision of normal pregnancy, unspecified, first trimester: Secondary | ICD-10-CM

## 2015-09-18 DIAGNOSIS — B9689 Other specified bacterial agents as the cause of diseases classified elsewhere: Secondary | ICD-10-CM

## 2015-09-18 DIAGNOSIS — Z3689 Encounter for other specified antenatal screening: Secondary | ICD-10-CM

## 2015-09-18 DIAGNOSIS — Z3481 Encounter for supervision of other normal pregnancy, first trimester: Secondary | ICD-10-CM

## 2015-09-18 DIAGNOSIS — Z36 Encounter for antenatal screening of mother: Secondary | ICD-10-CM | POA: Insufficient documentation

## 2015-09-18 DIAGNOSIS — D252 Subserosal leiomyoma of uterus: Secondary | ICD-10-CM | POA: Diagnosis not present

## 2015-09-18 NOTE — Patient Instructions (Signed)
Prenatal Care °WHAT IS PRENATAL CARE?  °Prenatal care is the process of caring for a pregnant woman before she gives birth. Prenatal care makes sure that she and her baby remain as healthy as possible throughout pregnancy. Prenatal care may be provided by a midwife, family practice health care provider, or a childbirth and pregnancy specialist (obstetrician). Prenatal care may include physical examinations, testing, treatments, and education on nutrition, lifestyle, and social support services. °WHY IS PRENATAL CARE SO IMPORTANT?  °Early and consistent prenatal care increases the chance that you and your baby will remain healthy throughout your pregnancy. This type of care also decreases a baby's risk of being born too early (prematurely), or being born smaller than expected (small for gestational age). Any underlying medical conditions you may have that could pose a risk during your pregnancy are discussed during prenatal care visits. You will also be monitored regularly for any new conditions that may arise during your pregnancy so they can be treated quickly and effectively. °WHAT HAPPENS DURING PRENATAL CARE VISITS? °Prenatal care visits may include the following: °Discussion °Tell your health care provider about any new signs or symptoms you have experienced since your last visit. These might include: °· Nausea or vomiting. °· Increased or decreased level of energy. °· Difficulty sleeping. °· Back or leg pain. °· Weight changes. °· Frequent urination. °· Shortness of breath with physical activity. °· Changes in your skin, such as the development of a rash or itchiness. °· Vaginal discharge or bleeding. °· Feelings of excitement or nervousness. °· Changes in your baby's movements. °You may want to write down any questions or topics you want to discuss with your health care provider and bring them with you to your appointment. °Examination °During your first prenatal care visit, you will likely have a complete  physical exam. Your health care provider will often examine your vagina, cervix, and the position of your uterus, as well as check your heart, lungs, and other body systems. As your pregnancy progresses, your health care provider will measure the size of your uterus and your baby's position inside your uterus. He or she may also examine you for early signs of labor. Your prenatal visits may also include checking your blood pressure and, after about 10-12 weeks of pregnancy, listening to your baby's heartbeat. °Testing °Regular testing often includes: °· Urinalysis. This checks your urine for glucose, protein, or signs of infection. °· Blood count. This checks the levels of white and red blood cells in your body. °· Tests for sexually transmitted infections (STIs). Testing for STIs at the beginning of pregnancy is routinely done and is required in many states. °· Antibody testing. You will be checked to see if you are immune to certain illnesses, such as rubella, that can affect a developing fetus. °· Glucose screen. Around 24-28 weeks of pregnancy, your blood glucose level will be checked for signs of gestational diabetes. Follow-up tests may be recommended. °· Group B strep. This is a bacteria that is commonly found inside a woman's vagina. This test will inform your health care provider if you need an antibiotic to reduce the amount of this bacteria in your body prior to labor and childbirth. °· Ultrasound. Many pregnant women undergo an ultrasound screening around 18-20 weeks of pregnancy to evaluate the health of the fetus and check for any developmental abnormalities. °· HIV (human immunodeficiency virus) testing. Early in your pregnancy, you will be screened for HIV. If you are at high risk for HIV, this test   may be repeated during your third trimester of pregnancy. °You may be offered other testing based on your age, personal or family medical history, or other factors.  °HOW OFTEN SHOULD I PLAN TO SEE MY  HEALTH CARE PROVIDER FOR PRENATAL CARE? °Your prenatal care check-up schedule depends on any medical conditions you have before, or develop during, your pregnancy. If you do not have any underlying medical conditions, you will likely be seen for checkups: °· Monthly, during the first 6 months of pregnancy. °· Twice a month during months 7 and 8 of pregnancy. °· Weekly starting in the 9th month of pregnancy and until delivery. °If you develop signs of early labor or other concerning signs or symptoms, you may need to see your health care provider more often. Ask your health care provider what prenatal care schedule is best for you. °WHAT CAN I DO TO KEEP MYSELF AND MY BABY AS HEALTHY AS POSSIBLE DURING MY PREGNANCY? °· Take a prenatal vitamin containing 400 micrograms (0.4 mg) of folic acid every day. Your health care provider may also ask you to take additional vitamins such as iodine, vitamin D, iron, copper, and zinc. °· Take 1500-2000 mg of calcium daily starting at your 20th week of pregnancy until you deliver your baby. °· Make sure you are up to date on your vaccinations. Unless directed otherwise by your health care provider: °¨ You should receive a tetanus, diphtheria, and pertussis (Tdap) vaccination between the 27th and 36th week of your pregnancy, regardless of when your last Tdap immunization occurred. This helps protect your baby from whooping cough (pertussis) after he or she is born. °¨ You should receive an annual inactivated influenza vaccine (IIV) to help protect you and your baby from influenza. This can be done at any point during your pregnancy. °· Eat a well-rounded diet that includes: °¨ Fresh fruits and vegetables. °¨ Lean proteins. °¨ Calcium-rich foods such as milk, yogurt, hard cheeses, and dark, leafy greens. °¨ Whole grain breads. °· Do not eat seafood high in mercury, including: °¨ Swordfish. °¨ Tilefish. °¨ Shark. °¨ King mackerel. °¨ More than 6 oz tuna per week. °· Do not eat: °¨ Raw  or undercooked meats or eggs. °¨ Unpasteurized foods, such as soft cheeses (brie, blue, or feta), juices, and milks. °¨ Lunch meats. °¨ Hot dogs that have not been heated until they are steaming. °· Drink enough water to keep your urine clear or pale yellow. For many women, this may be 10 or more 8 oz glasses of water each day. Keeping yourself hydrated helps deliver nutrients to your baby and may prevent the start of pre-term uterine contractions. °· Do not use any tobacco products including cigarettes, chewing tobacco, or electronic cigarettes. If you need help quitting, ask your health care provider. °· Do not drink beverages containing alcohol. No safe level of alcohol consumption during pregnancy has been determined. °· Do not use any illegal drugs. These can harm your developing baby or cause a miscarriage. °· Ask your health care provider or pharmacist before taking any prescription or over-the-counter medicines, herbs, or supplements. °· Limit your caffeine intake to no more than 200 mg per day. °· Exercise. Unless told otherwise by your health care provider, try to get 30 minutes of moderate exercise most days of the week. Do not  do high-impact activities, contact sports, or activities with a high risk of falling, such as horseback riding or downhill skiing. °· Get plenty of rest. °· Avoid anything that raises your   body temperature, such as hot tubs and saunas. °· If you own a cat, do not empty its litter box. Bacteria contained in cat feces can cause an infection called toxoplasmosis. This can result in serious harm to the fetus. °· Stay away from chemicals such as insecticides, lead, mercury, and cleaning or paint products that contain solvents. °· Do not have any X-rays taken unless medically necessary. °· Take a childbirth and breastfeeding preparation class. Ask your health care provider if you need a referral or recommendation. °  °This information is not intended to replace advice given to you by  your health care provider. Make sure you discuss any questions you have with your health care provider. °  °Document Released: 07/28/2003 Document Revised: 08/15/2014 Document Reviewed: 10/09/2013 °Elsevier Interactive Patient Education ©2016 Else[employer] ° °

## 2015-09-18 NOTE — Progress Notes (Signed)
Ultrasounds Results Note  SUBJECTIVE HPI:  [name] is a 35 y.o. [id number] at Unknown by LMP who presents to the [facility] for followup ultrasound results. The patient denies abdominal pain or vaginal bleeding.  Upon review of the patient's records, patient was first seen in [facility] on 2.3.2017 for pain and discharge.   BHCG on that day was not done, UCG positive.  Ultrasound not done.  Repeat ultrasound was performed earlier today.   Past Medical History  Diagnosis Date  . Anemia    Past Surgical History  Procedure Laterality Date  . No past surgeries     Social History   Social History  . Marital Status: Married    Spouse Name: N/A  . Number of Children: N/A  . Years of Education: N/A   Occupational History  . Not on file.   Social History Main Topics  . Smoking status: Never Smoker   . Smokeless tobacco: Not on file  . Alcohol Use: No  . Drug Use: No  . Sexual Activity: Yes    Birth Control/ Protection: IUD   Other Topics Concern  . Not on file   Social History Narrative   Current Outpatient Prescriptions on File Prior to Visit  Medication Sig Dispense Refill  . famotidine (PEPCID) 40 MG tablet Take 1 tablet (40 mg total) by mouth daily. 30 tablet 1  . metroNIDAZOLE (FLAGYL) 500 MG tablet Take 1 tablet (500 mg total) by mouth 2 (two) times daily. 14 tablet 0  . Pediatric Multivit-Minerals-C (KIDS GUMMY BEAR VITAMINS) CHEW Chew 1 tablet by mouth daily.    . promethazine (PHENERGAN) 25 MG tablet Take 0.5-1 tablets (12.5-25 mg total) by mouth every 6 (six) hours as needed. 30 tablet 0   No current facility-administered medications on file prior to visit.   Allergies  Allergen Reactions  . Diflucan [Fluconazole]     Mouth and vagina area breaks out into hives  . Sulfamethoxazole-Trimethoprim Hives  . Latex Rash    I have reviewed patient's Past Medical Hx, Surgical Hx, Family Hx, Social Hx, medications and allergies.   Review of  Systems Review of Systems  Constitutional: Negative for fever and chills.  Gastrointestinal: Negative for nausea, vomiting, abdominal pain, diarrhea and constipation.  Genitourinary: Negative for dysuria.  Musculoskeletal: Negative for back pain.  Neurological: Negative for dizziness and weakness.    Physical Exam  LMP 08/08/2015  GENERAL: Well-developed, well-nourished female in no acute distress.  HEENT: Normocephalic, atraumatic.   LUNGS: Effort normal ABDOMEN: soft, non-tender HEART: Regular rate  SKIN: Warm, dry and without erythema PSYCH: Normal mood and affect NEURO: Alert and oriented x 4  LAB RESULTS No results found for this or any previous visit (from the past 24 hour(s)).  IMAGING US Ob Comp Less 14 Wks  09/18/2015  CLINICAL DATA:  Patient with on certain LMP. EXAM: OBSTETRIC <14 WK [place] AND TRANSVAGINAL OB US TECHNIQUE: Both transabdominal and transvaginal ultrasound examinations were performed for complete evaluation of the gestation as well as the maternal uterus, adnexal regions, and pelvic cul-de-sac. Transvaginal technique was performed to assess early pregnancy. COMPARISON:  None. FINDINGS: Intrauterine gestational sac: Visualized/normal in shape. Yolk sac:  Present Embryo:  Present Cardiac Activity: Present Heart Rate: 168  bpm CRL:  20.5  mm   8 w   5 d                  [place] EDC: 04/24/2016 Subchorionic hemorrhage:  Small subchorionic hemorrhage. Maternal  uterus/adnexae: Normal right and left ovaries. Trace free fluid in the pelvis. There is a 2.2 cm sub serosal fibroid. IMPRESSION: Single live intrauterine gestation.  Small subchorionic hemorrhage. Electronically Signed   By: Lovey Newcomer M.D.   On: 09/18/2015 10:16   US Ob Transvaginal  09/18/2015  CLINICAL DATA:  Patient with on certain LMP. EXAM: OBSTETRIC <14 WK [place] AND TRANSVAGINAL OB US TECHNIQUE: Both transabdominal and transvaginal ultrasound examinations were performed for complete evaluation of the gestation as  well as the maternal uterus, adnexal regions, and pelvic cul-de-sac. Transvaginal technique was performed to assess early pregnancy. COMPARISON:  None. FINDINGS: Intrauterine gestational sac: Visualized/normal in shape. Yolk sac:  Present Embryo:  Present Cardiac Activity: Present Heart Rate: 168  bpm CRL:  20.5  mm   8 w   5 d                  [place] EDC: 04/24/2016 Subchorionic hemorrhage:  Small subchorionic hemorrhage. Maternal uterus/adnexae: Normal right and left ovaries. Trace free fluid in the pelvis. There is a 2.2 cm sub serosal fibroid. IMPRESSION: Single live intrauterine gestation.  Small subchorionic hemorrhage. Electronically Signed   By: Lovey Newcomer M.D.   On: 09/18/2015 10:16    ASSESSMENT [redacted]w[redacted]d viable pregnancy   PLAN Discharge home in stable condition Patient advised to start/continue taking prenatal vitamins  Pregnancy confirmation letter given Patient advised to start prenatal care with OB provider of choice as soon as possible  Woodroe Mode, MD  09/18/2015  10:29 AM

## 2015-09-25 ENCOUNTER — Telehealth: Payer: Self-pay | Admitting: *Deleted

## 2015-09-25 ENCOUNTER — Other Ambulatory Visit: Payer: Self-pay

## 2015-09-25 MED ORDER — CLINDAMYCIN HCL 300 MG PO CAPS
300.0000 mg | ORAL_CAPSULE | Freq: Two times a day (BID) | ORAL | Status: DC
Start: 2015-09-25 — End: 2015-10-25

## 2015-09-25 NOTE — Telephone Encounter (Signed)
Pt would like medicine call in for BV

## 2015-09-25 NOTE — Telephone Encounter (Signed)
Returned patient called we have sent in a rx for BV to [facility]. She can pick this up today.

## 2015-09-25 NOTE — Telephone Encounter (Signed)
Pt called and stated that she has BV again and needs an rx sent in. She would like to have Clindamycin called in because she is allergic to Flagyl.

## 2015-10-06 ENCOUNTER — Telehealth: Payer: Self-pay | Admitting: *Deleted

## 2015-10-06 NOTE — Telephone Encounter (Signed)
Pt left message stating that she received Rx for Clindamycin and was taking until 2 days ago when she lost the medication. She has not been able to find it anywhere. She requests refill for the remaining pills needed to complete the course of medication. I called pt and discussed her concern. I asked if pt was taking the medication as prescribed (1 tablet BID) and she responded yes. I then stated that the Rx was sent on 2/17 and was for 1 week's worth of medication. If she was taking it as prescribed, she should have been finished with the medication by 2 days ago. Pt then stated that she may have skipped a couple doses because it is harsh on her stomach. She thinks she had 3 pills left. I advised that she most likely had enough medication to treat the BV. She should call us if sx reoccur. Her Ob appt is on 3/8 @ 0820.  Pt voiced understanding of all information and instructions given.

## 2015-10-14 ENCOUNTER — Ambulatory Visit (INDEPENDENT_AMBULATORY_CARE_PROVIDER_SITE_OTHER): Payer: Medicaid Other | Admitting: Advanced Practice Midwife

## 2015-10-14 ENCOUNTER — Encounter: Payer: Self-pay | Admitting: Advanced Practice Midwife

## 2015-10-14 ENCOUNTER — Other Ambulatory Visit (HOSPITAL_COMMUNITY)
Admission: RE | Admit: 2015-10-14 | Discharge: 2015-10-14 | Disposition: A | Discharge status: Home / Self Care | Payer: Medicaid Other | Source: Ambulatory Visit | Attending: Advanced Practice Midwife | Admitting: Advanced Practice Midwife

## 2015-10-14 VITALS — BP 122/73 | HR 100 | Temp 98.5°F | Wt 190.0 lb

## 2015-10-14 DIAGNOSIS — Z1151 Encounter for screening for human papillomavirus (HPV): Secondary | ICD-10-CM | POA: Insufficient documentation

## 2015-10-14 DIAGNOSIS — Z113 Encounter for screening for infections with a predominantly sexual mode of transmission: Secondary | ICD-10-CM | POA: Insufficient documentation

## 2015-10-14 DIAGNOSIS — Z01419 Encounter for gynecological examination (general) (routine) without abnormal findings: Secondary | ICD-10-CM | POA: Insufficient documentation

## 2015-10-14 DIAGNOSIS — O09529 Supervision of elderly multigravida, unspecified trimester: Secondary | ICD-10-CM | POA: Insufficient documentation

## 2015-10-14 DIAGNOSIS — Z3482 Encounter for supervision of other normal pregnancy, second trimester: Secondary | ICD-10-CM

## 2015-10-14 DIAGNOSIS — Z124 Encounter for screening for malignant neoplasm of cervix: Secondary | ICD-10-CM | POA: Diagnosis not present

## 2015-10-14 LAB — POCT URINALYSIS DIP (DEVICE)
BILIRUBIN URINE: NEGATIVE
Glucose, UA: NEGATIVE mg/dL
Hgb urine dipstick: NEGATIVE
Ketones, ur: NEGATIVE mg/dL
Leukocytes, UA: NEGATIVE
Nitrite: NEGATIVE
PROTEIN: NEGATIVE mg/dL
Specific Gravity, Urine: 1.02 (ref 1.005–1.030)
UROBILINOGEN UA: 0.2 mg/dL (ref 0.0–1.0)
pH: 7.5 (ref 5.0–8.0)

## 2015-10-14 NOTE — Progress Notes (Signed)
New OB. See Smart Set  Subjective:    Christy [name] is a [id number] [redacted]w[redacted]d being seen today for her first obstetrical visit.  Her obstetrical history is significant for History of anemia. Patient does intend to breast feed. Pregnancy history fully reviewed.  Patient reports no complaints.  Filed Vitals:   10/14/15 0916  BP: 122/73  Pulse: 100  Temp: 98.5 F (36.9 C)  Weight: 190 lb (86.183 kg)    HISTORY: OB History  Gravida Para Term Preterm AB SAB TAB Ectopic Multiple Living  8 6 6  0 1 1 0 0 0 6    # Outcome Date GA Lbr Len/2nd Weight Sex Delivery Anes PTL Lv  8 Current           7 Term 09/17/10 [redacted]w[redacted]d  6 lb 4 oz (2.835 kg) M Vag-Spont EPI N Y  6 Term 08/17/07 [redacted]w[redacted]d  7 lb (3.175 kg) F Vag-Spont  N Y  5 Term 11/10/04 [redacted]w[redacted]d  7 lb 8 oz (3.402 kg) M Vag-Spont None N Y  4 Term 07/23/01 [redacted]w[redacted]d   M Vag-Spont None N Y  3 Term 05/21/99 [redacted]w[redacted]d  7 lb 4 oz (3.289 kg) M Vag-Spont None N Y  2 Term 05/30/97 [redacted]w[redacted]d  6 lb 8 oz (2.948 kg) F Vag-Spont EPI N Y  1 SAB              Past Medical History  Diagnosis Date  . Anemia    Past Surgical History  Procedure Laterality Date  . No past surgeries     History reviewed. No pertinent family history.   Exam    Uterus:  Fundal Height: 12 cm  Pelvic Exam:    Perineum: No Hemorrhoids, Normal Perineum   Vulva: Bartholin's, Urethra, Skene's normal   Vagina:  normal discharge   pH:    Cervix: no cervical motion tenderness   Adnexa: no mass, fullness, tenderness   Bony Pelvis: gynecoid  System: Breast:  normal appearance, no masses or tenderness   Skin: normal coloration and turgor, no rashes    Neurologic: oriented, grossly non-focal   Extremities: normal strength, tone, and muscle mass   HEENT neck supple with midline trachea   Mouth/Teeth mucous membranes moist, pharynx normal without lesions   Neck supple and no masses   Cardiovascular: regular rate and rhythm   Respiratory:  appears well, vitals normal, no respiratory distress,  acyanotic, normal RR, ear and throat exam is normal, neck free of mass or lymphadenopathy, chest clear, no wheezing, crepitations, rhonchi, normal symmetric air entry   Abdomen: soft, non-tender; bowel sounds normal; no masses,  no organomegaly   Urinary: urethral meatus normal      Assessment:    Pregnancy: [id number] Patient Active Problem List   Diagnosis Date Noted  . Supervision of normal subsequent pregnancy 10/14/2015  . UTI symptoms 05/24/2015  . Pain in the chest 07/22/2014  . Dyspnea 07/22/2014  . Family history of coronary artery disease 07/22/2014  . MURMUR 08/31/2010  . CHEST PAIN-UNSPECIFIED 08/31/2010  . ANEMIA, IRON DEFICIENCY 08/30/2010  . PALPITATIONS 08/30/2010  . SHORTNESS OF BREATH 08/30/2010  . CHEST WALL PAIN, HX OF 08/30/2010        Plan:     Initial labs drawn. Prenatal vitamins. Problem list reviewed and updated. Genetic Screening discussed First Screen: ordered.  Ultrasound discussed; fetal survey: ordered.  Follow up in 4 weeks. 50% of 30 min visit spent on counseling and coordination of care.   Routines reviewed Reviewed practice  issues and where to go for emergencies    [facility] 10/14/2015

## 2015-10-14 NOTE — Patient Instructions (Signed)

## 2015-10-14 NOTE — Progress Notes (Signed)
Initial prenatal education packet given Breastfeeding tip of the week reviewed Initial prenatal labs today Declined flu 

## 2015-10-15 LAB — PRENATAL PROFILE (SOLSTAS)
ANTIBODY SCREEN: NEGATIVE
BASOS ABS: 0.1 10*3/uL (ref 0.0–0.1)
BASOS PCT: 1 % (ref 0–1)
EOS ABS: 0.1 10*3/uL (ref 0.0–0.7)
EOS PCT: 2 % (ref 0–5)
HEMATOCRIT: 35.7 % — AB (ref 36.0–46.0)
HEMOGLOBIN: 12 g/dL (ref 12.0–15.0)
HIV 1&2 Ab, 4th Generation: NONREACTIVE
Hepatitis B Surface Ag: NEGATIVE
Lymphocytes Relative: 30 % (ref 12–46)
Lymphs Abs: 1.6 10*3/uL (ref 0.7–4.0)
MCH: 29.5 pg (ref 26.0–34.0)
MCHC: 33.6 g/dL (ref 30.0–36.0)
MCV: 87.7 fL (ref 78.0–100.0)
MPV: 9.2 fL (ref 8.6–12.4)
Monocytes Absolute: 0.4 10*3/uL (ref 0.1–1.0)
Monocytes Relative: 8 % (ref 3–12)
NEUTROS PCT: 59 % (ref 43–77)
Neutro Abs: 3.2 10*3/uL (ref 1.7–7.7)
PLATELETS: 266 10*3/uL (ref 150–400)
RBC: 4.07 MIL/uL (ref 3.87–5.11)
RDW: 13.4 % (ref 11.5–15.5)
RH TYPE: POSITIVE
Rubella: 1.29 Index — ABNORMAL HIGH (ref <0.90)
WBC: 5.4 10*3/uL (ref 4.0–10.5)

## 2015-10-15 LAB — PRESCRIPTION MONITORING PROFILE (19 PANEL)
Amphetamine/Meth: NEGATIVE ng/mL
BARBITURATE SCREEN, URINE: NEGATIVE ng/mL
BENZODIAZEPINE SCREEN, URINE: NEGATIVE ng/mL
Buprenorphine, Urine: NEGATIVE ng/mL
COCAINE METABOLITES: NEGATIVE ng/mL
CREATININE, URINE: 128.22 mg/dL (ref >20.0)
Cannabinoid Scrn, Ur: NEGATIVE ng/mL
Carisoprodol, Urine: NEGATIVE ng/mL
Fentanyl, Ur: NEGATIVE ng/mL
MDMA URINE: NEGATIVE ng/mL
Meperidine, Ur: NEGATIVE ng/mL
Methadone Screen, Urine: NEGATIVE ng/mL
Methaqualone: NEGATIVE ng/mL
Nitrites, Initial: NEGATIVE ug/mL
OPIATE SCREEN, URINE: NEGATIVE ng/mL
Oxycodone Screen, Ur: NEGATIVE ng/mL
PH URINE, INITIAL: 7.5 pH (ref 4.5–8.9)
Phencyclidine, Ur: NEGATIVE ng/mL
Propoxyphene: NEGATIVE ng/mL
TRAMADOL UR: NEGATIVE ng/mL
Tapentadol, urine: NEGATIVE ng/mL
ZOLPIDEM, URINE: NEGATIVE ng/mL

## 2015-10-15 LAB — GC/CHLAMYDIA PROBE AMP (~~LOC~~) NOT AT ARMC
Chlamydia: NEGATIVE
NEISSERIA GONORRHEA: NEGATIVE

## 2015-10-16 LAB — CULTURE, OB URINE

## 2015-10-16 LAB — HEMOGLOBINOPATHY EVALUATION
HEMOGLOBIN OTHER: 0 %
HGB F QUANT: 0.3 % (ref 0.0–2.0)
HGB S QUANTITAION: 0 %
Hgb A2 Quant: 2.8 % (ref 2.2–3.2)
Hgb A: 96.9 % (ref 96.8–97.8)

## 2015-10-16 LAB — CYTOLOGY - PAP

## 2015-10-21 ENCOUNTER — Encounter (HOSPITAL_COMMUNITY): Payer: Self-pay

## 2015-10-21 ENCOUNTER — Ambulatory Visit (HOSPITAL_COMMUNITY)
Admission: RE | Admit: 2015-10-21 | Discharge: 2015-10-21 | Disposition: A | Discharge status: Home / Self Care | Payer: Medicaid Other | Source: Ambulatory Visit | Attending: Advanced Practice Midwife | Admitting: Advanced Practice Midwife

## 2015-10-21 DIAGNOSIS — Z36 Encounter for antenatal screening of mother: Secondary | ICD-10-CM | POA: Insufficient documentation

## 2015-10-21 DIAGNOSIS — Z3482 Encounter for supervision of other normal pregnancy, second trimester: Secondary | ICD-10-CM

## 2015-10-21 DIAGNOSIS — Z3A13 13 weeks gestation of pregnancy: Secondary | ICD-10-CM | POA: Insufficient documentation

## 2015-10-25 ENCOUNTER — Inpatient Hospital Stay (HOSPITAL_COMMUNITY)
Admission: AD | Admit: 2015-10-25 | Discharge: 2015-10-25 | Disposition: A | Discharge status: Home / Self Care | Payer: Medicaid Other | Source: Ambulatory Visit | Attending: Obstetrics and Gynecology | Admitting: Obstetrics and Gynecology

## 2015-10-25 ENCOUNTER — Encounter (HOSPITAL_COMMUNITY): Payer: Self-pay | Admitting: *Deleted

## 2015-10-25 DIAGNOSIS — R6889 Other general symptoms and signs: Secondary | ICD-10-CM | POA: Diagnosis not present

## 2015-10-25 DIAGNOSIS — J029 Acute pharyngitis, unspecified: Secondary | ICD-10-CM | POA: Diagnosis present

## 2015-10-25 DIAGNOSIS — R52 Pain, unspecified: Secondary | ICD-10-CM | POA: Diagnosis not present

## 2015-10-25 DIAGNOSIS — Z882 Allergy status to sulfonamides status: Secondary | ICD-10-CM | POA: Diagnosis not present

## 2015-10-25 DIAGNOSIS — Z91040 Latex allergy status: Secondary | ICD-10-CM | POA: Diagnosis not present

## 2015-10-25 DIAGNOSIS — Z888 Allergy status to other drugs, medicaments and biological substances status: Secondary | ICD-10-CM | POA: Insufficient documentation

## 2015-10-25 DIAGNOSIS — O99512 Diseases of the respiratory system complicating pregnancy, second trimester: Secondary | ICD-10-CM | POA: Diagnosis not present

## 2015-10-25 DIAGNOSIS — Z3A14 14 weeks gestation of pregnancy: Secondary | ICD-10-CM | POA: Insufficient documentation

## 2015-10-25 DIAGNOSIS — O26892 Other specified pregnancy related conditions, second trimester: Secondary | ICD-10-CM | POA: Diagnosis not present

## 2015-10-25 LAB — URINALYSIS, ROUTINE W REFLEX MICROSCOPIC
BILIRUBIN URINE: NEGATIVE
GLUCOSE, UA: NEGATIVE mg/dL
HGB URINE DIPSTICK: NEGATIVE
Ketones, ur: NEGATIVE mg/dL
Leukocytes, UA: NEGATIVE
Nitrite: NEGATIVE
PH: 5.5 (ref 5.0–8.0)
Protein, ur: NEGATIVE mg/dL

## 2015-10-25 LAB — RAPID STREP SCREEN (MED CTR MEBANE ONLY): Streptococcus, Group A Screen (Direct): NEGATIVE

## 2015-10-25 LAB — INFLUENZA PANEL BY PCR (TYPE A & B)
H1N1 flu by pcr: NOT DETECTED
INFLAPCR: POSITIVE — AB
INFLBPCR: NEGATIVE

## 2015-10-25 MED ORDER — OSELTAMIVIR PHOSPHATE 75 MG PO CAPS: 75.0000 mg | ORAL_CAPSULE | Freq: Two times a day (BID) | ORAL | Status: DC

## 2015-10-25 NOTE — Discharge Instructions (Signed)
Influenza, Adult Influenza ("the flu") is a viral infection of the respiratory tract. It occurs more often in winter months because people spend more time in close contact with one another. Influenza can make you feel very sick. Influenza easily spreads from person to person (contagious). CAUSES  Influenza is caused by a virus that infects the respiratory tract. You can catch the virus by breathing in droplets from an infected person's cough or sneeze. You can also catch the virus by touching something that was recently contaminated with the virus and then touching your mouth, nose, or eyes. RISKS AND COMPLICATIONS You may be at risk for a more severe case of influenza if you smoke cigarettes, have diabetes, have chronic heart disease (such as heart failure) or lung disease (such as asthma), or if you have a weakened immune system. Elderly people and pregnant women are also at risk for more serious infections. The most common problem of influenza is a lung infection (pneumonia). Sometimes, this problem can require emergency medical care and may be life threatening. SIGNS AND SYMPTOMS  Symptoms typically last 4 to 10 days and may include:  Fever.  Chills.  Headache, body aches, and muscle aches.  Sore throat.  Chest discomfort and cough.  Poor appetite.  Weakness or feeling tired.  Dizziness.  Nausea or vomiting. DIAGNOSIS  Diagnosis of influenza is often made based on your history and a physical exam. A nose or throat swab test can be done to confirm the diagnosis. TREATMENT  In mild cases, influenza goes away on its own. Treatment is directed at relieving symptoms. For more severe cases, your health care provider may prescribe antiviral medicines to shorten the sickness. Antibiotic medicines are not effective because the infection is caused by a virus, not by bacteria. HOME CARE INSTRUCTIONS  Take medicines only as directed by your health care provider.  Use a cool mist humidifier  to make breathing easier.  Get plenty of rest until your temperature returns to normal. This usually takes 3 to 4 days.  Drink enough fluid to keep your urine clear or pale yellow.  Cover yourmouth and nosewhen coughing or sneezing,and wash your handswellto prevent thevirusfrom spreading.  Stay homefromwork orschool untilthe fever is gonefor at least 47full day. PREVENTION  An annual influenza vaccination (flu shot) is the best way to avoid getting influenza. An annual flu shot is now routinely recommended for all adults in the [place] IF:  You experiencechest pain, yourcough worsens,or you producemore mucus.  Youhave nausea,vomiting, ordiarrhea.  Your fever returns or gets worse. SEEK IMMEDIATE MEDICAL CARE IF: You havetrouble breathing, you become short of breath,or your skin ornails becomebluish.  You have severe painor stiffnessin the neck.  You develop a sudden headache, or pain in the face or ear.  You have nausea or vomiting that you cannot control. MAKE SURE YOU:   Understand these instructions.  Will watch your condition.  Will get help right away if you are not doing well or get worse.   This information is not intended to replace advice given to you by your health care provider. Make sure you discuss any questions you have with your health care provider.   Document Released: 07/22/2000 Document Revised: 08/15/2014 Document Reviewed: 10/24/2011 Elsevier Interactive Patient Education 2016 [place] Medications in Pregnancy   Acne: Benzoyl Peroxide Salicylic Acid  Backache/Headache: Tylenol: 2 regular strength every 4 hours OR  2 Extra strength every 6 hours  Colds/Coughs/Allergies: Benadryl (alcohol free) 25 mg every 6 hours as needed Breath right strips Claritin Cepacol throat lozenges Chloraseptic throat spray Cold-Eeze- up to three times per day Cough drops, alcohol free Flonase (by  prescription only) Guaifenesin Mucinex Robitussin DM (plain only, alcohol free) Saline nasal spray/drops Sudafed (pseudoephedrine) & Actifed ** use only after [redacted] weeks gestation and if you do not have high blood pressure Tylenol Vicks Vaporub Zinc lozenges Zyrtec   Constipation: Colace Ducolax suppositories Fleet enema Glycerin suppositories Metamucil Milk of magnesia Miralax Senokot Smooth move tea  Diarrhea: Kaopectate Imodium A-D  *NO pepto Bismol  Hemorrhoids: Anusol Anusol HC Preparation H Tucks  Indigestion: Tums Maalox Mylanta Zantac  Pepcid  Insomnia: Benadryl (alcohol free) 25mg  every 6 hours as needed Tylenol PM Unisom, no Gelcaps  Leg Cramps: Tums MagGel  Nausea/Vomiting:  Bonine Dramamine Emetrol Ginger extract Sea bands Meclizine  Nausea medication to take during pregnancy:  Unisom (doxylamine succinate 25 mg tablets) Take one tablet daily at bedtime. If symptoms are not adequately controlled, the dose can be increased to a maximum recommended dose of two tablets daily (1/2 tablet in the morning, 1/2 tablet mid-afternoon and one at bedtime). Vitamin B6 100mg  tablets. Take one tablet twice a day (up to 200 mg per day).  Skin Rashes: Aveeno products Benadryl cream or 25mg  every 6 hours as needed Calamine Lotion 1% cortisone cream  Yeast infection: Gyne-lotrimin 7 Monistat 7  Gum/tooth pain: Anbesol  **If taking multiple medications, please check labels to avoid duplicating the same active ingredients **take medication as directed on the label ** Do not exceed 4000 mg of tylenol in 24 hours **Do not take medications that contain aspirin or ibuprofen

## 2015-10-25 NOTE — MAU Provider Note (Signed)
History     CSN: [id number]  Arrival date and time: 10/25/15 [id number]   None      Chief Complaint  Patient presents with  . Influenza   HPI Christy [name] is a 35 y.o. [id number] at [redacted]w[redacted]d who presents for body aches & sore throat. States someone coughed in her face yesterday morning and she has felt sick ever since; unknown if that person has the flu. Reports body aches, sore throat, and chills since last night. Has not checked temperature as she does not have a thermometer at home. Denies cough, ear pain, abdominal pain, or vaginal bleeding.  Has been taking theraflu since yesterday, last dose at 3 am this morning.   OB History    Gravida Para Term Preterm AB TAB SAB Ectopic Multiple Living   8 6 6  0 1 0 1 0 0 6      Past Medical History  Diagnosis Date  . Anemia     Past Surgical History  Procedure Laterality Date  . No past surgeries      History reviewed. No pertinent family history.  Social History  Substance Use Topics  . Smoking status: Never Smoker   . Smokeless tobacco: Never Used  . Alcohol Use: No    Allergies:  Allergies  Allergen Reactions  . Diflucan [Fluconazole]     Mouth and vagina area breaks out into hives  . Sulfamethoxazole-Trimethoprim Hives  . Latex Rash  . Metronidazole Rash    If takes longer than 5 days will develop a rash    Prescriptions prior to admission  Medication Sig Dispense Refill Last Dose  . clindamycin (CLEOCIN) 300 MG capsule Take 1 capsule (300 mg total) by mouth 2 (two) times daily. (Patient not taking: Reported on 10/14/2015) 14 capsule 0 Not Taking  . famotidine (PEPCID) 40 MG tablet Take 1 tablet (40 mg total) by mouth daily. (Patient not taking: Reported on 10/14/2015) 30 tablet 1 Not Taking  . metroNIDAZOLE (FLAGYL) 500 MG tablet Take 1 tablet (500 mg total) by mouth 2 (two) times daily. (Patient not taking: Reported on 10/14/2015) 14 tablet 0 Not Taking  . Pediatric Multivit-Minerals-C (KIDS GUMMY BEAR VITAMINS) CHEW Chew 1  tablet by mouth daily.   Taking  . promethazine (PHENERGAN) 25 MG tablet Take 0.5-1 tablets (12.5-25 mg total) by mouth every 6 (six) hours as needed. (Patient not taking: Reported on 10/14/2015) 30 tablet 0 Not Taking    Review of Systems  Constitutional: Positive for chills. Negative for fever (unknown).  HENT: Positive for sore throat. Negative for congestion and ear pain.   Respiratory: Negative for cough, sputum production and shortness of breath.   Cardiovascular: Negative for chest pain and palpitations.  Gastrointestinal: Negative.   Genitourinary: Negative.   Musculoskeletal: Positive for myalgias.   Physical Exam   Blood pressure 106/61, pulse 118, temperature 98.3 F (36.8 C), temperature source Oral, resp. rate 18, height 5\' 5"  (1.651 m), weight 193 lb 3.2 oz (87.635 kg), last menstrual period 08/08/2015, SpO2 99 %.  Physical Exam  Nursing note and vitals reviewed. Constitutional: She appears well-developed and well-nourished. No distress.  HENT:  Head: Normocephalic and atraumatic.  Right Ear: Tympanic membrane normal.  Left Ear: Tympanic membrane normal.  Nose: Nose normal. Right sinus exhibits no maxillary sinus tenderness and no frontal sinus tenderness. Left sinus exhibits no maxillary sinus tenderness and no frontal sinus tenderness.  Mouth/Throat: Uvula is midline. Posterior oropharyngeal erythema present. No oropharyngeal exudate or tonsillar abscesses.  Neck: Normal  range of motion. Neck supple. No tracheal deviation present. No thyromegaly present.  Cardiovascular: Regular rhythm, S1 normal and S2 normal.  Tachycardia present.   No murmur heard. Respiratory: Effort normal and breath sounds normal. No respiratory distress. She has no wheezes.  GI: Soft. There is no tenderness.  Lymphadenopathy:    She has no cervical adenopathy.  Skin: Skin is warm and dry. She is not diaphoretic.  Psychiatric: She has a normal mood and affect. Her behavior is normal. Judgment  and thought content normal.    MAU Course  Procedures Results for orders placed or performed during the hospital encounter of 10/25/15 (from the past 24 hour(s))  Urinalysis, Routine w reflex microscopic (not at [facility])     Status: Abnormal   Collection Time: 10/25/15  9:35 AM  Result Value Ref Range   Color, Urine YELLOW YELLOW   APPearance CLEAR CLEAR   Specific Gravity, Urine >1.030 (H) 1.005 - 1.030   pH 5.5 5.0 - 8.0   Glucose, UA NEGATIVE NEGATIVE mg/dL   Hgb urine dipstick NEGATIVE NEGATIVE   Bilirubin Urine NEGATIVE NEGATIVE   Ketones, ur NEGATIVE NEGATIVE mg/dL   Protein, ur NEGATIVE NEGATIVE mg/dL   Nitrite NEGATIVE NEGATIVE   Leukocytes, UA NEGATIVE NEGATIVE    MDM FHT 158 by doppler Flu & strep swabs collected Offered IV fluids to patient. Pt states she has children at home & has to get back to them. Adamant that she doesn't need IV fluids; just wants to be swabbed for flu and go home with tamiflu rx. Discussed death rates in [place] from flu & stressed importance of returning for worsening symptoms. Patient is tachycardic; denies chest pain, SOB, or palpitations.  Assessment and Plan  A:  1. Influenza-like symptoms   2. Sore throat     P: Discharge home Rx tamiflu Flu & strep swabs pending Strict return precautions OTC meds safe in pregnancy list given Stop taking theraflu  Jorje Guild 10/25/2015, 9:17 AM

## 2015-10-25 NOTE — MAU Note (Signed)
Pt states she started having a head congestion, weakness, and body aches since yesterday around 1200.  Pt states her pee is clear so she does not feel dehydrated.  Pt states she is having cold sweats.

## 2015-10-27 LAB — CULTURE, GROUP A STREP (THRC)

## 2015-10-28 ENCOUNTER — Telehealth: Payer: Self-pay | Admitting: *Deleted

## 2015-10-28 ENCOUNTER — Encounter: Payer: Self-pay | Admitting: *Deleted

## 2015-10-28 DIAGNOSIS — Z3482 Encounter for supervision of other normal pregnancy, second trimester: Secondary | ICD-10-CM

## 2015-10-28 NOTE — Telephone Encounter (Signed)
Spoke with patient who has already spoke with another provider concerning her complaint.

## 2015-10-28 NOTE — Telephone Encounter (Signed)
Pt left message stating she had been seen @ the ED and treated for flu. She has taken the antiviral medication and now having some nausea and vomiting. She wants to know if this is associated with the flu. She also wants the results of her labs.

## 2015-10-29 ENCOUNTER — Other Ambulatory Visit (HOSPITAL_COMMUNITY): Payer: Self-pay

## 2015-11-11 ENCOUNTER — Encounter: Payer: Medicaid Other | Admitting: Certified Nurse Midwife

## 2015-11-25 ENCOUNTER — Ambulatory Visit (INDEPENDENT_AMBULATORY_CARE_PROVIDER_SITE_OTHER): Payer: Medicaid Other | Admitting: Certified Nurse Midwife

## 2015-11-25 VITALS — BP 125/72 | HR 91 | Wt 193.0 lb

## 2015-11-25 DIAGNOSIS — Z3482 Encounter for supervision of other normal pregnancy, second trimester: Secondary | ICD-10-CM | POA: Diagnosis present

## 2015-11-25 DIAGNOSIS — Z3689 Encounter for other specified antenatal screening: Secondary | ICD-10-CM

## 2015-11-25 LAB — POCT URINALYSIS DIP (DEVICE)
Bilirubin Urine: NEGATIVE
Glucose, UA: NEGATIVE mg/dL
Hgb urine dipstick: NEGATIVE
Ketones, ur: NEGATIVE mg/dL
Nitrite: NEGATIVE
Protein, ur: NEGATIVE mg/dL
Specific Gravity, Urine: 1.02 (ref 1.005–1.030)
Urobilinogen, UA: 1 mg/dL (ref 0.0–1.0)
pH: 8.5 — ABNORMAL HIGH (ref 5.0–8.0)

## 2015-11-25 MED ORDER — PRENATAL VITAMINS 0.8 MG PO TABS: 1.0000 | ORAL_TABLET | Freq: Every day | ORAL | Status: DC

## 2015-11-25 NOTE — Progress Notes (Signed)
Subjective:  Christy [name] is a 35 y.o. [phone] at [redacted]w[redacted]d being seen today for ongoing prenatal care.  She is currently monitored for the following issues for this low-risk pregnancy and has ANEMIA, IRON DEFICIENCY; PALPITATIONS; MURMUR; SHORTNESS OF BREATH; CHEST PAIN-UNSPECIFIED; CHEST WALL PAIN, HX OF; Pain in the chest; Dyspnea; Family history of coronary artery disease; UTI symptoms; and Supervision of normal subsequent pregnancy on her problem list.  Patient reports no complaints. Pt declines MSAFP today. Contractions: Not present. Vag. Bleeding: None.  Movement: Present. Denies leaking of fluid.   The following portions of the patient's history were reviewed and updated as appropriate: allergies, current medications, past family history, past medical history, past social history, past surgical history and problem list. Problem list updated.  Objective:   Filed Vitals:   11/25/15 1132  BP: 125/72  Pulse: 91  Weight: 193 lb (87.544 kg)    Fetal Status: Fetal Heart Rate (bpm): 143   Movement: Present     General:  Alert, oriented and cooperative. Patient is in no acute distress.  Skin: Skin is warm and dry. No rash noted.   Cardiovascular: Normal heart rate noted  Respiratory: Normal respiratory effort, no problems with respiration noted  Abdomen: Soft, gravid, appropriate for gestational age. Pain/Pressure: Absent     Pelvic: Vag. Bleeding: None     Cervical exam deferred        Extremities: Normal range of motion.  Edema: None  Mental Status: Normal mood and affect. Normal behavior. Normal judgment and thought content.   Urinalysis: Urine Protein: Negative Urine Glucose: Negative  Assessment and Plan:  Pregnancy: [id number] at [redacted]w[redacted]d  1. Encounter for fetal anatomic survey  - [place] MFM OB COMP + 14 WK; Future  2. Encounter for supervision of other normal pregnancy in second trimester   Preterm labor symptoms and general obstetric precautions including but not limited to  vaginal bleeding, contractions, leaking of fluid and fetal movement were reviewed in detail with the patient. Please refer to After Visit Summary for other counseling recommendations.  Return in about 4 weeks (around 12/23/2015).   Larey Days, CNM

## 2015-11-25 NOTE — Patient Instructions (Signed)
Pregnancy and Zika Virus Disease Zika virus disease, or Zika, is an illness that can spread to people from mosquitoes that carry the virus. It may also spread from person to person through infected body fluids. Zika first occurred in [place], but recently it has spread to new areas. The virus occurs in tropical climates. The location of Zika continues to change. Most people who become infected with Zika virus do not develop serious illness. However, Zika may cause birth defects in an unborn baby whose mother is infected with the virus. It may also increase the risk of miscarriage. WHAT ARE THE SYMPTOMS OF ZIKA VIRUS DISEASE? In many cases, people who have been infected with Zika virus do not develop any symptoms. If symptoms appear, they usually start about a week after the person is infected. Symptoms are usually mild. They may include:  Fever.  Rash.  Red eyes.  Joint pain. HOW DOES ZIKA VIRUS DISEASE SPREAD? The main way that Zika virus spreads is through the bite of a certain type of mosquito. Unlike most types of mosquitos, which bite only at night, the type of mosquito that carries Zika virus bites both at night and during the day. Zika virus can also spread through sexual contact, through a blood transfusion, and from a mother to her baby before or during birth. Once you have had Zika virus disease, it is unlikely that you will get it again. CAN I PASS ZIKA TO MY BABY DURING PREGNANCY? Yes, Zika can pass from a mother to her baby before or during birth. WHAT PROBLEMS CAN ZIKA CAUSE FOR MY BABY? A woman who is infected with Zika virus while pregnant is at risk of having her baby born with a condition in which the brain or head is smaller than expected (microcephaly). Babies who have microcephaly can have developmental delays, seizures, hearing problems, and vision problems. Having Zika virus disease during pregnancy can also increase the risk of miscarriage. HOW CAN ZIKA VIRUS DISEASE BE  PREVENTED? There is no vaccine to prevent Zika. The best way to prevent the disease is to avoid infected mosquitoes and avoid exposure to body fluids that can spread the virus. Avoid any possible exposure to Zika by taking the following precautions. For women and their sex partners:  Avoid traveling to high-risk areas. The locations where Zika is being reported change often. To identify high-risk areas, check the CDC travel website: www.cdc.gov/zika/geo/index.html  If you or your sex partner must travel to a high-risk area, talk with a health care provider before and after traveling.  Take all precautions to avoid mosquito bites if you live in, or travel to, any of the high-risk areas. Insect repellents are safe to use during pregnancy.  Ask your health care provider when it is safe to have sexual contact. For women:  If you are pregnant or trying to become pregnant, avoid sexual contact with persons who may have been exposed to Zika virus, persons who have possible symptoms of Zika, or persons whose history you are unsure about. If you choose to have sexual contact with someone who may have been exposed to Zika virus, use condoms correctly during the entire duration of sexual activity, every time. Do not share sexual devices, as you may be exposed to body fluids.  Ask your health care provider about when it is safe to attempt pregnancy after a possible exposure to Zika virus. WHAT STEPS SHOULD I TAKE TO AVOID MOSQUITO BITES? Take these steps to avoid mosquito bites when you are   in a high-risk area:  Wear loose clothing that covers your arms and legs.  Limit your outdoor activities.  Do not open windows unless they have window screens.  Sleep under mosquito nets.  Use insect repellent. The best insect repellents have:  DEET, picaridin, oil of lemon eucalyptus (OLE), or IR3535 in them.  Higher amounts of an active ingredient in them.  Remember that insect repellents are safe to use  during pregnancy.  Do not use OLE on children who are younger than 94 years of age. Do not use insect repellent on babies who are younger than 80 months of age.  Cover your child's stroller with mosquito netting. Make sure the netting fits snugly and that any loose netting does not cover your child's mouth or nose. Do not use a blanket as a mosquito-protection cover.  Do not apply insect repellent underneath clothing.  If you are using sunscreen, apply the sunscreen before applying the insect repellent.  Treat clothing with permethrin. Do not apply permethrin directly to your skin. Follow label directions for safe use.  Get rid of standing water, where mosquitoes may reproduce. Standing water is often found in items such as buckets, bowls, animal food dishes, and flowerpots. When you return from traveling to any high-risk area, continue taking actions to protect yourself against mosquito bites for 3 weeks, even if you show no signs of illness. This will prevent spreading Zika virus to uninfected mosquitoes. WHAT SHOULD I KNOW ABOUT THE SEXUAL TRANSMISSION OF ZIKA? People can spread Zika to their sexual partners during vaginal, anal, or oral sex, or by sharing sexual devices. Many people with [place] do not develop symptoms, so a person could spread the disease without knowing that they are infected. The greatest risk is to women who are pregnant or who may become pregnant. Zika virus can live longer in semen than it can live in blood. Couples can prevent sexual transmission of the virus by:  Using condoms correctly during the entire duration of sexual activity, every time. This includes vaginal, anal, and oral sex.  Not sharing sexual devices. Sharing increases your risk of being exposed to body fluid from another person.  Avoiding all sexual activity until your health care provider says it is safe. SHOULD I BE TESTED FOR ZIKA VIRUS? A sample of your blood can be tested for Zika virus. A pregnant  woman should be tested if she may have been exposed to the virus or if she has symptoms of Zika. She may also have additional tests done during her pregnancy, such ultrasound testing. Talk with your health care provider about which tests are recommended.   This information is not intended to replace advice given to you by your health care provider. Make sure you discuss any questions you have with your health care provider.   Document Released: 04/15/2015 Document Reviewed: 04/08/2015 Elsevier Interactive Patient Education [employer].

## 2015-12-02 ENCOUNTER — Ambulatory Visit (HOSPITAL_COMMUNITY)
Admission: RE | Admit: 2015-12-02 | Discharge: 2015-12-02 | Disposition: A | Discharge status: Home / Self Care | Payer: Medicaid Other | Source: Ambulatory Visit | Attending: Certified Nurse Midwife | Admitting: Certified Nurse Midwife

## 2015-12-02 ENCOUNTER — Other Ambulatory Visit: Payer: Self-pay | Admitting: General Practice

## 2015-12-02 DIAGNOSIS — Z3A19 19 weeks gestation of pregnancy: Secondary | ICD-10-CM

## 2015-12-02 DIAGNOSIS — Z36 Encounter for antenatal screening of mother: Secondary | ICD-10-CM | POA: Diagnosis not present

## 2015-12-02 DIAGNOSIS — O0942 Supervision of pregnancy with grand multiparity, second trimester: Secondary | ICD-10-CM

## 2015-12-02 DIAGNOSIS — Z3689 Encounter for other specified antenatal screening: Secondary | ICD-10-CM

## 2015-12-14 ENCOUNTER — Telehealth: Payer: Self-pay | Admitting: *Deleted

## 2015-12-14 NOTE — Telephone Encounter (Signed)
Christy [name] left a voicemail message this am stating she is 5 months pregnant- craving ice all the time- not sure if her iron  Is low. States thinks she has a pinched nurse- States she fell twice to her knees but didn't come to the hosptial because She thought ok. States if she lays a certain way she feels pain and like she can't move.   Called [name] and she states she is feeling the baby move well, denies any bleeding States she did not fall because Of pain- was due to concrete loose, etc.. States she is not in pain all the time- Only when she is lying down a certain way- then she has to wait to move due to pain - we discussed this could be sciatic nerve  Pain from baby's position.   Discussed with provider and encouraged patient to keep next appt as scheduled, consider using a Maternity belt as she may have this issue until delivery. Also discussed that we could check her hgb at next appt- in the Mean time take pnv and eat iron rich foods. Also encouraged her if she falls again- should come to [facility].

## 2015-12-25 ENCOUNTER — Ambulatory Visit (INDEPENDENT_AMBULATORY_CARE_PROVIDER_SITE_OTHER): Payer: Medicaid Other | Admitting: Family Medicine

## 2015-12-25 VITALS — BP 128/62 | HR 92 | Wt 195.0 lb

## 2015-12-25 DIAGNOSIS — H04203 Unspecified epiphora, bilateral lacrimal glands: Secondary | ICD-10-CM

## 2015-12-25 DIAGNOSIS — Z3482 Encounter for supervision of other normal pregnancy, second trimester: Secondary | ICD-10-CM | POA: Diagnosis present

## 2015-12-25 LAB — POCT URINALYSIS DIP (DEVICE)
BILIRUBIN URINE: NEGATIVE
Glucose, UA: NEGATIVE mg/dL
HGB URINE DIPSTICK: NEGATIVE
Ketones, ur: NEGATIVE mg/dL
Leukocytes, UA: NEGATIVE
Nitrite: NEGATIVE
PH: 7 (ref 5.0–8.0)
Protein, ur: 30 mg/dL — AB
SPECIFIC GRAVITY, URINE: 1.02 (ref 1.005–1.030)
Urobilinogen, UA: 1 mg/dL (ref 0.0–1.0)

## 2015-12-25 NOTE — Progress Notes (Signed)
Subjective:  Christy [name] is a 35 y.o. [phone] at [redacted]w[redacted]d being seen today for ongoing prenatal care.  She is currently monitored for the following issues for this low-risk pregnancy and has ANEMIA, IRON DEFICIENCY; PALPITATIONS; MURMUR; SHORTNESS OF BREATH; CHEST PAIN-UNSPECIFIED; CHEST WALL PAIN, HX OF; Pain in the chest; Dyspnea; Family history of coronary artery disease; UTI symptoms; and Supervision of normal subsequent pregnancy on her problem list.  Patient reports eye irritation.  Called optometrist, who told her to see her OB doctor.  Wakes in AM with Mucus in eye.  Just put in new pair of contacts on Monday.  Has a lot of watering in left eye - no pain or itching.  Contractions: Not present. Vag. Bleeding: None.  Movement: Present. Denies leaking of fluid.   The following portions of the patient's history were reviewed and updated as appropriate: allergies, current medications, past family history, past medical history, past social history, past surgical history and problem list. Problem list updated.  Objective:   Filed Vitals:   12/25/15 1113  BP: 128/62  Pulse: 92  Weight: 195 lb (88.451 kg)    Fetal Status: Fetal Heart Rate (bpm): 145   Movement: Present     General:  Alert, oriented and cooperative. Patient is in no acute distress.  Eyes: No erythema.  EOMI.  Conjunctiva normal  Skin: Skin is warm and dry. No rash noted.   Cardiovascular: Normal heart rate noted  Respiratory: Normal respiratory effort, no problems with respiration noted  Abdomen: Soft, gravid, appropriate for gestational age. Pain/Pressure: Absent     Pelvic: Vag. Bleeding: None     Cervical exam deferred        Extremities: Normal range of motion.  Edema: None  Mental Status: Normal mood and affect. Normal behavior. Normal judgment and thought content.   Urinalysis:      Assessment and Plan:  Pregnancy: [id number] at [redacted]w[redacted]d  1. Encounter for supervision of other normal pregnancy in second trimester FHT  and Fh normal.  [place] reviewed - normal anatomy  2.  Watery Eyes: No evidence of conjunctivitis.  Patient to see optometrist.  Recommended trial of claritin as pt has history of allergies. Preterm labor symptoms and general obstetric precautions including but not limited to vaginal bleeding, contractions, leaking of fluid and fetal movement were reviewed in detail with the patient. Please refer to After Visit Summary for other counseling recommendations.  No Follow-up on file.   Truett Mainland, DO

## 2015-12-25 NOTE — Progress Notes (Signed)
Reviewed tip of week with patient  

## 2015-12-30 ENCOUNTER — Telehealth: Payer: Self-pay | Admitting: *Deleted

## 2015-12-30 NOTE — Telephone Encounter (Signed)
Pt left message stating that she has been having a funny feeling since yesterday. It feels like when she had a stomach ulcer in the past or like balling up but she does not think it is contractions. She requests a call back.

## 2015-12-31 NOTE — Telephone Encounter (Signed)
Called patient and she states she is feeling better today. Patient states she kept feeling this balling up and letting go sensation that would not go away all afternoon that felt like when she had a stomach ulcer in the past & felt like it might have been contractions. Patient states she drank a gallon of water & everything went away. Patient states maybe she was dehydrated & that's why she was contracting. Patient had no questions or concerns today

## 2016-01-08 ENCOUNTER — Telehealth: Payer: Self-pay | Admitting: *Deleted

## 2016-01-08 DIAGNOSIS — K5900 Constipation, unspecified: Secondary | ICD-10-CM

## 2016-01-08 DIAGNOSIS — O99612 Diseases of the digestive system complicating pregnancy, second trimester: Principal | ICD-10-CM

## 2016-01-08 MED ORDER — DOCUSATE SODIUM 100 MG PO CAPS: 100.0000 mg | ORAL_CAPSULE | Freq: Two times a day (BID) | ORAL | Status: DC

## 2016-01-08 NOTE — Telephone Encounter (Signed)
Patient called front desk and call transferred to nurse line. She reports poor appetite. Denies any nausea , vomiting, reflux.  We discussed there is no supplement we give to stimulate appetite. We discussed eating 5 small meals or snacks a day and to try to eat healthy as possible.  We discussed she could buy ensure or generic equivalent. We discussed at her next visit need to look at her weight gain and discuss with provider. She also c/o constipation- states having bowel movements- but sometimes difficult. We discussed taking colace once a day and if that doesn't work take it bid. She voices understanding.

## 2016-01-20 ENCOUNTER — Encounter: Payer: Medicaid Other | Admitting: Family

## 2016-01-26 ENCOUNTER — Other Ambulatory Visit: Payer: Medicaid Other

## 2016-02-01 ENCOUNTER — Ambulatory Visit (INDEPENDENT_AMBULATORY_CARE_PROVIDER_SITE_OTHER): Payer: Medicaid Other | Admitting: Obstetrics and Gynecology

## 2016-02-01 ENCOUNTER — Encounter: Payer: Self-pay | Admitting: Obstetrics and Gynecology

## 2016-02-01 VITALS — BP 117/63 | HR 100 | Wt 196.1 lb

## 2016-02-01 DIAGNOSIS — Z3483 Encounter for supervision of other normal pregnancy, third trimester: Secondary | ICD-10-CM

## 2016-02-01 DIAGNOSIS — Z23 Encounter for immunization: Secondary | ICD-10-CM | POA: Diagnosis not present

## 2016-02-01 LAB — POCT URINALYSIS DIP (DEVICE)
BILIRUBIN URINE: NEGATIVE
Glucose, UA: NEGATIVE mg/dL
HGB URINE DIPSTICK: NEGATIVE
KETONES UR: NEGATIVE mg/dL
Leukocytes, UA: NEGATIVE
Nitrite: NEGATIVE
Protein, ur: NEGATIVE mg/dL
SPECIFIC GRAVITY, URINE: 1.02 (ref 1.005–1.030)
UROBILINOGEN UA: 0.2 mg/dL (ref 0.0–1.0)
pH: 6.5 (ref 5.0–8.0)

## 2016-02-01 MED ORDER — TETANUS-DIPHTH-ACELL PERTUSSIS 5-2.5-18.5 LF-MCG/0.5 IM SUSP
0.5000 mL | Freq: Once | INTRAMUSCULAR | Status: AC
  Administered 2016-02-01: 0.5 mL via INTRAMUSCULAR

## 2016-02-01 NOTE — Progress Notes (Signed)
Subjective:  Christy [name] is a 35 y.o. [phone] at [redacted]w[redacted]d being seen today for ongoing prenatal care.  She is currently monitored for the following issues for this low-risk pregnancy and has ANEMIA, IRON DEFICIENCY; PALPITATIONS; MURMUR; SHORTNESS OF BREATH; CHEST PAIN-UNSPECIFIED; CHEST WALL PAIN, HX OF; Pain in the chest; Dyspnea; Family history of coronary artery disease; UTI symptoms; and Supervision of normal subsequent pregnancy on her problem list.  Patient reports no complaints.  Contractions: Not present.  .  Movement: Present. Denies leaking of fluid.   The following portions of the patient's history were reviewed and updated as appropriate: allergies, current medications, past family history, past medical history, past social history, past surgical history and problem list. Problem list updated.  Objective:   Filed Vitals:   02/01/16 1031  BP: 117/63  Pulse: 100  Weight: 196 lb 1.6 oz (88.95 kg)    Fetal Status: Fetal Heart Rate (bpm): 150 Fundal Height: 28 cm Movement: Present     General:  Alert, oriented and cooperative. Patient is in no acute distress.  Skin: Skin is warm and dry. No rash noted.   Cardiovascular: Normal heart rate noted  Respiratory: Normal respiratory effort, no problems with respiration noted  Abdomen: Soft, gravid, appropriate for gestational age. Pain/Pressure: Absent     Pelvic: Cervical exam deferred        Extremities: Normal range of motion.  Edema: None  Mental Status: Normal mood and affect. Normal behavior. Normal judgment and thought content.   Urinalysis: Urine Protein: Negative Urine Glucose: Negative  Assessment and Plan:  Pregnancy: [id number] at [redacted]w[redacted]d  1. Encounter for supervision of other normal pregnancy in third trimester Patient is doing well Third trimester labs today - Tdap (BOOSTRIX) injection 0.5 mL; Inject 0.5 mLs into the muscle once. - CBC - HIV antibody - RPR - Glucose Tolerance, 1 HR (50g)  Preterm labor symptoms and  general obstetric precautions including but not limited to vaginal bleeding, contractions, leaking of fluid and fetal movement were reviewed in detail with the patient. Please refer to After Visit Summary for other counseling recommendations.  No Follow-up on file.   Mora Bellman, MD

## 2016-02-01 NOTE — Progress Notes (Signed)
1 HOUR GTT GIVEN

## 2016-02-02 LAB — CBC
HCT: 31.8 % — ABNORMAL LOW (ref 35.0–45.0)
Hemoglobin: 10.4 g/dL — ABNORMAL LOW (ref 11.7–15.5)
MCH: 28.4 pg (ref 27.0–33.0)
MCHC: 32.7 g/dL (ref 32.0–36.0)
MCV: 86.9 fL (ref 80.0–100.0)
MPV: 9.2 fL (ref 7.5–12.5)
PLATELETS: 235 10*3/uL (ref 140–400)
RBC: 3.66 MIL/uL — AB (ref 3.80–5.10)
RDW: 14.4 % (ref 11.0–15.0)
WBC: 6.2 10*3/uL (ref 3.8–10.8)

## 2016-02-02 LAB — GLUCOSE TOLERANCE, 1 HOUR (50G) W/O FASTING: GLUCOSE, 1 HR, GESTATIONAL: 109 mg/dL (ref <140)

## 2016-02-02 LAB — HIV ANTIBODY (ROUTINE TESTING W REFLEX): HIV: NONREACTIVE

## 2016-02-02 LAB — RPR

## 2016-02-04 ENCOUNTER — Telehealth: Payer: Self-pay | Admitting: *Deleted

## 2016-02-04 NOTE — Telephone Encounter (Signed)
Patient left message with call center looking for results of her iron and sugar test. 1hr gtt normal, hgb 10.4, down from 12 3 months ago. Spoke with Shawnie Dapper Karim,CNM who recommended patient start taking otc iron 1 tab daily. Called patient and relayed this information to her. Understanding was voiced and she had no further questions.

## 2016-02-12 ENCOUNTER — Telehealth: Payer: Self-pay | Admitting: General Practice

## 2016-02-12 NOTE — Telephone Encounter (Signed)
Patient called and left message requesting call back so that she can talk to someone about her circulation problems

## 2016-02-18 ENCOUNTER — Encounter: Payer: Medicaid Other | Admitting: Advanced Practice Midwife

## 2016-02-25 ENCOUNTER — Inpatient Hospital Stay (HOSPITAL_COMMUNITY): Payer: Medicaid Other

## 2016-02-25 ENCOUNTER — Inpatient Hospital Stay (HOSPITAL_COMMUNITY)
Admission: AD | Admit: 2016-02-25 | Discharge: 2016-02-25 | Disposition: A | Discharge status: Home / Self Care | Payer: Medicaid Other | Source: Ambulatory Visit | Attending: Family Medicine | Admitting: Family Medicine

## 2016-02-25 ENCOUNTER — Encounter: Payer: Medicaid Other | Admitting: Family

## 2016-02-25 DIAGNOSIS — R1011 Right upper quadrant pain: Secondary | ICD-10-CM | POA: Insufficient documentation

## 2016-02-25 DIAGNOSIS — Z79899 Other long term (current) drug therapy: Secondary | ICD-10-CM | POA: Diagnosis not present

## 2016-02-25 DIAGNOSIS — Z3483 Encounter for supervision of other normal pregnancy, third trimester: Secondary | ICD-10-CM

## 2016-02-25 DIAGNOSIS — O26899 Other specified pregnancy related conditions, unspecified trimester: Secondary | ICD-10-CM

## 2016-02-25 DIAGNOSIS — R109 Unspecified abdominal pain: Secondary | ICD-10-CM

## 2016-02-25 LAB — COMPREHENSIVE METABOLIC PANEL
ALK PHOS: 122 U/L (ref 38–126)
ALT: 25 U/L (ref 14–54)
ANION GAP: 10 (ref 5–15)
AST: 26 U/L (ref 15–41)
Albumin: 2.8 g/dL — ABNORMAL LOW (ref 3.5–5.0)
BILIRUBIN TOTAL: 0.2 mg/dL — AB (ref 0.3–1.2)
BUN: 5 mg/dL — ABNORMAL LOW (ref 6–20)
CALCIUM: 8.8 mg/dL — AB (ref 8.9–10.3)
CO2: 20 mmol/L — AB (ref 22–32)
Chloride: 104 mmol/L (ref 101–111)
Creatinine, Ser: 0.58 mg/dL (ref 0.44–1.00)
GFR calc non Af Amer: >60 mL/min (ref >60)
Glucose, Bld: 76 mg/dL (ref 65–99)
POTASSIUM: 3.2 mmol/L — AB (ref 3.5–5.1)
SODIUM: 134 mmol/L — AB (ref 135–145)
TOTAL PROTEIN: 6.3 g/dL — AB (ref 6.5–8.1)

## 2016-02-25 LAB — CBC
HEMATOCRIT: 33.2 % — AB (ref 36.0–46.0)
HEMOGLOBIN: 11.2 g/dL — AB (ref 12.0–15.0)
MCH: 28.1 pg (ref 26.0–34.0)
MCHC: 33.7 g/dL (ref 30.0–36.0)
MCV: 83.4 fL (ref 78.0–100.0)
Platelets: 243 10*3/uL (ref 150–400)
RBC: 3.98 MIL/uL (ref 3.87–5.11)
RDW: 13.4 % (ref 11.5–15.5)
WBC: 6.6 10*3/uL (ref 4.0–10.5)

## 2016-02-25 LAB — URINALYSIS, ROUTINE W REFLEX MICROSCOPIC
BILIRUBIN URINE: NEGATIVE
Glucose, UA: NEGATIVE mg/dL
Hgb urine dipstick: NEGATIVE
KETONES UR: 15 mg/dL — AB
Leukocytes, UA: NEGATIVE
NITRITE: NEGATIVE
PROTEIN: NEGATIVE mg/dL
Specific Gravity, Urine: 1.02 (ref 1.005–1.030)
pH: 6 (ref 5.0–8.0)

## 2016-02-25 LAB — LIPASE, BLOOD: LIPASE: 38 U/L (ref 11–51)

## 2016-02-25 MED ORDER — HYOSCYAMINE SULFATE 0.125 MG SL SUBL: 0.1250 mg | SUBLINGUAL_TABLET | Freq: Once | SUBLINGUAL | Status: DC

## 2016-02-25 MED ORDER — FENTANYL CITRATE (PF) 100 MCG/2ML IJ SOLN
100.0000 ug | Freq: Once | INTRAMUSCULAR | Status: AC
  Administered 2016-02-25: 100 ug via INTRAMUSCULAR
  Filled 2016-02-25: qty 2

## 2016-02-25 MED ORDER — HYOSCYAMINE SULFATE 0.125 MG SL SUBL
0.1250 mg | SUBLINGUAL_TABLET | Freq: Once | SUBLINGUAL | Status: AC
  Administered 2016-02-25: 0.125 mg via SUBLINGUAL
  Filled 2016-02-25: qty 1

## 2016-02-25 MED ORDER — HYDROMORPHONE HCL 1 MG/ML IJ SOLN
1.0000 mg | Freq: Once | INTRAMUSCULAR | Status: AC
Start: 2016-02-25 — End: 2016-02-25
  Administered 2016-02-25: 1 mg via INTRAMUSCULAR
  Filled 2016-02-25: qty 1

## 2016-02-25 NOTE — MAU Note (Signed)
Pt received to MAU c/o upper abdominal pain which radiates to the mid lower part of her abdomen. Pt states this pain started after her husband had given a gentle pull when hurrying to cross a busy street. Pt states this happened over this past weekend. Denies vaginal bleeding or leaking of amniotic fluid. Abdomen soft on palpation. Toya Smothers, RN

## 2016-02-25 NOTE — MAU Provider Note (Signed)
History     CSN: [id number]  Arrival date and time: 02/25/16 1056   First Provider Initiated Contact with Patient 02/25/16 1239      Chief Complaint  Patient presents with  . Abdominal Pain   HPI  [id number] who presents to MAU with several days of epidgatric and RUQ pain following a near fall this weekend. At first she thought it was just a pulled muscle but it is not getting better. She reports the pain is 8/10. She has had one loose BM. She denies nausea or vomiting. She has had no fevers or chills. No one is sick around her. She did recently travel to a resort in [place] for 1 wk. Pt was  She denies reflux symptoms .  Past Medical History  Diagnosis Date  . Anemia     Past Surgical History  Procedure Laterality Date  . No past surgeries      No family history on file.  Social History  Substance Use Topics  . Smoking status: Never Smoker   . Smokeless tobacco: Never Used  . Alcohol Use: No    Allergies:  Allergies  Allergen Reactions  . Diflucan [Fluconazole]     Mouth and vagina area breaks out into hives  . Sulfamethoxazole-Trimethoprim Hives  . Latex Rash  . Metronidazole Rash    If takes longer than 5 days will develop a rash    Prescriptions prior to admission  Medication Sig Dispense Refill Last Dose  . bismuth subsalicylate (PEPTO BISMOL) 262 MG/15ML suspension Take 30 mLs by mouth once.    02/25/2016 at Unknown time  . Prenatal Multivit-Min-Fe-FA (PRENATAL VITAMINS) 0.8 MG tablet Take 1 tablet by mouth daily. 30 tablet 12 Past Week at Unknown time  . docusate sodium (COLACE) 100 MG capsule Take 1 capsule (100 mg total) by mouth 2 (two) times daily. (Patient not taking: Reported on 02/25/2016) 30 capsule 1 Taking    Review of Systems  Constitutional: Negative for fever, chills and weight loss.  Eyes: Negative for blurred vision, double vision and photophobia.  Respiratory: Negative for cough, hemoptysis and sputum production.   Cardiovascular: Negative for  chest pain, palpitations and orthopnea.  Gastrointestinal: Positive for abdominal pain and diarrhea. Negative for heartburn, nausea, vomiting and blood in stool.  Genitourinary: Negative for dysuria, urgency and frequency.  Skin: Negative for itching and rash.  Endo/Heme/Allergies: Negative for environmental allergies. Does not bruise/bleed easily.   Physical Exam   Blood pressure 119/76, pulse 96, temperature 98 F (36.7 C), temperature source Oral, resp. rate 18, height 5\' 4"  (1.626 m), weight 200 lb (90.719 kg), last menstrual period 08/08/2015.  Physical Exam  Constitutional: She appears well-developed and well-nourished.  HENT:  Head: Normocephalic and atraumatic.  Eyes: Pupils are equal, round, and reactive to light.  Cardiovascular: Normal rate and regular rhythm.   Respiratory: Effort normal and breath sounds normal.  GI: Soft. There is tenderness in the right upper quadrant, epigastric area and left upper quadrant. There is no rigidity, no guarding, no tenderness at McBurney's point and negative Murphy's sign.    MAU Course  Procedures In MAU pt received IM Fentanyl and Dilaudid with relief of her pain. She also had levsin which seemed to help. CMP, Lipase and CBC are WNL. No signficant findings on RUQ [place]. Pt ok for Discharge home to follow up in clinic if symptoms worsen Assessment and Plan  Viral gastro-enteritis versus pulled muscle. D/C pt home on Levsin. Follow up as scheduled with clinic.  Christy [name] 02/25/2016, 2:22 PM

## 2016-02-25 NOTE — MAU Note (Signed)
Pt reports she has been having a constant upper abd pain since last week reports  having  Occasional contractionsas well

## 2016-02-25 NOTE — Progress Notes (Signed)
Providers at bedside. Pt refusing to be placed back on fetal monitoring due to her pain. [place]

## 2016-02-25 NOTE — Discharge Instructions (Signed)
Abdominal Pain, Adult Many things can cause abdominal pain. Usually, abdominal pain is not caused by a disease and will improve without treatment. It can often be observed and treated at home. Your health care provider will do a physical exam and possibly order blood tests and X-rays to help determine the seriousness of your pain. However, in many cases, more time must pass before a clear cause of the pain can be found. Before that point, your health care provider may not know if you need more testing or further treatment. HOME CARE INSTRUCTIONS Monitor your abdominal pain for any changes. The following actions may help to alleviate any discomfort you are experiencing:  Only take over-the-counter or prescription medicines as directed by your health care provider.  Do not take laxatives unless directed to do so by your health care provider.  Try a clear liquid diet (broth, tea, or water) as directed by your health care provider. Slowly move to a bland diet as tolerated. SEEK MEDICAL CARE IF:  You have unexplained abdominal pain.  You have abdominal pain associated with nausea or diarrhea.  You have pain when you urinate or have a bowel movement.  You experience abdominal pain that wakes you in the night.  You have abdominal pain that is worsened or improved by eating food.  You have abdominal pain that is worsened with eating fatty foods.  You have a fever. SEEK IMMEDIATE MEDICAL CARE IF:  Your pain does not go away within 2 hours.  You keep throwing up (vomiting).  Your pain is felt only in portions of the abdomen, such as the right side or the left lower portion of the abdomen.  You pass bloody or black tarry stools. MAKE SURE YOU:  Understand these instructions.  Will watch your condition.  Will get help right away if you are not doing well or get worse.   This information is not intended to replace advice given to you by your health care provider. Make sure you discuss  any questions you have with your health care provider.   Document Released: 05/04/2005 Document Revised: 04/15/2015 Document Reviewed: 04/03/2013 Elsevier Interactive Patient Education 2016 [place]. Gastroesophageal Reflux Disease, Adult Normally, food travels down the esophagus and stays in the stomach to be digested. However, when a person has gastroesophageal reflux disease (GERD), food and stomach acid move back up into the esophagus. When this happens, the esophagus becomes sore and inflamed. Over time, GERD can create small holes (ulcers) in the lining of the esophagus.  CAUSES This condition is caused by a problem with the muscle between the esophagus and the stomach (lower esophageal sphincter, or LES). Normally, the LES muscle closes after food passes through the esophagus to the stomach. When the LES is weakened or abnormal, it does not close properly, and that allows food and stomach acid to go back up into the esophagus. The LES can be weakened by certain dietary substances, medicines, and medical conditions, including:  Tobacco use.  Pregnancy.  Having a hiatal hernia.  Heavy alcohol use.  Certain foods and beverages, such as coffee, chocolate, onions, and peppermint. RISK FACTORS This condition is more likely to develop in:  People who have an increased body weight.  People who have connective tissue disorders.  People who use NSAID medicines. SYMPTOMS Symptoms of this condition include:  Heartburn.  Difficult or painful swallowing.  The feeling of having a lump in the throat.  Abitter taste in the mouth.  Bad breath.  Having a  large amount of saliva.  Having an upset or bloated stomach.  Belching.  Chest pain.  Shortness of breath or wheezing.  Ongoing (chronic) cough or a night-time cough.  Wearing away of tooth enamel.  Weight loss. Different conditions can cause chest pain. Make sure to see your health care provider if you experience  chest pain. DIAGNOSIS Your health care provider will take a medical history and perform a physical exam. To determine if you have mild or severe GERD, your health care provider may also monitor how you respond to treatment. You may also have other tests, including:  An endoscopy toexamine your stomach and esophagus with a small camera.  A test thatmeasures the acidity level in your esophagus.  A test thatmeasures how much pressure is on your esophagus.  A barium swallow or modified barium swallow to show the shape, size, and functioning of your esophagus. TREATMENT The goal of treatment is to help relieve your symptoms and to prevent complications. Treatment for this condition may vary depending on how severe your symptoms are. Your health care provider may recommend:  Changes to your diet.  Medicine.  Surgery. HOME CARE INSTRUCTIONS Diet  Follow a diet as recommended by your health care provider. This may involve avoiding foods and drinks such as:  Coffee and tea (with or without caffeine).  Drinks that containalcohol.  Energy drinks and sports drinks.  Carbonated drinks or sodas.  Chocolate and cocoa.  Peppermint and mint flavorings.  Garlic and onions.  Horseradish.  Spicy and acidic foods, including peppers, chili powder, curry powder, vinegar, hot sauces, and barbecue sauce.  Citrus fruit juices and citrus fruits, such as oranges, lemons, and limes.  Tomato-based foods, such as red sauce, chili, salsa, and pizza with red sauce.  Fried and fatty foods, such as donuts, french fries, potato chips, and high-fat dressings.  High-fat meats, such as hot dogs and fatty cuts of red and white meats, such as rib eye steak, sausage, ham, and bacon.  High-fat dairy items, such as whole milk, butter, and cream cheese.  Eat small, frequent meals instead of large meals.  Avoid drinking large amounts of liquid with your meals.  Avoid eating meals during the 2-3 hours  before bedtime.  Avoid lying down right after you eat.  Do not exercise right after you eat. General Instructions  Pay attention to any changes in your symptoms.  Take over-the-counter and prescription medicines only as told by your health care provider. Do not take aspirin, ibuprofen, or other NSAIDs unless your health care provider told you to do so.  Do not use any tobacco products, including cigarettes, chewing tobacco, and e-cigarettes. If you need help quitting, ask your health care provider.  Wear loose-fitting clothing. Do not wear anything tight around your waist that causes pressure on your abdomen.  Raise (elevate) the head of your bed 6 inches (15cm).  Try to reduce your stress, such as with yoga or meditation. If you need help reducing stress, ask your health care provider.  If you are overweight, reduce your weight to an amount that is healthy for you. Ask your health care provider for guidance about a safe weight loss goal.  Keep all follow-up visits as told by your health care provider. This is important. SEEK MEDICAL CARE IF:  You have new symptoms.  You have unexplained weight loss.  You have difficulty swallowing, or it hurts to swallow.  You have wheezing or a persistent cough.  Your symptoms do not improve  with treatment.  You have a hoarse voice. SEEK IMMEDIATE MEDICAL CARE IF:  You have pain in your arms, neck, jaw, teeth, or back.  You feel sweaty, dizzy, or light-headed.  You have chest pain or shortness of breath.  You vomit and your vomit looks like blood or coffee grounds.  You faint.  Your stool is bloody or black.  You cannot swallow, drink, or eat.   This information is not intended to replace advice given to you by your health care provider. Make sure you discuss any questions you have with your health care provider.   Document Released: 05/04/2005 Document Revised: 04/15/2015 Document Reviewed: 11/19/2014 Elsevier Interactive  Patient Education 2016 [place]. Muscle Strain A muscle strain is an injury that occurs when a muscle is stretched beyond its normal length. Usually a small number of muscle fibers are torn when this happens. Muscle strain is rated in degrees. First-degree strains have the least amount of muscle fiber tearing and pain. Second-degree and third-degree strains have increasingly more tearing and pain.  Usually, recovery from muscle strain takes 1-2 weeks. Complete healing takes 5-6 weeks.  CAUSES  Muscle strain happens when a sudden, violent force placed on a muscle stretches it too far. This may occur with lifting, sports, or a fall.  RISK FACTORS Muscle strain is especially common in athletes.  SIGNS AND SYMPTOMS At the site of the muscle strain, there may be:  Pain.  Bruising.  Swelling.  Difficulty using the muscle due to pain or lack of normal function. DIAGNOSIS  Your health care provider will perform a physical exam and ask about your medical history. TREATMENT  Often, the best treatment for a muscle strain is resting, icing, and applying cold compresses to the injured area.  HOME CARE INSTRUCTIONS   Use the PRICE method of treatment to promote muscle healing during the first 2-3 days after your injury. The PRICE method involves:  Protecting the muscle from being injured again.  Restricting your activity and resting the injured body part.  Icing your injury. To do this, put ice in a plastic bag. Place a towel between your skin and the bag. Then, apply the ice and leave it on from 15-20 minutes each hour. After the third day, switch to moist heat packs.  Apply compression to the injured area with a splint or elastic bandage. Be careful not to wrap it too tightly. This may interfere with blood circulation or increase swelling.  Elevate the injured body part above the level of your heart as often as you can.  Only take over-the-counter or prescription medicines for pain,  discomfort, or fever as directed by your health care provider.  Warming up prior to exercise helps to prevent future muscle strains. SEEK MEDICAL CARE IF:   You have increasing pain or swelling in the injured area.  You have numbness, tingling, or a significant loss of strength in the injured area. MAKE SURE YOU:   Understand these instructions.  Will watch your condition.  Will get help right away if you are not doing well or get worse.   This information is not intended to replace advice given to you by your health care provider. Make sure you discuss any questions you have with your health care provider.   Document Released: 07/25/2005 Document Revised: 05/15/2013 Document Reviewed: 02/21/2013 Elsevier Interactive Patient Education [employer].

## 2016-02-25 NOTE — Telephone Encounter (Signed)
Received call from this patient demanding to talk to a provider concerning her pain. She was seen at the MAU 02/25/2016 and given a pain shot which she stated is not working. She was cussing and demanding to speak to a provider. I have advised patient to go back to the MAU or [facility] if she is in severe pain. Patient stated I do not have a ride and they are not going to do any thing. I attempted to calm patient down but patient was yelling and screaming. She then called me a BITCH and hung up the phone.

## 2016-02-26 ENCOUNTER — Telehealth: Payer: Self-pay | Admitting: Obstetrics and Gynecology

## 2016-02-26 NOTE — Progress Notes (Signed)
Patient called regarding results from her visit on 02/25/16, stating that she is very upset about the care she received in Maternity Admission, that she did get all her test results and that she doesn't think we providing her adequate care during her visit, noting that she had diarrhea and nothing was done to address this issue. She states she has called three times and no one has called her back. I reassured the patient that a provider would return her call asap. She then proceeded to state that she is planning to report [place] to [employer].

## 2016-02-26 NOTE — Telephone Encounter (Signed)
Pt called into MAU where she was seen yesterday. Pt was seen for abdominal pain and rare loose BM. She states since being seen loose Bm have increased significantly since being seen last. She reports she is now having 3-4 bowel movements a day. She continues denies fever and reports eating yogurt has helped with her abdominal pain. She is reading online and found that Escherichia coli is often found in [place] where she was recently vacationing and is worried she may have an infection. This was discussed with the amylase staff and it appears the best way to get a urine culture would be as an outpatient. Given that she is only having 3 bowel movements a day and has no fever I am less concerned for severe infectious etiology and more likely this is viral. I did send a message clinical staff the patient will be bringing in the stool sample on Monday morning. She is planning on picking up the specimen jar here in the MAU this weekend. No she is to return to the any if she develops fever or greater than 5 bowel movements per day. Patient voiced understanding with these instructions and was satisfied with our conversation.

## 2016-02-27 ENCOUNTER — Encounter (HOSPITAL_COMMUNITY): Payer: Self-pay

## 2016-02-27 ENCOUNTER — Inpatient Hospital Stay (HOSPITAL_COMMUNITY)
Admission: AD | Admit: 2016-02-27 | Discharge: 2016-02-27 | Disposition: A | Discharge status: Home / Self Care | Payer: Medicaid Other | Source: Ambulatory Visit | Attending: Obstetrics and Gynecology | Admitting: Obstetrics and Gynecology

## 2016-02-27 DIAGNOSIS — R197 Diarrhea, unspecified: Secondary | ICD-10-CM | POA: Diagnosis present

## 2016-02-27 DIAGNOSIS — A09 Infectious gastroenteritis and colitis, unspecified: Secondary | ICD-10-CM | POA: Diagnosis not present

## 2016-02-27 DIAGNOSIS — Z3483 Encounter for supervision of other normal pregnancy, third trimester: Secondary | ICD-10-CM

## 2016-02-27 DIAGNOSIS — Z3A31 31 weeks gestation of pregnancy: Secondary | ICD-10-CM | POA: Diagnosis not present

## 2016-02-27 DIAGNOSIS — Z3A32 32 weeks gestation of pregnancy: Secondary | ICD-10-CM

## 2016-02-27 DIAGNOSIS — O26893 Other specified pregnancy related conditions, third trimester: Secondary | ICD-10-CM | POA: Insufficient documentation

## 2016-02-27 DIAGNOSIS — O99613 Diseases of the digestive system complicating pregnancy, third trimester: Secondary | ICD-10-CM | POA: Diagnosis not present

## 2016-02-27 LAB — GASTROINTESTINAL PANEL BY PCR, STOOL (REPLACES STOOL CULTURE)
ASTROVIRUS: NOT DETECTED
Adenovirus F40/41: NOT DETECTED
CAMPYLOBACTER SPECIES: NOT DETECTED
Cryptosporidium: NOT DETECTED
Cyclospora cayetanensis: NOT DETECTED
E. COLI O157: NOT DETECTED
ENTEROAGGREGATIVE E COLI (EAEC): DETECTED — AB
ENTEROTOXIGENIC E COLI (ETEC): DETECTED — AB
Entamoeba histolytica: NOT DETECTED
Enteropathogenic E coli (EPEC): DETECTED — AB
GIARDIA LAMBLIA: NOT DETECTED
NOROVIRUS GI/GII: NOT DETECTED
PLESIMONAS SHIGELLOIDES: NOT DETECTED
Rotavirus A: NOT DETECTED
SALMONELLA SPECIES: NOT DETECTED
SAPOVIRUS (I, II, IV, AND V): NOT DETECTED
SHIGELLA/ENTEROINVASIVE E COLI (EIEC): NOT DETECTED
Shiga like toxin producing E coli (STEC): NOT DETECTED
Vibrio cholerae: NOT DETECTED
Vibrio species: NOT DETECTED
Yersinia enterocolitica: NOT DETECTED

## 2016-02-27 MED ORDER — AZITHROMYCIN 250 MG PO TABS
1000.0000 mg | ORAL_TABLET | Freq: Once | ORAL | Status: AC
  Administered 2016-02-27: 1000 mg via ORAL
  Filled 2016-02-27: qty 4

## 2016-02-27 MED ORDER — OXYCODONE-ACETAMINOPHEN 5-325 MG PO TABS: 1.0000 | ORAL_TABLET | ORAL | Status: DC | PRN

## 2016-02-27 MED ORDER — ACETAMINOPHEN 500 MG PO TABS
1000.0000 mg | ORAL_TABLET | Freq: Once | ORAL | Status: AC
  Administered 2016-02-27: 1000 mg via ORAL
  Filled 2016-02-27: qty 2

## 2016-02-27 NOTE — Progress Notes (Signed)
Notified of pt arrival in MAU, complaint and discomfort level. Will come see pt

## 2016-02-27 NOTE — MAU Note (Signed)
Lab called to report results. Pt + for enteroaggregative e.coli, enteropathogenic e.coli, and enterotoxigenic e.coli. Results called to Dr. Vanetta Shawl.

## 2016-02-27 NOTE — Progress Notes (Signed)
Called to notify of pt arrival in MAU. In delivery. Will call back

## 2016-02-27 NOTE — MAU Note (Signed)
Pt upset about fetal monitoring bands on her stomach. States it is making the pain worse and doesn't want them on. RN explained importance of monitoring and necessity to watch for contractions. Pt still upset.

## 2016-02-27 NOTE — MAU Provider Note (Signed)
MAU HISTORY AND PHYSICAL  Chief Complaint:  Diarrhea   Christy [name] is a 35 y.o.  [id number]  at [redacted]w[redacted]d presenting for Diarrhea . This began on Monday, when she was having 3 loose BMs per day (see MAU note on 7/20), but over the past 12 hours has had >10 stools. Has had associated abdominal cramping. Has tried home remedies like eating yogurt, which she felt helped. No other sick contacts, her husband who also went to [place] is asymptomatic. Patient states she has been having  none contractions, none vaginal bleeding, intact membranes, with active fetal movement.    Past Medical History  Diagnosis Date  . Anemia     Past Surgical History  Procedure Laterality Date  . No past surgeries      History reviewed. No pertinent family history.  Social History  Substance Use Topics  . Smoking status: Never Smoker   . Smokeless tobacco: Never Used  . Alcohol Use: No    Allergies  Allergen Reactions  . Diflucan [Fluconazole]     Mouth and vagina area breaks out into hives  . Sulfamethoxazole-Trimethoprim Hives  . Latex Rash  . Metronidazole Rash    If takes longer than 5 days will develop a rash    Prescriptions prior to admission  Medication Sig Dispense Refill Last Dose  . bismuth subsalicylate (PEPTO BISMOL) 262 MG/15ML suspension Take 30 mLs by mouth once.    02/25/2016 at Unknown time  . docusate sodium (COLACE) 100 MG capsule Take 1 capsule (100 mg total) by mouth 2 (two) times daily. (Patient not taking: Reported on 02/25/2016) 30 capsule 1 Taking  . hyoscyamine (LEVSIN SL) 0.125 MG SL tablet Place 1 tablet (0.125 mg total) under the tongue once. 30 tablet 0   . Prenatal Multivit-Min-Fe-FA (PRENATAL VITAMINS) 0.8 MG tablet Take 1 tablet by mouth daily. 30 tablet 12 Past Week at Unknown time    Review of Systems - Negative except for what is mentioned in HPI.  Physical Exam  Blood pressure 117/87, pulse 90, temperature 97.9 F (36.6 C), temperature source Oral, resp. rate 18,  last menstrual period 08/08/2015. GENERAL: Well-developed, well-nourished female in no acute distress.  LUNGS: Clear to auscultation bilaterally.  HEART: Regular rate and rhythm. ABDOMEN: Soft, nontender, nondistended, gravid.  EXTREMITIES: Nontender, no edema, 2+ distal pulses. FHT:  Cat 1  Contractions: irregular   Labs: No results found for this or any previous visit (from the past 24 hour(s)).  Imaging Studies:  US Abdomen Limited Ruq  02/25/2016  CLINICAL DATA:  Upper abdominal pain EXAM: US ABDOMEN LIMITED - RIGHT UPPER QUADRANT COMPARISON:  None. FINDINGS: Gallbladder: No gallstones or wall thickening visualized. There is no pericholecystic fluid. No sonographic Murphy sign noted by sonographer. Common bile duct: Diameter: 2 mm. There is no intrahepatic or extrahepatic biliary duct dilatation. Liver: No focal lesion identified. Within normal limits in parenchymal echogenicity. IMPRESSION: Study within normal limits. Electronically Signed   By: Lowella Grip III M.D.   On: 02/25/2016 14:17    Assessment: Christy [name] is  35 y.o. [id number] at [redacted]w[redacted]d presents with Diarrhea   Plan: Diarrhea, likely infectious -stool panel PCR -percocet for pain -azithromycin 1000mg  once -dispo home  Ralene Ok 7/22/20178:22 AM   OB FELLOW MAU DISCHARGE ATTESTATION  I have seen and examined this patient; I agree with above documentation in the resident's note. Patient was recently in [place] drinking unpurified water. This is likely traveller's diarrhea, treated in MAU with 1000mg  Azithromycin, single dose, as  recommended for treatment of traveller's diarrhea. Preterm labor precautions and kick count review given, recommended hydration as much as possible. Patient has an otherwise uncomplicated pregnancy. To be seen in office this week.   Elizabeth Mumaw, DO 9:24 AM  OB FELLOW MAU DISCHARGE ATTESTATION  I have seen and examined this patient; I agree with above documentation in the  resident's note.    Desma Maxim, MD 5:30 PM

## 2016-03-01 ENCOUNTER — Ambulatory Visit (INDEPENDENT_AMBULATORY_CARE_PROVIDER_SITE_OTHER): Payer: Medicaid Other | Admitting: Student

## 2016-03-01 VITALS — BP 132/68 | HR 94 | Wt 199.0 lb

## 2016-03-01 DIAGNOSIS — Z3483 Encounter for supervision of other normal pregnancy, third trimester: Secondary | ICD-10-CM

## 2016-03-01 LAB — POCT URINALYSIS DIP (DEVICE)
Glucose, UA: NEGATIVE mg/dL
HGB URINE DIPSTICK: NEGATIVE
KETONES UR: NEGATIVE mg/dL
LEUKOCYTES UA: NEGATIVE
NITRITE: NEGATIVE
PH: 6 (ref 5.0–8.0)
Protein, ur: 30 mg/dL — AB
Specific Gravity, Urine: >=1.03 (ref 1.005–1.030)
Urobilinogen, UA: 0.2 mg/dL (ref 0.0–1.0)

## 2016-03-01 NOTE — Progress Notes (Signed)
Subjective:  Christy [name] is a 36 y.o. [phone] at [redacted]w[redacted]d being seen today for ongoing prenatal care.  She is currently monitored for the following issues for this low-risk pregnancy and has ANEMIA, IRON DEFICIENCY; PALPITATIONS; MURMUR; Family history of coronary artery disease; Supervision of normal subsequent pregnancy; and Diarrhea of presumed infectious origin on her problem list.  Patient reports no complaints.   . Vag. Bleeding: None.  Movement: Present. Denies leaking of fluid. Pt previously seen in MAU & treated for traveller's diarrhea; symptoms markedly improved since abx treatment.   The following portions of the patient's history were reviewed and updated as appropriate: allergies, current medications, past family history, past medical history, past social history, past surgical history and problem list. Problem list updated.  Objective:   Vitals:   03/01/16 1054  BP: 132/68  Pulse: 94  Weight: 199 lb (90.3 kg)    Fetal Status: Fetal Heart Rate (bpm): 138 Fundal Height: 32 cm Movement: Present     General:  Alert, oriented and cooperative. Patient is in no acute distress.  Skin: Skin is warm and dry. No rash noted.   Cardiovascular: Normal heart rate noted  Respiratory: Normal respiratory effort, no problems with respiration noted  Abdomen: Soft, gravid, appropriate for gestational age. Pain/Pressure: Present     Pelvic:  Cervical exam deferred        Extremities: Normal range of motion.  Edema: None  Mental Status: Normal mood and affect. Normal behavior. Normal judgment and thought content.   Urinalysis:      Assessment and Plan:  Pregnancy: [id number] at [redacted]w[redacted]d  1. Encounter for supervision of other normal pregnancy in third trimester   Preterm labor symptoms and general obstetric precautions including but not limited to vaginal bleeding, contractions, leaking of fluid and fetal movement were reviewed in detail with the patient. Please refer to After Visit Summary for  other counseling recommendations.  Return in about 2 weeks (around 03/15/2016) for Routine OB.   Jorje Guild, NP

## 2016-03-01 NOTE — Patient Instructions (Signed)

## 2016-03-02 ENCOUNTER — Telehealth: Payer: Self-pay

## 2016-03-02 NOTE — Telephone Encounter (Signed)
Patient was seen in our office on 03/01/2016 with no  complaints of diarrhea since starting the antibiotic.

## 2016-03-15 ENCOUNTER — Ambulatory Visit (INDEPENDENT_AMBULATORY_CARE_PROVIDER_SITE_OTHER): Payer: Medicaid Other | Admitting: Medical

## 2016-03-15 VITALS — BP 130/81 | HR 92 | Wt 201.0 lb

## 2016-03-15 DIAGNOSIS — O368131 Decreased fetal movements, third trimester, fetus 1: Secondary | ICD-10-CM

## 2016-03-15 DIAGNOSIS — O1213 Gestational proteinuria, third trimester: Secondary | ICD-10-CM

## 2016-03-15 DIAGNOSIS — O368130 Decreased fetal movements, third trimester, not applicable or unspecified: Secondary | ICD-10-CM | POA: Diagnosis present

## 2016-03-15 LAB — POCT URINALYSIS DIP (DEVICE)
BILIRUBIN URINE: NEGATIVE
GLUCOSE, UA: NEGATIVE mg/dL
HGB URINE DIPSTICK: NEGATIVE
Ketones, ur: NEGATIVE mg/dL
NITRITE: NEGATIVE
Protein, ur: 30 mg/dL — AB
SPECIFIC GRAVITY, URINE: 1.025 (ref 1.005–1.030)
UROBILINOGEN UA: 0.2 mg/dL (ref 0.0–1.0)
pH: 6 (ref 5.0–8.0)

## 2016-03-15 LAB — COMPREHENSIVE METABOLIC PANEL
ALK PHOS: 178 U/L — AB (ref 33–115)
ALT: 15 U/L (ref 6–29)
AST: 15 U/L (ref 10–30)
Albumin: 3 g/dL — ABNORMAL LOW (ref 3.6–5.1)
BUN: 9 mg/dL (ref 7–25)
CALCIUM: 8.9 mg/dL (ref 8.6–10.2)
CO2: 18 mmol/L — AB (ref 20–31)
Chloride: 107 mmol/L (ref 98–110)
Creat: 0.61 mg/dL (ref 0.50–1.10)
GLUCOSE: 84 mg/dL (ref 65–99)
POTASSIUM: 4 mmol/L (ref 3.5–5.3)
SODIUM: 135 mmol/L (ref 135–146)
Total Bilirubin: 0.2 mg/dL (ref 0.2–1.2)
Total Protein: 6 g/dL — ABNORMAL LOW (ref 6.1–8.1)

## 2016-03-15 LAB — CBC
HCT: 31.9 % — ABNORMAL LOW (ref 35.0–45.0)
Hemoglobin: 10.7 g/dL — ABNORMAL LOW (ref 11.7–15.5)
MCH: 27.4 pg (ref 27.0–33.0)
MCHC: 33.5 g/dL (ref 32.0–36.0)
MCV: 81.8 fL (ref 80.0–100.0)
MPV: 9.4 fL (ref 7.5–12.5)
PLATELETS: 288 10*3/uL (ref 140–400)
RBC: 3.9 MIL/uL (ref 3.80–5.10)
RDW: 14.1 % (ref 11.0–15.0)
WBC: 5.3 10*3/uL (ref 3.8–10.8)

## 2016-03-15 NOTE — Progress Notes (Signed)
  Subjective:  Christy [name] is a 35 y.o. [phone] at 36w2dbeing seen today for ongoing prenatal care.  She is currently monitored for the following issues for this low-risk pregnancy and has ANEMIA, IRON DEFICIENCY; PALPITATIONS; MURMUR; Family history of coronary artery disease; Supervision of normal subsequent pregnancy; and Diarrhea of presumed infectious origin on her problem list.  Patient reports multiple complaints..  Contractions: Irritability. Vag. Bleeding: None.  Movement: Present. Denies leaking of fluid.   The following portions of the patient's history were reviewed and updated as appropriate: allergies, current medications, past family history, past medical history, past social history, past surgical history and problem list. Problem list updated.  Objective:   Vitals:   03/15/16 1011  BP: 130/81  Pulse: 92  Weight: 201 lb (91.2 kg)    Fetal Status: Fetal Heart Rate (bpm): NST Fundal Height: 33 cm Movement: Present     General:  Alert, oriented and cooperative. Patient is in no acute distress.  Skin: Skin is warm and dry. No rash noted.   Cardiovascular: Normal heart rate noted  Respiratory: Normal respiratory effort, no problems with respiration noted  Abdomen: Soft, gravid, appropriate for gestational age. Pain/Pressure: Present     Pelvic:  Cervical exam deferred        Extremities: Normal range of motion.  Edema: Trace  Mental Status: Normal mood and affect. Normal behavior. Normal judgment and thought content.   Urinalysis: Urine Protein: 1+ Urine Glucose: Negative  Assessment and Plan:  Pregnancy: [id number] 374w2d1. Decreased fetal movement, third trimester, fetus 1 - Fetal nonstress test - reactive today - Patient reassured of normal FM during today's visit, advised of possibility of changes in FM at this GA  2. Proteinuria affecting pregnancy in third trimester, antepartum - CBC - Comp Met (CMET) - Protein / Creatinine Ratio, Urine - Patient advised  of need for baseline labs given proteinuria, slight increase in BP from her normal, increased peripheral edema recently  3. Yeast vulvovaginitis - Pelvic exam deferred at patient's request - Plans to treat with OTC medication that she knows she is not allergic to (patient preference)   4. Round ligament pain - patient advised to use Tylenol PRN for pain - Discussed use of abdominal binder, moderation of activity and warm bath/shower for pain control  Preterm labor symptoms and general obstetric precautions including but not limited to vaginal bleeding, contractions, leaking of fluid and fetal movement were reviewed in detail with the patient. Please refer to After Visit Summary for other counseling recommendations.  Return in about 2 weeks (around 03/29/2016) for ROB/GBS.   JuLuvenia ReddenPA-C

## 2016-03-15 NOTE — Progress Notes (Signed)
Patient reports itching on external labia. Also reports white discharge Patient is requesting an ultrasound be performed to make sure the baby is okay & the cord isn't constricted. Patient states the baby has started to move way less since the weekend & the baby has started to hiccup more. She read that hiccups could be normal or could also be a sign that something is wrong with the baby. Patient states given her history and past issues she feels she needs an ultrasound to ensure the baby is still okay.

## 2016-03-15 NOTE — Patient Instructions (Signed)
Fetal Movement Counts  Patient Name: __________________________________________________ Patient Due Date: ____________________  Performing a fetal movement count is highly recommended in high-risk pregnancies, but it is good for every pregnant woman to do. Your health care provider may ask you to start counting fetal movements at 28 weeks of the pregnancy. Fetal movements often increase:  · After eating a full meal.  · After physical activity.  · After eating or drinking something sweet or cold.  · At rest.  Pay attention to when you feel the baby is most active. This will help you notice a pattern of your baby's sleep and wake cycles and what factors contribute to an increase in fetal movement. It is important to perform a fetal movement count at the same time each day when your baby is normally most active.   HOW TO COUNT FETAL MOVEMENTS  1. Find a quiet and comfortable area to sit or lie down on your left side. Lying on your left side provides the best blood and oxygen circulation to your baby.  2. Write down the day and time on a sheet of paper or in a journal.  3. Start counting kicks, flutters, swishes, rolls, or jabs in a 2-hour period. You should feel at least 10 movements within 2 hours.  4. If you do not feel 10 movements in 2 hours, wait 2-3 hours and count again. Look for a change in the pattern or not enough counts in 2 hours.  SEEK MEDICAL CARE IF:  · You feel less than 10 counts in 2 hours, tried twice.  · There is no movement in over an hour.  · The pattern is changing or taking longer each day to reach 10 counts in 2 hours.  · You feel the baby is not moving as he or she usually does.  Date: ____________ Movements: ____________ Start time: ____________ Finish time: ____________   Date: ____________ Movements: ____________ Start time: ____________ Finish time: ____________  Date: ____________ Movements: ____________ Start time: ____________ Finish time: ____________  Date: ____________ Movements:  ____________ Start time: ____________ Finish time: ____________  Date: ____________ Movements: ____________ Start time: ____________ Finish time: ____________  Date: ____________ Movements: ____________ Start time: ____________ Finish time: ____________  Date: ____________ Movements: ____________ Start time: ____________ Finish time: ____________  Date: ____________ Movements: ____________ Start time: ____________ Finish time: ____________   Date: ____________ Movements: ____________ Start time: ____________ Finish time: ____________  Date: ____________ Movements: ____________ Start time: ____________ Finish time: ____________  Date: ____________ Movements: ____________ Start time: ____________ Finish time: ____________  Date: ____________ Movements: ____________ Start time: ____________ Finish time: ____________  Date: ____________ Movements: ____________ Start time: ____________ Finish time: ____________  Date: ____________ Movements: ____________ Start time: ____________ Finish time: ____________  Date: ____________ Movements: ____________ Start time: ____________ Finish time: ____________   Date: ____________ Movements: ____________ Start time: ____________ Finish time: ____________  Date: ____________ Movements: ____________ Start time: ____________ Finish time: ____________  Date: ____________ Movements: ____________ Start time: ____________ Finish time: ____________  Date: ____________ Movements: ____________ Start time: ____________ Finish time: ____________  Date: ____________ Movements: ____________ Start time: ____________ Finish time: ____________  Date: ____________ Movements: ____________ Start time: ____________ Finish time: ____________  Date: ____________ Movements: ____________ Start time: ____________ Finish time: ____________   Date: ____________ Movements: ____________ Start time: ____________ Finish time: ____________  Date: ____________ Movements: ____________ Start time: ____________ Finish  time: ____________  Date: ____________ Movements: ____________ Start time: ____________ Finish time: ____________  Date: ____________ Movements: ____________ Start time:   ____________ Finish time: ____________  Date: ____________ Movements: ____________ Start time: ____________ Finish time: ____________  Date: ____________ Movements: ____________ Start time: ____________ Finish time: ____________  Date: ____________ Movements: ____________ Start time: ____________ Finish time: ____________   Date: ____________ Movements: ____________ Start time: ____________ Finish time: ____________  Date: ____________ Movements: ____________ Start time: ____________ Finish time: ____________  Date: ____________ Movements: ____________ Start time: ____________ Finish time: ____________  Date: ____________ Movements: ____________ Start time: ____________ Finish time: ____________  Date: ____________ Movements: ____________ Start time: ____________ Finish time: ____________  Date: ____________ Movements: ____________ Start time: ____________ Finish time: ____________  Date: ____________ Movements: ____________ Start time: ____________ Finish time: ____________   Date: ____________ Movements: ____________ Start time: ____________ Finish time: ____________  Date: ____________ Movements: ____________ Start time: ____________ Finish time: ____________  Date: ____________ Movements: ____________ Start time: ____________ Finish time: ____________  Date: ____________ Movements: ____________ Start time: ____________ Finish time: ____________  Date: ____________ Movements: ____________ Start time: ____________ Finish time: ____________  Date: ____________ Movements: ____________ Start time: ____________ Finish time: ____________  Date: ____________ Movements: ____________ Start time: ____________ Finish time: ____________   Date: ____________ Movements: ____________ Start time: ____________ Finish time: ____________  Date: ____________  Movements: ____________ Start time: ____________ Finish time: ____________  Date: ____________ Movements: ____________ Start time: ____________ Finish time: ____________  Date: ____________ Movements: ____________ Start time: ____________ Finish time: ____________  Date: ____________ Movements: ____________ Start time: ____________ Finish time: ____________  Date: ____________ Movements: ____________ Start time: ____________ Finish time: ____________  Date: ____________ Movements: ____________ Start time: ____________ Finish time: ____________   Date: ____________ Movements: ____________ Start time: ____________ Finish time: ____________  Date: ____________ Movements: ____________ Start time: ____________ Finish time: ____________  Date: ____________ Movements: ____________ Start time: ____________ Finish time: ____________  Date: ____________ Movements: ____________ Start time: ____________ Finish time: ____________  Date: ____________ Movements: ____________ Start time: ____________ Finish time: ____________  Date: ____________ Movements: ____________ Start time: ____________ Finish time: ____________     This information is not intended to replace advice given to you by your health care provider. Make sure you discuss any questions you have with your health care provider.     Document Released: 08/24/2006 Document Revised: 08/15/2014 Document Reviewed: 05/21/2012  Elsevier Interactive Patient Education ©2016 [employer]

## 2016-03-16 LAB — PROTEIN / CREATININE RATIO, URINE
Creatinine, Urine: 233 mg/dL (ref 20–320)
PROTEIN CREATININE RATIO: 193 mg/g{creat} — AB (ref 21–161)
Total Protein, Urine: 45 mg/dL — ABNORMAL HIGH (ref 5–24)

## 2016-03-22 ENCOUNTER — Telehealth: Payer: Self-pay | Admitting: *Deleted

## 2016-03-22 ENCOUNTER — Ambulatory Visit (INDEPENDENT_AMBULATORY_CARE_PROVIDER_SITE_OTHER): Payer: Medicaid Other | Admitting: Family

## 2016-03-22 VITALS — BP 126/76 | HR 77 | Wt 211.2 lb

## 2016-03-22 DIAGNOSIS — Z3483 Encounter for supervision of other normal pregnancy, third trimester: Secondary | ICD-10-CM | POA: Diagnosis not present

## 2016-03-22 DIAGNOSIS — R42 Dizziness and giddiness: Secondary | ICD-10-CM

## 2016-03-22 DIAGNOSIS — D509 Iron deficiency anemia, unspecified: Secondary | ICD-10-CM | POA: Diagnosis not present

## 2016-03-22 DIAGNOSIS — IMO0002 Reserved for concepts with insufficient information to code with codable children: Secondary | ICD-10-CM

## 2016-03-22 LAB — COMPREHENSIVE METABOLIC PANEL
ALK PHOS: 170 U/L — AB (ref 33–115)
ALT: 13 U/L (ref 6–29)
AST: 17 U/L (ref 10–30)
Albumin: 2.9 g/dL — ABNORMAL LOW (ref 3.6–5.1)
BUN: 7 mg/dL (ref 7–25)
CO2: 18 mmol/L — ABNORMAL LOW (ref 20–31)
CREATININE: 0.69 mg/dL (ref 0.50–1.10)
Calcium: 8.3 mg/dL — ABNORMAL LOW (ref 8.6–10.2)
Chloride: 108 mmol/L (ref 98–110)
Glucose, Bld: 88 mg/dL (ref 65–99)
Potassium: 3.5 mmol/L (ref 3.5–5.3)
SODIUM: 136 mmol/L (ref 135–146)
TOTAL PROTEIN: 5.5 g/dL — AB (ref 6.1–8.1)
Total Bilirubin: 0.2 mg/dL (ref 0.2–1.2)

## 2016-03-22 LAB — POCT URINALYSIS DIP (DEVICE)
GLUCOSE, UA: NEGATIVE mg/dL
GLUCOSE, UA: NEGATIVE mg/dL
Hgb urine dipstick: NEGATIVE
Hgb urine dipstick: NEGATIVE
Ketones, ur: NEGATIVE mg/dL
Ketones, ur: NEGATIVE mg/dL
LEUKOCYTES UA: NEGATIVE
Leukocytes, UA: NEGATIVE
NITRITE: NEGATIVE
Nitrite: NEGATIVE
PROTEIN: 30 mg/dL — AB
Protein, ur: 30 mg/dL — AB
SPECIFIC GRAVITY, URINE: 1.025 (ref 1.005–1.030)
Specific Gravity, Urine: 1.025 (ref 1.005–1.030)
UROBILINOGEN UA: 0.2 mg/dL (ref 0.0–1.0)
UROBILINOGEN UA: 0.2 mg/dL (ref 0.0–1.0)
pH: 6 (ref 5.0–8.0)
pH: 6 (ref 5.0–8.0)

## 2016-03-22 LAB — CBC
HCT: 28.9 % — ABNORMAL LOW (ref 35.0–45.0)
Hemoglobin: 9.7 g/dL — ABNORMAL LOW (ref 11.7–15.5)
MCH: 27.1 pg (ref 27.0–33.0)
MCHC: 33.6 g/dL (ref 32.0–36.0)
MCV: 80.7 fL (ref 80.0–100.0)
MPV: 9.4 fL (ref 7.5–12.5)
PLATELETS: 246 10*3/uL (ref 140–400)
RBC: 3.58 MIL/uL — AB (ref 3.80–5.10)
RDW: 14.3 % (ref 11.0–15.0)
WBC: 5.7 10*3/uL (ref 3.8–10.8)

## 2016-03-22 NOTE — Progress Notes (Signed)
Subjective:  Christy [name] is a 35 y.o. G8P6016 at [redacted]w[redacted]d being seen today for ongoing prenatal care.  She is currently monitored for the following issues for this low-risk pregnancy and has ANEMIA, IRON DEFICIENCY; PALPITATIONS; MURMUR; Family history of coronary artery disease; Supervision of normal subsequent pregnancy; and Diarrhea of presumed infectious origin on her problem list.  Patient  reports feeling shortness of breath and increased pedal and hand swelling yesterday.  Swelling has improved this morning.  Denies headache, vision changes, or epigastric pain. Feeling anxious more than ususal. Vag. Bleeding: None.  Movement: Present. Denies leaking of fluid.   The following portions of the patient's history were reviewed and updated as appropriate: allergies, current medications, past family history, past medical history, past social history, past surgical history and problem list. Problem list updated.  Objective:   Vitals:   03/22/16 1030  BP: 126/76  Pulse: 77  Weight: 211 lb 3.2 oz (95.8 kg)    Fetal Status: Fetal Heart Rate (bpm): 172 Fundal Height: 35 cm Movement: Present     General:  Alert, oriented and cooperative. Patient is in no acute distress.  Skin: Skin is warm and dry. No rash noted.   Cardiovascular: Normal heart rate noted  Respiratory: Normal respiratory effort, no problems with respiration noted  Abdomen: Soft, gravid, appropriate for gestational age. Pain/Pressure: Present     Pelvic:  Cervical exam deferred        Extremities: Normal range of motion.  Edema: Trace  Mental Status: Normal mood and affect. Normal behavior. Normal judgment and thought content.   Urinalysis: Urine Protein: 1+ Urine Glucose: Negative  Assessment and Plan:  Pregnancy: G8P6016 at [redacted]w[redacted]d  1. Dizziness - CBC - Comp Met (CMET)  2. Encounter for supervision of other normal pregnancy in third trimester - NST due to FHR 172 > baseline 130's, +accels  3. ANEMIA, IRON  DEFICIENCY - Continue ferrous sulfate  Term labor symptoms and general obstetric precautions including but not limited to vaginal bleeding, contractions, leaking of fluid and fetal movement were reviewed in detail with the patient. Please refer to After Visit Summary for other counseling recommendations.  Return in about 1 week (around 03/29/2016).   Walidah N Karim, CNM 

## 2016-03-22 NOTE — Telephone Encounter (Signed)
Patient called this morning.  Call was transferred to me because patient was very upset that no one had returned her call.    Patient states she is experiencing increased swelling in her hands and feet.  States she is unable to sleep because she feels short of breath.  States she had a headache yesterday that was relieved by tylenol.  States she does have blurry vision and some epigastric pain and tenderness.  I have explained to patient we would like to see her today at 10 am to be sure there is nothing serious going on.  I explained that sometimes as the baby is getting bigger that they push up on the diagphram making you feel short of breath and that may be normal.  I told her I am concerned about her other symptoms.  I reviewed her chart and looked at her previous blood pressures.  Her last one was the highest at 130's/80's.  She also had some protein in her urine at her last visit.  I explained I felt she needed to be seen.  Patient is in agreement and states she will come in at 10 am today.    Patient states she is very disappointed and upset with our office.  States nurses never return calls and she has difficulty getting appointments.  I explained to patient, while this is not an excuse, several of the private practices in the community are no longer accepting medicaid patients.  I explained we have experienced an unexpected increase in volume as a result.  I explained to the patient we are diligently working on a plan to make this better as we understand this is causing problems for our patients.  Patient stated understanding.  Said she was unaware.  I thanked patient for allowing me to explain the issue and for being patient with [place] as we work to make this situation better.  Patient states understanding.  I also advised patient of next scheduled appointment on 03/29/16.

## 2016-03-23 ENCOUNTER — Inpatient Hospital Stay (HOSPITAL_COMMUNITY)
Admission: AD | Admit: 2016-03-23 | Discharge: 2016-03-23 | Disposition: A | Discharge status: Home / Self Care | Payer: Medicaid Other | Source: Ambulatory Visit | Attending: Obstetrics & Gynecology | Admitting: Obstetrics & Gynecology

## 2016-03-23 ENCOUNTER — Encounter (HOSPITAL_COMMUNITY): Payer: Self-pay

## 2016-03-23 DIAGNOSIS — R519 Headache, unspecified: Secondary | ICD-10-CM

## 2016-03-23 DIAGNOSIS — Z3A35 35 weeks gestation of pregnancy: Secondary | ICD-10-CM | POA: Insufficient documentation

## 2016-03-23 DIAGNOSIS — O26893 Other specified pregnancy related conditions, third trimester: Secondary | ICD-10-CM | POA: Diagnosis not present

## 2016-03-23 DIAGNOSIS — R0989 Other specified symptoms and signs involving the circulatory and respiratory systems: Secondary | ICD-10-CM | POA: Insufficient documentation

## 2016-03-23 DIAGNOSIS — R51 Headache: Secondary | ICD-10-CM

## 2016-03-23 LAB — URINE MICROSCOPIC-ADD ON

## 2016-03-23 LAB — PROTEIN / CREATININE RATIO, URINE
Creatinine, Urine: 325 mg/dL
PROTEIN CREATININE RATIO: 0.14 mg/mg{creat} (ref 0.00–0.15)
Total Protein, Urine: 47 mg/dL

## 2016-03-23 LAB — URINALYSIS, ROUTINE W REFLEX MICROSCOPIC
Bilirubin Urine: NEGATIVE
GLUCOSE, UA: NEGATIVE mg/dL
KETONES UR: NEGATIVE mg/dL
LEUKOCYTES UA: NEGATIVE
Nitrite: NEGATIVE
PROTEIN: 30 mg/dL — AB
Specific Gravity, Urine: 1.02 (ref 1.005–1.030)
pH: 7 (ref 5.0–8.0)

## 2016-03-23 MED ORDER — CYCLOBENZAPRINE HCL 10 MG PO TABS
10.0000 mg | ORAL_TABLET | Freq: Once | ORAL | Status: AC
  Administered 2016-03-23: 10 mg via ORAL
  Filled 2016-03-23: qty 1

## 2016-03-23 MED ORDER — CYCLOBENZAPRINE HCL 10 MG PO TABS: 10.0000 mg | ORAL_TABLET | Freq: Two times a day (BID) | ORAL | 0 refills | Status: DC | PRN

## 2016-03-23 NOTE — Discharge Instructions (Signed)

## 2016-03-23 NOTE — MAU Note (Signed)
Urine sent to lab 

## 2016-03-23 NOTE — MAU Provider Note (Signed)
  History     CSN: [id number]  Arrival date and time: 03/23/16 2042   None     Chief Complaint  Patient presents with  . Headache  . Neck Pain  . Chest Pain   HPI Christy [name] is a 35yo [id number] @ 35.3wks who presents for eval of onset upper neck discomfort/H/A onset today, as well as LE swelling onset after her OB visit yesterday. Also reports intermittent SOB and CP.  Denies visual disturbances, RUQ pain, reg ctx, leaking or bldg. She had nl plts and LFTs yesterday in the office when she was seen for edema.  OB History    Gravida Para Term Preterm AB Living   8 6 6  0 1 6   SAB TAB Ectopic Multiple Live Births   1 0 0 0 6      Past Medical History:  Diagnosis Date  . Anemia     Past Surgical History:  Procedure Laterality Date  . IUD REMOVAL    . NO PAST SURGERIES      No family history on file.  Social History  Substance Use Topics  . Smoking status: Never Smoker  . Smokeless tobacco: Never Used  . Alcohol use No    Allergies:  Allergies  Allergen Reactions  . Diflucan [Fluconazole]     Mouth and vagina area breaks out into hives  . Sulfamethoxazole-Trimethoprim Hives  . Latex Rash  . Metronidazole Rash    If takes longer than 5 days will develop a rash    Prescriptions Prior to Admission  Medication Sig Dispense Refill Last Dose  . Prenatal Multivit-Min-Fe-FA (PRENATAL VITAMINS) 0.8 MG tablet Take 1 tablet by mouth daily. (Patient taking differently: Take 1 tablet by mouth daily. ) 30 tablet 12 Past Week at Unknown time    ROS No other pertinents other than what is listed in HPI Physical Exam  BPs 144/83, 137/83, 136/89, 136/78, 128/72 Blood pressure 140/85, pulse 66, temperature 98.2 F (36.8 C), temperature source Oral, resp. rate 20, height 5\' 5"  (1.651 m), weight 96.2 kg (212 lb), last menstrual period 08/08/2015, SpO2 97 %.  Physical Exam  Constitutional: She is oriented to person, place, and time. She appears well-developed.  HENT:  Head:  Normocephalic.  Neck: Normal range of motion. Neck supple.  Cardiovascular: Normal rate.   Respiratory: Effort normal and breath sounds normal.  GI:  EFM 145-150s, +accels, no decels Rare mild ctx  Musculoskeletal: Normal range of motion.  BLE 2+edema  Neurological: She is alert and oriented to person, place, and time. She has normal reflexes.  Skin: Skin is warm and dry.  Psychiatric: She has a normal mood and affect. Her behavior is normal. Thought content normal.   Urine P/C ratio: 0.14  Nl EKG  MAU Course  Procedures  MDM NST read UA and urine P/C ratio Given Flexeril in MAU- feels better and is requesting d/c  Assessment and Plan  IUP@35 .3 H/A Swelling of preg Labile BP  D/C home w/ rx Flexeril for neck/head pain Msg sent to office for BP check on Friday Given pre-e precautions at discharge  [place], Diller 03/23/2016, 10:58 PM

## 2016-03-23 NOTE — MAU Note (Signed)
Pt reports headache that will not go away today, light-headed, swelling in her hands and feet. Also reports pain in her upper chest and shortness of breath. States she is having a numb feeling in the back of her neck.

## 2016-03-25 ENCOUNTER — Other Ambulatory Visit (HOSPITAL_COMMUNITY)
Admission: RE | Admit: 2016-03-25 | Discharge: 2016-03-25 | Disposition: A | Discharge status: Home / Self Care | Payer: Medicaid Other | Source: Ambulatory Visit | Attending: Family Medicine | Admitting: Family Medicine

## 2016-03-25 ENCOUNTER — Ambulatory Visit (INDEPENDENT_AMBULATORY_CARE_PROVIDER_SITE_OTHER): Payer: Medicaid Other | Admitting: Family Medicine

## 2016-03-25 VITALS — BP 152/88

## 2016-03-25 VITALS — BP 131/80 | HR 79

## 2016-03-25 DIAGNOSIS — Z3483 Encounter for supervision of other normal pregnancy, third trimester: Secondary | ICD-10-CM

## 2016-03-25 DIAGNOSIS — Z113 Encounter for screening for infections with a predominantly sexual mode of transmission: Secondary | ICD-10-CM | POA: Diagnosis not present

## 2016-03-25 DIAGNOSIS — O133 Gestational [pregnancy-induced] hypertension without significant proteinuria, third trimester: Secondary | ICD-10-CM

## 2016-03-25 LAB — CBC
HCT: 31.6 % — ABNORMAL LOW (ref 35.0–45.0)
Hemoglobin: 10.6 g/dL — ABNORMAL LOW (ref 11.7–15.5)
MCH: 27.3 pg (ref 27.0–33.0)
MCHC: 33.5 g/dL (ref 32.0–36.0)
MCV: 81.4 fL (ref 80.0–100.0)
MPV: 9.5 fL (ref 7.5–12.5)
PLATELETS: 261 10*3/uL (ref 140–400)
RBC: 3.88 MIL/uL (ref 3.80–5.10)
RDW: 14.3 % (ref 11.0–15.0)
WBC: 5.9 10*3/uL (ref 3.8–10.8)

## 2016-03-25 LAB — POCT URINALYSIS DIP (DEVICE)
Bilirubin Urine: NEGATIVE
GLUCOSE, UA: NEGATIVE mg/dL
KETONES UR: NEGATIVE mg/dL
Leukocytes, UA: NEGATIVE
Nitrite: NEGATIVE
PROTEIN: 30 mg/dL — AB
Urobilinogen, UA: 0.2 mg/dL (ref 0.0–1.0)
pH: 6.5 (ref 5.0–8.0)

## 2016-03-25 LAB — COMPREHENSIVE METABOLIC PANEL
ALT: 14 U/L (ref 6–29)
AST: 18 U/L (ref 10–30)
Albumin: 3 g/dL — ABNORMAL LOW (ref 3.6–5.1)
Alkaline Phosphatase: 201 U/L — ABNORMAL HIGH (ref 33–115)
BILIRUBIN TOTAL: 0.2 mg/dL (ref 0.2–1.2)
BUN: 6 mg/dL — AB (ref 7–25)
CO2: 21 mmol/L (ref 20–31)
CREATININE: 0.66 mg/dL (ref 0.50–1.10)
Calcium: 8.4 mg/dL — ABNORMAL LOW (ref 8.6–10.2)
Chloride: 107 mmol/L (ref 98–110)
GLUCOSE: 96 mg/dL (ref 65–99)
Potassium: 3.6 mmol/L (ref 3.5–5.3)
SODIUM: 139 mmol/L (ref 135–146)
Total Protein: 6 g/dL — ABNORMAL LOW (ref 6.1–8.1)

## 2016-03-25 LAB — OB RESULTS CONSOLE GBS: GBS: NEGATIVE

## 2016-03-25 LAB — OB RESULTS CONSOLE GC/CHLAMYDIA: GC PROBE AMP, GENITAL: NEGATIVE

## 2016-03-25 NOTE — Progress Notes (Signed)
Subjective:  Christy [name] is a 35 y.o. [phone] at [redacted]w[redacted]d being seen today for ongoing prenatal care.  She is currently monitored for the following issues for this low-risk pregnancy and has ANEMIA, IRON DEFICIENCY; PALPITATIONS; MURMUR; Family history of coronary artery disease; Supervision of normal subsequent pregnancy; and Diarrhea of presumed infectious origin on her problem list.  Patient reports leaking fluid yesterday - 2 small gushes when sneezed.  Didn't smell like urine.  Having a few contractions.  Denies headache, vision changes, RUQ pain..   .  .   . Denies leaking of fluid.   The following portions of the patient's history were reviewed and updated as appropriate: allergies, current medications, past family history, past medical history, past social history, past surgical history and problem list. Problem list updated.  Objective:      Vitals:   03/25/16 0952  BP: (!) 152/88    Fetal Status:           General:  Alert, oriented and cooperative. Patient is in no acute distress.  Skin: Skin is warm and dry. No rash noted.   Cardiovascular: Normal heart rate noted  Respiratory: Normal respiratory effort, no problems with respiration noted  Abdomen: Soft, gravid, appropriate for gestational age.       Pelvic:  Cervical exam performed 0/thick/soft/blot        Extremities: Normal range of motion.     Mental Status: Normal mood and affect. Normal behavior. Normal judgment and thought content.   Urinalysis:      Assessment and Plan:  Pregnancy: [id number] at [redacted]w[redacted]d  1. Encounter for supervision of other normal pregnancy in third trimester 36 week cultures done.  Neg fern. - GC/Chlamydia probe amp ([place])not at [facility] - Culture, beta strep (group b only)  2. Gestational hypertension, third trimester NST reactive.  Pt declines to go to MAU for prolonged BP monitoring.  Will repeat labs today.  Discussed preeclampsia si/sxs. Start twice weekly testing today. -  Protein / creatinine ratio, urine - Comprehensive metabolic panel - CBC  Preterm labor symptoms and general obstetric precautions including but not limited to vaginal bleeding, contractions, leaking of fluid and fetal movement were reviewed in detail with the patient. Please refer to After Visit Summary for other counseling recommendations.  No Follow-up on file.   Truett Mainland, DO

## 2016-03-25 NOTE — Progress Notes (Signed)
Here for bp check and start nst's today.

## 2016-03-25 NOTE — Progress Notes (Signed)
Pt here today for BP check.  Manual BP RA 152/88.  Pt c/o having contractions q 10 min apart since this am, that she has had gushing of fluid x 2, sharp pain under left breast and also SOB.  Dr. Nehemiah Settle notified.

## 2016-03-25 NOTE — Progress Notes (Signed)
Subjective:  Christy [name] is a 35 y.o. [phone] at [redacted]w[redacted]d being seen today for ongoing prenatal care.  She is currently monitored for the following issues for this low-risk pregnancy and has ANEMIA, IRON DEFICIENCY; PALPITATIONS; MURMUR; Family history of coronary artery disease; Supervision of normal subsequent pregnancy; and Diarrhea of presumed infectious origin on her problem list.  Patient reports leaking fluid yesterday - 2 small gushes when sneezed.  Didn't smell like urine.  Having a few contractions.  Denies headache, vision changes, RUQ pain..   .  .   . Denies leaking of fluid.   The following portions of the patient's history were reviewed and updated as appropriate: allergies, current medications, past family history, past medical history, past social history, past surgical history and problem list. Problem list updated.  Objective:   Vitals:   03/25/16 0952  BP: (!) 152/88    Fetal Status:           General:  Alert, oriented and cooperative. Patient is in no acute distress.  Skin: Skin is warm and dry. No rash noted.   Cardiovascular: Normal heart rate noted  Respiratory: Normal respiratory effort, no problems with respiration noted  Abdomen: Soft, gravid, appropriate for gestational age.       Pelvic:  Cervical exam performed 0/thick/soft/blot        Extremities: Normal range of motion.     Mental Status: Normal mood and affect. Normal behavior. Normal judgment and thought content.   Urinalysis:      Assessment and Plan:  Pregnancy: [id number] at [redacted]w[redacted]d  1. Encounter for supervision of other normal pregnancy in third trimester 36 week cultures done.  Neg fern. - GC/Chlamydia probe amp ([place])not at [facility] - Culture, beta strep (group b only)  2. Gestational hypertension, third trimester NST reactive.  Pt declines to go to MAU for prolonged BP monitoring.  Will repeat labs today.  Discussed preeclampsia si/sxs. Start twice weekly testing today. - Protein /  creatinine ratio, urine - Comprehensive metabolic panel - CBC  Preterm labor symptoms and general obstetric precautions including but not limited to vaginal bleeding, contractions, leaking of fluid and fetal movement were reviewed in detail with the patient. Please refer to After Visit Summary for other counseling recommendations.  No Follow-up on file.   Truett Mainland, DO

## 2016-03-26 LAB — PROTEIN / CREATININE RATIO, URINE
CREATININE, URINE: 286 mg/dL (ref 20–320)
PROTEIN CREATININE RATIO: 227 mg/g{creat} — AB (ref 21–161)
Total Protein, Urine: 65 mg/dL — ABNORMAL HIGH (ref 5–24)

## 2016-03-27 ENCOUNTER — Encounter (HOSPITAL_COMMUNITY): Payer: Self-pay | Admitting: *Deleted

## 2016-03-27 ENCOUNTER — Inpatient Hospital Stay (HOSPITAL_COMMUNITY)
Admission: AD | Admit: 2016-03-27 | Discharge: 2016-03-28 | DRG: 781 | Disposition: A | Discharge status: Home / Self Care | Payer: Medicaid Other | Source: Ambulatory Visit | Attending: Obstetrics and Gynecology | Admitting: Obstetrics and Gynecology

## 2016-03-27 ENCOUNTER — Encounter (HOSPITAL_COMMUNITY): Payer: Self-pay | Admitting: Anesthesiology

## 2016-03-27 DIAGNOSIS — Z3483 Encounter for supervision of other normal pregnancy, third trimester: Secondary | ICD-10-CM

## 2016-03-27 DIAGNOSIS — Z3A36 36 weeks gestation of pregnancy: Secondary | ICD-10-CM | POA: Diagnosis not present

## 2016-03-27 DIAGNOSIS — R51 Headache: Secondary | ICD-10-CM | POA: Diagnosis not present

## 2016-03-27 DIAGNOSIS — Z8759 Personal history of other complications of pregnancy, childbirth and the puerperium: Secondary | ICD-10-CM | POA: Diagnosis present

## 2016-03-27 DIAGNOSIS — O133 Gestational [pregnancy-induced] hypertension without significant proteinuria, third trimester: Secondary | ICD-10-CM

## 2016-03-27 DIAGNOSIS — O1403 Mild to moderate pre-eclampsia, third trimester: Secondary | ICD-10-CM | POA: Diagnosis not present

## 2016-03-27 LAB — CBC WITH DIFFERENTIAL/PLATELET
Basophils Absolute: 0 10*3/uL (ref 0.0–0.1)
Basophils Relative: 0 %
EOS PCT: 2 %
Eosinophils Absolute: 0.1 10*3/uL (ref 0.0–0.7)
HEMATOCRIT: 29.8 % — AB (ref 36.0–46.0)
Hemoglobin: 10 g/dL — ABNORMAL LOW (ref 12.0–15.0)
LYMPHS ABS: 1.2 10*3/uL (ref 0.7–4.0)
LYMPHS PCT: 21 %
MCH: 27.4 pg (ref 26.0–34.0)
MCHC: 33.6 g/dL (ref 30.0–36.0)
MCV: 81.6 fL (ref 78.0–100.0)
MONO ABS: 0.5 10*3/uL (ref 0.1–1.0)
Monocytes Relative: 9 %
Neutro Abs: 3.8 10*3/uL (ref 1.7–7.7)
Neutrophils Relative %: 68 %
PLATELETS: 221 10*3/uL (ref 150–400)
RBC: 3.65 MIL/uL — AB (ref 3.87–5.11)
RDW: 14.4 % (ref 11.5–15.5)
WBC: 5.6 10*3/uL (ref 4.0–10.5)

## 2016-03-27 LAB — COMPREHENSIVE METABOLIC PANEL
ALK PHOS: 173 U/L — AB (ref 38–126)
ALT: 18 U/L (ref 14–54)
AST: 23 U/L (ref 15–41)
Albumin: 2.8 g/dL — ABNORMAL LOW (ref 3.5–5.0)
Anion gap: 7 (ref 5–15)
BILIRUBIN TOTAL: 0.7 mg/dL (ref 0.3–1.2)
BUN: 8 mg/dL (ref 6–20)
CALCIUM: 8.4 mg/dL — AB (ref 8.9–10.3)
CHLORIDE: 108 mmol/L (ref 101–111)
CO2: 19 mmol/L — ABNORMAL LOW (ref 22–32)
CREATININE: 0.61 mg/dL (ref 0.44–1.00)
Glucose, Bld: 74 mg/dL (ref 65–99)
Potassium: 3.5 mmol/L (ref 3.5–5.1)
Sodium: 134 mmol/L — ABNORMAL LOW (ref 135–145)
TOTAL PROTEIN: 6.5 g/dL (ref 6.5–8.1)

## 2016-03-27 LAB — URINE MICROSCOPIC-ADD ON

## 2016-03-27 LAB — TYPE AND SCREEN
ABO/RH(D): B POS
Antibody Screen: NEGATIVE

## 2016-03-27 LAB — CULTURE, BETA STREP (GROUP B ONLY)

## 2016-03-27 LAB — URINALYSIS, ROUTINE W REFLEX MICROSCOPIC
Bilirubin Urine: NEGATIVE
Glucose, UA: NEGATIVE mg/dL
Ketones, ur: NEGATIVE mg/dL
LEUKOCYTES UA: NEGATIVE
NITRITE: NEGATIVE
PROTEIN: 100 mg/dL — AB
Specific Gravity, Urine: 1.02 (ref 1.005–1.030)
pH: 6 (ref 5.0–8.0)

## 2016-03-27 LAB — PROTEIN / CREATININE RATIO, URINE
CREATININE, URINE: 139 mg/dL
Protein Creatinine Ratio: 0.56 mg/mg{Cre} — ABNORMAL HIGH (ref 0.00–0.15)
TOTAL PROTEIN, URINE: 78 mg/dL

## 2016-03-27 MED ORDER — CALCIUM CARBONATE ANTACID 500 MG PO CHEW
2.0000 | CHEWABLE_TABLET | ORAL | Status: DC | PRN
  Administered 2016-03-28: 400 mg via ORAL
  Filled 2016-03-27: qty 2

## 2016-03-27 MED ORDER — BETAMETHASONE SOD PHOS & ACET 6 (3-3) MG/ML IJ SUSP
12.0000 mg | Freq: Once | INTRAMUSCULAR | Status: AC
  Administered 2016-03-27: 12 mg via INTRAMUSCULAR
  Filled 2016-03-27: qty 2

## 2016-03-27 MED ORDER — ACETAMINOPHEN 500 MG PO TABS
1000.0000 mg | ORAL_TABLET | Freq: Once | ORAL | Status: AC
  Administered 2016-03-27: 1000 mg via ORAL
  Filled 2016-03-27: qty 2

## 2016-03-27 MED ORDER — BUTALBITAL-APAP-CAFFEINE 50-325-40 MG PO TABS
2.0000 | ORAL_TABLET | ORAL | Status: DC | PRN
  Administered 2016-03-27: 2 via ORAL
  Filled 2016-03-27: qty 2

## 2016-03-27 MED ORDER — PRENATAL MULTIVITAMIN CH
1.0000 | ORAL_TABLET | Freq: Every day | ORAL | Status: DC
  Administered 2016-03-28: 1 via ORAL
  Filled 2016-03-27: qty 1

## 2016-03-27 MED ORDER — ACETAMINOPHEN 325 MG PO TABS: 650.0000 mg | ORAL_TABLET | ORAL | Status: DC | PRN

## 2016-03-27 MED ORDER — DOCUSATE SODIUM 100 MG PO CAPS
100.0000 mg | ORAL_CAPSULE | Freq: Every day | ORAL | Status: DC
  Filled 2016-03-27 (×2): qty 1

## 2016-03-27 MED ORDER — ZOLPIDEM TARTRATE 5 MG PO TABS
5.0000 mg | ORAL_TABLET | Freq: Every evening | ORAL | Status: DC | PRN
  Administered 2016-03-27: 5 mg via ORAL
  Filled 2016-03-27: qty 1

## 2016-03-27 MED ORDER — BETAMETHASONE SOD PHOS & ACET 6 (3-3) MG/ML IJ SUSP
12.0000 mg | Freq: Once | INTRAMUSCULAR | Status: AC
Start: 2016-03-28 — End: 2016-03-28
  Administered 2016-03-28: 12 mg via INTRAMUSCULAR
  Filled 2016-03-27: qty 2

## 2016-03-27 NOTE — MAU Provider Note (Signed)
History   Pt in with c/o HA not relieved by tylenol at 1000, increased heartburn, and bilateral LE edema. Pt states she has lost the flexeril Rx given last time for HA. Denies any ROM, vag bleeding, or abd pain.    CSN: [id number]  Arrival date & time 03/27/16  1501   None     Chief Complaint  Patient presents with  . Headache  . swelling of legs    HPI  Past Medical History:  Diagnosis Date  . Anemia     Past Surgical History:  Procedure Laterality Date  . IUD REMOVAL    . NO PAST SURGERIES      History reviewed. No pertinent family history.  Social History  Substance Use Topics  . Smoking status: Never Smoker  . Smokeless tobacco: Never Used  . Alcohol use No    OB History    Gravida Para Term Preterm AB Living   8 6 6  0 1 6   SAB TAB Ectopic Multiple Live Births   1 0 0 0 6      Review of Systems  Constitutional: Negative.   Eyes: Positive for visual disturbance.  Respiratory: Negative.   Cardiovascular: Positive for leg swelling.  Gastrointestinal: Negative.   Endocrine: Negative.   Genitourinary: Negative.   Musculoskeletal: Negative.   Skin: Negative.   Allergic/Immunologic: Negative.   Neurological: Positive for headaches.  Hematological: Negative.   Psychiatric/Behavioral: Negative.     Allergies  Diflucan [fluconazole]; Sulfamethoxazole-trimethoprim; Latex; and Metronidazole  Home Medications    BP 158/89 (BP Location: Right Arm)   Pulse 73   Temp 98 F (36.7 C)   Resp 18   Ht 5\' 5"  (1.651 m)   Wt 212 lb (96.2 kg)   LMP 08/08/2015 (Approximate)   SpO2 99%   BMI 35.28 kg/m   Physical Exam  Constitutional: She is oriented to person, place, and time. She appears well-developed and well-nourished.  HENT:  Head: Normocephalic.  Eyes: Pupils are equal, round, and reactive to light.  Neck: Normal range of motion.  Cardiovascular: Normal rate, regular rhythm and normal heart sounds.   Pulmonary/Chest: Effort normal and breath  sounds normal.  Abdominal: Soft. Bowel sounds are normal.  Musculoskeletal: Normal range of motion.  Neurological: She is alert and oriented to person, place, and time. She has normal reflexes.  Skin: Skin is warm and dry.  Psychiatric: She has a normal mood and affect. Her behavior is normal. Judgment and thought content normal.    MAU Course  Procedures (including critical care time)  Labs Reviewed  URINALYSIS, [place] (NOT AT [facility]) - Abnormal; Notable for the following:       Result Value   Hgb urine dipstick SMALL (*)    Protein, ur 100 (*)    All other components within normal limits  URINE MICROSCOPIC-ADD ON - Abnormal; Notable for the following:    Squamous Epithelial / LPF 0-5 (*)    Bacteria, UA FEW (*)    All other components within normal limits  CBC WITH DIFFERENTIAL/PLATELET  COMPREHENSIVE METABOLIC PANEL  PROTEIN / CREATININE RATIO, URINE   No results found.   No diagnosis found.    MDM  Gestational Hypertension with superimposed preeclampsia   Plan: CBC, CMP, Protein/Creatine Ratio  POC discussed w/ Dr. Glo Herring. Pt to be admitted to ante, betamethasone BMX x2, close observation

## 2016-03-27 NOTE — MAU Note (Signed)
Patient presents to mau with c/o headache that started this am; does endorse episodes of blurred vision. Has not taken anything for the headache; states was given a "muscle relaxer" per scription but couldn't find it to take.   Also presents to c/o increase in leg and feet swelling. States it feels tight and tender.

## 2016-03-27 NOTE — H&P (Signed)
  History   Pt in with c/o HA not relieved by tylenol at 1000, increased heartburn, and bilateral LE edema. Pt states she has lost the flexeril Rx given last time for HA. Denies any ROM, vag bleeding, or abd pain.    CSN: [id number]  Arrival date & time 03/27/16  1501   None     Chief Complaint  Patient presents with  . Headache  . swelling of legs    HPI  Past Medical History:  Diagnosis Date  . Anemia     Past Surgical History:  Procedure Laterality Date  . IUD REMOVAL    . NO PAST SURGERIES      History reviewed. No pertinent family history.  Social History  Substance Use Topics  . Smoking status: Never Smoker  . Smokeless tobacco: Never Used  . Alcohol use No    OB History    Gravida Para Term Preterm AB Living   8 6 6  0 1 6   SAB TAB Ectopic Multiple Live Births   1 0 0 0 6      Review of Systems  Constitutional: Negative.   Eyes: Positive for visual disturbance.  Respiratory: Negative.   Cardiovascular: Positive for leg swelling.  Gastrointestinal: Negative.   Endocrine: Negative.   Genitourinary: Negative.   Musculoskeletal: Negative.   Skin: Negative.   Allergic/Immunologic: Negative.   Neurological: Positive for headaches.  Hematological: Negative.   Psychiatric/Behavioral: Negative.     Allergies  Diflucan [fluconazole]; Sulfamethoxazole-trimethoprim; Latex; and Metronidazole  Home Medications    BP 158/89 (BP Location: Right Arm)   Pulse 73   Temp 98 F (36.7 C)   Resp 18   Ht 5\' 5"  (1.651 m)   Wt 212 lb (96.2 kg)   LMP 08/08/2015 (Approximate)   SpO2 99%   BMI 35.28 kg/m   Physical Exam  Constitutional: She is oriented to person, place, and time. She appears well-developed and well-nourished.  HENT:  Head: Normocephalic.  Eyes: Pupils are equal, round, and reactive to light.  Neck: Normal range of motion.  Cardiovascular: Normal rate, regular rhythm and normal heart sounds.   Pulmonary/Chest: Effort normal and breath  sounds normal.  Abdominal: Soft. Bowel sounds are normal.  Musculoskeletal: Normal range of motion.  Neurological: She is alert and oriented to person, place, and time. She has normal reflexes.  Skin: Skin is warm and dry.  Psychiatric: She has a normal mood and affect. Her behavior is normal. Judgment and thought content normal.    MAU Course  Procedures (including critical care time)  Labs Reviewed  URINALYSIS, [place] (NOT AT [facility]) - Abnormal; Notable for the following:       Result Value   Hgb urine dipstick SMALL (*)    Protein, ur 100 (*)    All other components within normal limits  URINE MICROSCOPIC-ADD ON - Abnormal; Notable for the following:    Squamous Epithelial / LPF 0-5 (*)    Bacteria, UA FEW (*)    All other components within normal limits  CBC WITH DIFFERENTIAL/PLATELET  COMPREHENSIVE METABOLIC PANEL  PROTEIN / CREATININE RATIO, URINE   No results found.   No diagnosis found.    MDM  Gestational Hypertension with superimposed preeclampsia   Plan: CBC, CMP, Protein/Creatine Ratio  POC discussed w/ Dr. Glo Herring. Pt to be admitted to ante, betamethasone BMX x2, close observation

## 2016-03-28 LAB — GC/CHLAMYDIA PROBE AMP (~~LOC~~) NOT AT ARMC
Chlamydia: NEGATIVE
Neisseria Gonorrhea: NEGATIVE

## 2016-03-28 MED ORDER — SIMETHICONE 80 MG PO CHEW
80.0000 mg | CHEWABLE_TABLET | Freq: Once | ORAL | Status: AC
  Administered 2016-03-28: 80 mg via ORAL
  Filled 2016-03-28: qty 1

## 2016-03-28 MED ORDER — CYCLOBENZAPRINE HCL 10 MG PO TABS
10.0000 mg | ORAL_TABLET | Freq: Once | ORAL | Status: AC
  Administered 2016-03-28: 10 mg via ORAL
  Filled 2016-03-28: qty 1

## 2016-03-28 NOTE — Discharge Instructions (Signed)
Fetal Movement Counts °Patient Name: __________________________________________________ Patient Due Date: ____________________ °Performing a fetal movement count is highly recommended in high-risk pregnancies, but it is good for every pregnant woman to do. Your health care provider may ask you to start counting fetal movements at 28 weeks of the pregnancy. Fetal movements often increase: °· After eating a full meal. °· After physical activity. °· After eating or drinking something sweet or cold. °· At rest. °Pay attention to when you feel the baby is most active. This will help you notice a pattern of your baby's sleep and wake cycles and what factors contribute to an increase in fetal movement. It is important to perform a fetal movement count at the same time each day when your baby is normally most active.  °HOW TO COUNT FETAL MOVEMENTS °1. Find a quiet and comfortable area to sit or lie down on your left side. Lying on your left side provides the best blood and oxygen circulation to your baby. °2. Write down the day and time on a sheet of paper or in a journal. °3. Start counting kicks, flutters, swishes, rolls, or jabs in a 2-hour period. You should feel at least 10 movements within 2 hours. °4. If you do not feel 10 movements in 2 hours, wait 2-3 hours and count again. Look for a change in the pattern or not enough counts in 2 hours. °SEEK MEDICAL CARE IF: °· You feel less than 10 counts in 2 hours, tried twice. °· There is no movement in over an hour. °· The pattern is changing or taking longer each day to reach 10 counts in 2 hours. °· You feel the baby is not moving as he or she usually does. °Date: ____________ Movements: ____________ Start time: ____________ Finish time: ____________  °Date: ____________ Movements: ____________ Start time: ____________ Finish time: ____________ °Date: ____________ Movements: ____________ Start time: ____________ Finish time: ____________ °Date: ____________ Movements:  ____________ Start time: ____________ Finish time: ____________ °Date: ____________ Movements: ____________ Start time: ____________ Finish time: ____________ °Date: ____________ Movements: ____________ Start time: ____________ Finish time: ____________ °Date: ____________ Movements: ____________ Start time: ____________ Finish time: ____________ °Date: ____________ Movements: ____________ Start time: ____________ Finish time: ____________  °Date: ____________ Movements: ____________ Start time: ____________ Finish time: ____________ °Date: ____________ Movements: ____________ Start time: ____________ Finish time: ____________ °Date: ____________ Movements: ____________ Start time: ____________ Finish time: ____________ °Date: ____________ Movements: ____________ Start time: ____________ Finish time: ____________ °Date: ____________ Movements: ____________ Start time: ____________ Finish time: ____________ °Date: ____________ Movements: ____________ Start time: ____________ Finish time: ____________ °Date: ____________ Movements: ____________ Start time: ____________ Finish time: ____________  °Date: ____________ Movements: ____________ Start time: ____________ Finish time: ____________ °Date: ____________ Movements: ____________ Start time: ____________ Finish time: ____________ °Date: ____________ Movements: ____________ Start time: ____________ Finish time: ____________ °Date: ____________ Movements: ____________ Start time: ____________ Finish time: ____________ °Date: ____________ Movements: ____________ Start time: ____________ Finish time: ____________ °Date: ____________ Movements: ____________ Start time: ____________ Finish time: ____________ °Date: ____________ Movements: ____________ Start time: ____________ Finish time: ____________  °Date: ____________ Movements: ____________ Start time: ____________ Finish time: ____________ °Date: ____________ Movements: ____________ Start time: ____________ Finish  time: ____________ °Date: ____________ Movements: ____________ Start time: ____________ Finish time: ____________ °Date: ____________ Movements: ____________ Start time: ____________ Finish time: ____________ °Date: ____________ Movements: ____________ Start time: ____________ Finish time: ____________ °Date: ____________ Movements: ____________ Start time: ____________ Finish time: ____________ °Date: ____________ Movements: ____________ Start time: ____________ Finish time: ____________  °Date: ____________ Movements: ____________ Start time: ____________ Finish   time: ____________ Date: ____________ Movements: ____________ Start time: ____________ Christy [name] time: ____________ Date: ____________ Movements: ____________ Start time: ____________ Christy [name] time: ____________ Date: ____________ Movements: ____________ Start time: ____________ Christy [name] time: ____________ Date: ____________ Movements: ____________ Start time: ____________ Christy [name] time: ____________ Date: ____________ Movements: ____________ Start time: ____________ Christy [name] time: ____________ Date: ____________ Movements: ____________ Start time: ____________ Christy [name] time: ____________  Date: ____________ Movements: ____________ Start time: ____________ Christy [name] time: ____________ Date: ____________ Movements: ____________ Start time: ____________ Christy [name] time: ____________ Date: ____________ Movements: ____________ Start time: ____________ Christy [name] time: ____________ Date: ____________ Movements: ____________ Start time: ____________ Christy [name] time: ____________ Date: ____________ Movements: ____________ Start time: ____________ Christy [name] time: ____________ Date: ____________ Movements: ____________ Start time: ____________ Christy [name] time: ____________ Date: ____________ Movements: ____________ Start time: ____________ Christy [name] time: ____________  Date: ____________ Movements: ____________ Start time: ____________ Christy [name] time: ____________ Date: ____________  Movements: ____________ Start time: ____________ Christy [name] time: ____________ Date: ____________ Movements: ____________ Start time: ____________ Christy [name] time: ____________ Date: ____________ Movements: ____________ Start time: ____________ Christy [name] time: ____________ Date: ____________ Movements: ____________ Start time: ____________ Christy [name] time: ____________ Date: ____________ Movements: ____________ Start time: ____________ Christy [name] time: ____________ Date: ____________ Movements: ____________ Start time: ____________ Christy [name] time: ____________  Date: ____________ Movements: ____________ Start time: ____________ Christy [name] time: ____________ Date: ____________ Movements: ____________ Start time: ____________ Christy [name] time: ____________ Date: ____________ Movements: ____________ Start time: ____________ Christy [name] time: ____________ Date: ____________ Movements: ____________ Start time: ____________ Christy [name] time: ____________ Date: ____________ Movements: ____________ Start time: ____________ Christy [name] time: ____________ Date: ____________ Movements: ____________ Start time: ____________ Christy [name] time: ____________   This information is not intended to replace advice given to you by your health care provider. Make sure you discuss any questions you have with your health care provider.   Document Released: 08/24/2006 Document Revised: 08/15/2014 Document Reviewed: 05/21/2012 Elsevier Interactive Patient Education 2016 [employer]. Hypertension During Pregnancy Hypertension is also called high blood pressure. Blood pressure moves blood in your body. Sometimes, the force that moves the blood becomes too strong. When you are pregnant, this condition should be watched carefully. It can cause problems for you and your baby. HOME CARE   Make and keep all of your doctor visits.  Take medicine as told by your doctor. Tell your doctor about all medicines you take.  Eat very little salt.  Exercise regularly.  Do not  drink alcohol.  Do not smoke.  Do not have drinks with caffeine.  Lie on your left side when resting.  Your health care provider may ask you to take one low-dose aspirin (81mg ) each day. GET HELP RIGHT AWAY IF:  You have bad belly (abdominal) pain.  You have sudden puffiness (swelling) in the hands, ankles, or face.  You gain 4 pounds (1.8 kilograms) or more in 1 week.  You throw up (vomit) repeatedly.  You have bleeding from the vagina.  You do not feel the baby moving as much.  You have a headache.  You have blurred or double vision.  You have muscle twitching or spasms.  You have shortness of breath.  You have blue fingernails and lips.  You have blood in your pee (urine). MAKE SURE YOU:  Understand these instructions.  Will watch your condition.  Will get help right away if you are not doing well or get worse.   This information is not intended to replace advice given to you by your health care provider. Make sure you discuss any questions you have with your health care provider.   Document Released: 08/27/2010  Document Revised: 08/15/2014 Document Reviewed: 02/21/2013 Elsevier Interactive Patient Education [employer].

## 2016-03-28 NOTE — Progress Notes (Signed)
Pt c/o "bubblig" in her upper mid abdomen, states Tums provided no relief.  Pt informed will notify CNM.  CNM notified and new order received.

## 2016-03-28 NOTE — Progress Notes (Signed)
Pt requesting muscle relaxer, states feels tense in back of lower neck.  Pt informed MD will be notified for orders.  Pt verbalized understanding & agreeable.

## 2016-03-28 NOTE — Discharge Summary (Signed)
OB Discharge Summary     Patient Name: Christy [name] DOB: [birth year] MRN: [id number]  Date of admission: 03/27/2016  Date of discharge: 03/29/2016  Admitting diagnosis: 78 WKS, HEADACHE, LEGS SWOLLEN, PAIN Intrauterine pregnancy: [redacted]w[redacted]d     Secondary diagnosis:  Active Problems:   Antepartum mild preeclampsia  Additional problems: H/a on admission, resolved.  Treated for epigastric pain/sensation of gas pain with simethicone which helped. No RUQ pain.     Discharge diagnosis: preeclampsia without severe features                                                                                                Post partum procedures:Preeclampsia preterm without severe features   Complications: None  Hospital course:  Admitted with elevated BP and H/A, labwork wnl except P/C ratio elevated   Physical exam  Vitals:   03/28/16 0330 03/28/16 0754 03/28/16 1140 03/28/16 1542  BP: (!) 158/92 134/68  (!) 152/90  Pulse: 63 89  77  Resp: 18 20 18 20   Temp: 98 F (36.7 C) 98 F (36.7 C) 97.8 F (36.6 C) 98.2 F (36.8 C)  TempSrc: Oral Oral Oral Oral  SpO2:      Weight:      Height:       VS reviewed, nursing note reviewed,  Constitutional: well developed, well nourished, no distress HEENT: normocephalic CV: normal rate Pulm/chest wall: normal effort Abdomen: soft Neuro: alert and oriented x 3 Skin: warm, dry Psych: affect normal  Labs: Lab Results  Component Value Date   WBC 9.7 03/29/2016   HGB 10.2 (L) 03/29/2016   HCT 30.6 (L) 03/29/2016   MCV 82.0 03/29/2016   PLT 258 03/29/2016   CMP Latest Ref Rng & Units 03/29/2016  Glucose 65 - 99 mg/dL 86  BUN 6 - 20 mg/dL 12  Creatinine 0.44 - 1.00 mg/dL 0.80  Sodium 135 - 145 mmol/L 137  Potassium 3.5 - 5.1 mmol/L 3.6  Chloride 101 - 111 mmol/L 111  CO2 22 - 32 mmol/L 21(L)  Calcium 8.9 - 10.3 mg/dL 8.7(L)  Total Protein 6.5 - 8.1 g/dL 6.8  Total Bilirubin 0.3 - 1.2 mg/dL 0.1(L)  Alkaline Phos 38 - 126 U/L 164(H)   AST 15 - 41 U/L 21  ALT 14 - 54 U/L 17    Discharge instruction: Close follow up in office with twice weekly testing, IOL at 37 weeks unless severe features develop.  Preeclampsia precautions reviewed.  After visit meds:    Medication List    TAKE these medications   cyclobenzaprine 10 MG tablet Commonly known as:  FLEXERIL Take 1 tablet (10 mg total) by mouth 2 (two) times daily as needed for muscle spasms.   Prenatal Vitamins 0.8 MG tablet Take 1 tablet by mouth daily.       Diet: routine diet  Activity: Advance as tolerated. Pelvic rest for 6 weeks.   Outpatient follow up:Tomorrow as scheduled Follow up Appt: Future Appointments Date Time Provider [place]  04/03/2016 7:00 AM [facility] ROOM [facility] None   Follow up Visit:No Follow-up on file.  03/29/2016 LEFTWICH-KIRBY, Lattie Haw, CNM

## 2016-03-28 NOTE — Progress Notes (Signed)
Patient ID: Christy [name], female   DOB: 03-26-1981, 35 y.o.   MRN: [id number] [place]) NOTE  Christy [name] is a 35 y.o. [id number] at [redacted]w[redacted]d  who is admitted for PreE , to rule out severe features. Begun on [place] at [place] Sunday.   Fetal presentation is cephalic. Length of Stay:  1  Days  Subjective: Pt headache has resolved. Patient reports the fetal movement as active. Patient reports uterine contraction  activity as none. Patient reports  vaginal bleeding as none. Patient describes fluid per vagina as None.  Vitals:  Blood pressure (!) 158/92, pulse 63, temperature 98 F (36.7 C), temperature source Oral, resp. rate 18, height 5\' 5"  (1.651 m), weight 96.2 kg (212 lb), last menstrual period 08/08/2015, SpO2 99 %. Physical Examination:  General appearance - alert, well appearing, and in no distress Heart - normal rate and regular rhythm Abdomen - soft, nontender, nondistended Fundal Height:  size equals dates Cervical Exam: Not evaluated. and found to be / / and fetal presentation is cephalic. Extremities: extremities normal, atraumatic, no cyanosis or edema and Homans sign is negative, no sign of DVT with DTRs 2+ bilaterally Membranes:intact  Fetal Monitoring:  Baseline: 145 bpm and Variability: Good {> 6 bpm)  Labs:  Results for orders placed or performed during the hospital encounter of 03/27/16 (from the past 24 hour(s))  Urinalysis, Routine w reflex microscopic (not at [facility])   Collection Time: 03/27/16  3:07 PM  Result Value Ref Range   Color, Urine YELLOW YELLOW   APPearance CLEAR CLEAR   Specific Gravity, Urine 1.020 1.005 - 1.030   pH 6.0 5.0 - 8.0   Glucose, UA NEGATIVE NEGATIVE mg/dL   Hgb urine dipstick SMALL (A) NEGATIVE   Bilirubin Urine NEGATIVE NEGATIVE   Ketones, ur NEGATIVE NEGATIVE mg/dL   Protein, ur 100 (A) NEGATIVE mg/dL   Nitrite NEGATIVE NEGATIVE   Leukocytes, UA NEGATIVE NEGATIVE  Urine microscopic-add on   Collection  Time: 03/27/16  3:07 PM  Result Value Ref Range   Squamous Epithelial / LPF 0-5 (A) NONE SEEN   WBC, UA 0-5 0 - 5 WBC/hpf   RBC / HPF 0-5 0 - 5 RBC/hpf   Bacteria, UA FEW (A) NONE SEEN   Urine-Other MUCOUS PRESENT   Protein / creatinine ratio, urine   Collection Time: 03/27/16  3:07 PM  Result Value Ref Range   Creatinine, Urine 139.00 mg/dL   Total Protein, Urine 78 mg/dL   Protein Creatinine Ratio 0.56 (H) 0.00 - 0.15 mg/mg[Cre]  CBC with Differential/Platelet   Collection Time: 03/27/16  3:40 PM  Result Value Ref Range   WBC 5.6 4.0 - 10.5 K/uL   RBC 3.65 (L) 3.87 - 5.11 MIL/uL   Hemoglobin 10.0 (L) 12.0 - 15.0 g/dL   HCT 29.8 (L) 36.0 - 46.0 %   MCV 81.6 78.0 - 100.0 fL   MCH 27.4 26.0 - 34.0 pg   MCHC 33.6 30.0 - 36.0 g/dL   RDW 14.4 11.5 - 15.5 %   Platelets 221 150 - 400 K/uL   Neutrophils Relative % 68 %   Neutro Abs 3.8 1.7 - 7.7 K/uL   Lymphocytes Relative 21 %   Lymphs Abs 1.2 0.7 - 4.0 K/uL   Monocytes Relative 9 %   Monocytes Absolute 0.5 0.1 - 1.0 K/uL   Eosinophils Relative 2 %   Eosinophils Absolute 0.1 0.0 - 0.7 K/uL   Basophils Relative 0 %   Basophils Absolute 0.0 0.0 - 0.1  K/uL  Comprehensive metabolic panel   Collection Time: 03/27/16  3:40 PM  Result Value Ref Range   Sodium 134 (L) 135 - 145 mmol/L   Potassium 3.5 3.5 - 5.1 mmol/L   Chloride 108 101 - 111 mmol/L   CO2 19 (L) 22 - 32 mmol/L   Glucose, Bld 74 65 - 99 mg/dL   BUN 8 6 - 20 mg/dL   Creatinine, Ser 0.61 0.44 - 1.00 mg/dL   Calcium 8.4 (L) 8.9 - 10.3 mg/dL   Total Protein 6.5 6.5 - 8.1 g/dL   Albumin 2.8 (L) 3.5 - 5.0 g/dL   AST 23 15 - 41 U/L   ALT 18 14 - 54 U/L   Alkaline Phosphatase 173 (H) 38 - 126 U/L   Total Bilirubin 0.7 0.3 - 1.2 mg/dL   GFR calc non Af Amer >60 >60 mL/min   GFR calc Af Amer >60 >60 mL/min   Anion gap 7 5 - 15  Type and screen [place]   Collection Time: 03/27/16  4:39 PM  Result Value Ref Range   ABO/RH(D) B POS    Antibody  Screen NEG    Sample Expiration 03/30/2016     Imaging Studies:     Currently EPIC will not allow sonographic studies to automatically populate into notes.  In the meantime, copy and paste results into note or free text.  Medications:  Scheduled . betamethasone acetate-betamethasone sodium phosphate  12 mg Intramuscular Once  . docusate sodium  100 mg Oral Daily  . prenatal multivitamin  1 tablet Oral Q1200   I have reviewed the patient's current medications.  ASSESSMENT: Patient Active Problem List   Diagnosis Date Noted  . Antepartum mild preeclampsia 03/27/2016  . Diarrhea of presumed infectious origin 02/27/2016  . Supervision of normal subsequent pregnancy 10/14/2015  . Family history of coronary artery disease 07/22/2014  . MURMUR 08/31/2010  . ANEMIA, IRON DEFICIENCY 08/30/2010  . PALPITATIONS 08/30/2010    PLAN: 1 Complete second dose BMZ at 16:45, then d/c 2. [place] followup Thursday, with BPP to be scheduled 3.IOL scheduled for 7 am Sunday 8/27  Aspen Deterding V 03/28/2016,7:40 AM

## 2016-03-28 NOTE — Progress Notes (Signed)
Dr. Nehemiah Settle notified regarding pt's request to have discharge paperwork completed prior to 1700 secondary she "needs to leave by 1700."  Pt states if paperwork isn't completed in a timely fashion she will voice complaint and leave.

## 2016-03-29 ENCOUNTER — Inpatient Hospital Stay (HOSPITAL_COMMUNITY)
Admission: AD | Admit: 2016-03-29 | Discharge: 2016-04-01 | DRG: 775 | Disposition: A | Discharge status: Home / Self Care | Payer: Medicaid Other | Source: Ambulatory Visit | Attending: Family Medicine | Admitting: Family Medicine

## 2016-03-29 ENCOUNTER — Encounter (HOSPITAL_COMMUNITY): Payer: Self-pay

## 2016-03-29 ENCOUNTER — Ambulatory Visit (INDEPENDENT_AMBULATORY_CARE_PROVIDER_SITE_OTHER): Payer: Medicaid Other | Admitting: Advanced Practice Midwife

## 2016-03-29 VITALS — BP 150/81 | HR 70 | Wt 210.7 lb

## 2016-03-29 DIAGNOSIS — F419 Anxiety disorder, unspecified: Secondary | ICD-10-CM | POA: Diagnosis not present

## 2016-03-29 DIAGNOSIS — Z8249 Family history of ischemic heart disease and other diseases of the circulatory system: Secondary | ICD-10-CM

## 2016-03-29 DIAGNOSIS — Z3483 Encounter for supervision of other normal pregnancy, third trimester: Secondary | ICD-10-CM

## 2016-03-29 DIAGNOSIS — O1410 Severe pre-eclampsia, unspecified trimester: Secondary | ICD-10-CM | POA: Diagnosis present

## 2016-03-29 DIAGNOSIS — R0789 Other chest pain: Secondary | ICD-10-CM

## 2016-03-29 DIAGNOSIS — Z3A36 36 weeks gestation of pregnancy: Secondary | ICD-10-CM

## 2016-03-29 DIAGNOSIS — R1013 Epigastric pain: Secondary | ICD-10-CM

## 2016-03-29 DIAGNOSIS — O1414 Severe pre-eclampsia complicating childbirth: Secondary | ICD-10-CM | POA: Diagnosis present

## 2016-03-29 DIAGNOSIS — O1413 Severe pre-eclampsia, third trimester: Secondary | ICD-10-CM

## 2016-03-29 DIAGNOSIS — O1493 Unspecified pre-eclampsia, third trimester: Secondary | ICD-10-CM

## 2016-03-29 DIAGNOSIS — O1404 Mild to moderate pre-eclampsia, complicating childbirth: Secondary | ICD-10-CM | POA: Diagnosis present

## 2016-03-29 HISTORY — DX: Severe pre-eclampsia, unspecified trimester: O14.10

## 2016-03-29 LAB — URINE MICROSCOPIC-ADD ON: RBC / HPF: NONE SEEN RBC/hpf (ref 0–5)

## 2016-03-29 LAB — URINALYSIS, ROUTINE W REFLEX MICROSCOPIC
Bilirubin Urine: NEGATIVE
GLUCOSE, UA: NEGATIVE mg/dL
Ketones, ur: NEGATIVE mg/dL
Leukocytes, UA: NEGATIVE
Nitrite: NEGATIVE
PH: 6 (ref 5.0–8.0)
PROTEIN: 100 mg/dL — AB
Specific Gravity, Urine: 1.025 (ref 1.005–1.030)

## 2016-03-29 LAB — POCT URINALYSIS DIP (DEVICE)
Bilirubin Urine: NEGATIVE
Glucose, UA: NEGATIVE mg/dL
Ketones, ur: NEGATIVE mg/dL
NITRITE: NEGATIVE
PH: 6 (ref 5.0–8.0)
Specific Gravity, Urine: 1.02 (ref 1.005–1.030)
Urobilinogen, UA: 0.2 mg/dL (ref 0.0–1.0)

## 2016-03-29 LAB — CBC
HEMATOCRIT: 30.5 % — AB (ref 36.0–46.0)
HEMATOCRIT: 30.6 % — AB (ref 36.0–46.0)
HEMOGLOBIN: 10.2 g/dL — AB (ref 12.0–15.0)
Hemoglobin: 10.1 g/dL — ABNORMAL LOW (ref 12.0–15.0)
MCH: 27.2 pg (ref 26.0–34.0)
MCH: 27.3 pg (ref 26.0–34.0)
MCHC: 33.1 g/dL (ref 30.0–36.0)
MCHC: 33.3 g/dL (ref 30.0–36.0)
MCV: 82 fL (ref 78.0–100.0)
MCV: 82 fL (ref 78.0–100.0)
Platelets: 238 10*3/uL (ref 150–400)
Platelets: 258 10*3/uL (ref 150–400)
RBC: 3.72 MIL/uL — ABNORMAL LOW (ref 3.87–5.11)
RBC: 3.73 MIL/uL — ABNORMAL LOW (ref 3.87–5.11)
RDW: 14.9 % (ref 11.5–15.5)
RDW: 14.9 % (ref 11.5–15.5)
WBC: 9.8 10*3/uL (ref 4.0–10.5)
WBC: 9.7 10*3/uL (ref 4.0–10.5)

## 2016-03-29 LAB — COMPREHENSIVE METABOLIC PANEL
ALBUMIN: 2.8 g/dL — AB (ref 3.5–5.0)
ALK PHOS: 164 U/L — AB (ref 38–126)
ALT: 17 U/L (ref 14–54)
ANION GAP: 5 (ref 5–15)
AST: 21 U/L (ref 15–41)
BILIRUBIN TOTAL: 0.1 mg/dL — AB (ref 0.3–1.2)
BUN: 12 mg/dL (ref 6–20)
CALCIUM: 8.7 mg/dL — AB (ref 8.9–10.3)
CO2: 21 mmol/L — ABNORMAL LOW (ref 22–32)
Chloride: 111 mmol/L (ref 101–111)
Creatinine, Ser: 0.8 mg/dL (ref 0.44–1.00)
GLUCOSE: 86 mg/dL (ref 65–99)
POTASSIUM: 3.6 mmol/L (ref 3.5–5.1)
Sodium: 137 mmol/L (ref 135–145)
TOTAL PROTEIN: 6.8 g/dL (ref 6.5–8.1)

## 2016-03-29 LAB — PROTEIN / CREATININE RATIO, URINE
CREATININE, URINE: 249 mg/dL
PROTEIN CREATININE RATIO: 0.77 mg/mg{creat} — AB (ref 0.00–0.15)
TOTAL PROTEIN, URINE: 191 mg/dL

## 2016-03-29 LAB — TYPE AND SCREEN
ABO/RH(D): B POS
Antibody Screen: NEGATIVE

## 2016-03-29 MED ORDER — OXYCODONE-ACETAMINOPHEN 5-325 MG PO TABS: 2.0000 | ORAL_TABLET | ORAL | Status: DC | PRN

## 2016-03-29 MED ORDER — PENICILLIN G POTASSIUM 5000000 UNITS IJ SOLR
5.0000 10*6.[IU] | Freq: Once | INTRAVENOUS | Status: DC
  Administered 2016-03-29: 5 10*6.[IU] via INTRAVENOUS
  Filled 2016-03-29: qty 5

## 2016-03-29 MED ORDER — TERBUTALINE SULFATE 1 MG/ML IJ SOLN: 0.2500 mg | Freq: Once | INTRAMUSCULAR | Status: DC | PRN

## 2016-03-29 MED ORDER — OXYCODONE-ACETAMINOPHEN 5-325 MG PO TABS
1.0000 | ORAL_TABLET | ORAL | Status: DC | PRN
Start: 2016-03-29 — End: 2016-03-30

## 2016-03-29 MED ORDER — HYDRALAZINE HCL 20 MG/ML IJ SOLN
10.0000 mg | Freq: Once | INTRAMUSCULAR | Status: AC | PRN
  Administered 2016-03-30: 10 mg via INTRAVENOUS
  Filled 2016-03-29: qty 1

## 2016-03-29 MED ORDER — OXYTOCIN BOLUS FROM INFUSION
500.0000 mL | Freq: Once | INTRAVENOUS | Status: AC
  Administered 2016-03-30: 500 mL/h via INTRAVENOUS

## 2016-03-29 MED ORDER — ONDANSETRON HCL 4 MG/2ML IJ SOLN
4.0000 mg | Freq: Four times a day (QID) | INTRAMUSCULAR | Status: DC | PRN
  Administered 2016-03-30: 4 mg via INTRAVENOUS
  Filled 2016-03-29: qty 2

## 2016-03-29 MED ORDER — FLEET ENEMA 7-19 GM/118ML RE ENEM: 1.0000 | ENEMA | RECTAL | Status: DC | PRN

## 2016-03-29 MED ORDER — SOD CITRATE-CITRIC ACID 500-334 MG/5ML PO SOLN
30.0000 mL | ORAL | Status: DC | PRN
  Administered 2016-03-30: 30 mL via ORAL
  Filled 2016-03-29: qty 15

## 2016-03-29 MED ORDER — LABETALOL HCL 5 MG/ML IV SOLN
20.0000 mg | INTRAVENOUS | Status: AC | PRN
  Administered 2016-03-29 (×3): 20 mg via INTRAVENOUS
  Filled 2016-03-29 (×3): qty 4

## 2016-03-29 MED ORDER — ACETAMINOPHEN 325 MG PO TABS
650.0000 mg | ORAL_TABLET | Freq: Once | ORAL | Status: AC
  Administered 2016-03-29: 650 mg via ORAL
  Filled 2016-03-29: qty 2

## 2016-03-29 MED ORDER — LACTATED RINGERS IV SOLN: 500.0000 mL | INTRAVENOUS | Status: DC | PRN

## 2016-03-29 MED ORDER — LACTATED RINGERS IV SOLN
INTRAVENOUS | Status: DC
  Administered 2016-03-29: 13:00:00 via INTRAVENOUS
  Administered 2016-03-29: 125 mL/h via INTRAVENOUS

## 2016-03-29 MED ORDER — MAGNESIUM SULFATE BOLUS VIA INFUSION
4.0000 g | Freq: Once | INTRAVENOUS | Status: AC
  Administered 2016-03-29: 4 g via INTRAVENOUS
  Filled 2016-03-29: qty 500

## 2016-03-29 MED ORDER — OXYTOCIN 40 UNITS IN LACTATED RINGERS INFUSION - SIMPLE MED
2.5000 [IU]/h | INTRAVENOUS | Status: DC
  Filled 2016-03-29: qty 1000

## 2016-03-29 MED ORDER — PENICILLIN G POTASSIUM 5000000 UNITS IJ SOLR
2.5000 10*6.[IU] | INTRAVENOUS | Status: DC
  Filled 2016-03-29 (×3): qty 2.5

## 2016-03-29 MED ORDER — LIDOCAINE HCL (PF) 1 % IJ SOLN
30.0000 mL | INTRAMUSCULAR | Status: DC | PRN
  Filled 2016-03-29 (×2): qty 30

## 2016-03-29 MED ORDER — ACETAMINOPHEN 325 MG PO TABS
650.0000 mg | ORAL_TABLET | ORAL | Status: DC | PRN
  Administered 2016-03-29 – 2016-03-30 (×2): 650 mg via ORAL
  Filled 2016-03-29 (×2): qty 2

## 2016-03-29 MED ORDER — CYCLOBENZAPRINE HCL 5 MG PO TABS
5.0000 mg | ORAL_TABLET | Freq: Once | ORAL | Status: AC
  Administered 2016-03-29: 5 mg via ORAL
  Filled 2016-03-29: qty 1

## 2016-03-29 MED ORDER — MAGNESIUM SULFATE 50 % IJ SOLN
2.0000 g/h | INTRAMUSCULAR | Status: DC
  Administered 2016-03-30 – 2016-03-31 (×2): 2 g/h via INTRAVENOUS
  Filled 2016-03-29 (×3): qty 80

## 2016-03-29 MED ORDER — MISOPROSTOL 25 MCG QUARTER TABLET
25.0000 ug | ORAL_TABLET | ORAL | Status: DC | PRN
  Administered 2016-03-29 – 2016-03-30 (×3): 25 ug via VAGINAL
  Filled 2016-03-29: qty 0.25
  Filled 2016-03-29: qty 1
  Filled 2016-03-29 (×2): qty 0.25

## 2016-03-29 NOTE — Progress Notes (Addendum)
Labor Progress Note [place] Christy [name] is a 35 y.o. [phone] at [redacted]w[redacted]d presented for IOL 2/2 preeclampsia with severe features  S: Patient is doing well. Wants to get this labor going. Patient is feeling contractions every 5 minutes   O:  BP (!) 168/97   Pulse 90   Temp 97.7 F (36.5 C) (Oral)   Resp 18   Ht 5\' 5"  (1.651 m)   Wt 210 lb (95.3 kg)   LMP 08/08/2015 (Approximate)   SpO2 99%   BMI 34.95 kg/m  EFM: 150/minimal variablity/accels minimal   CVE: Dilation: Fingertip Effacement (%): 70 Cervical Position: Anterior Station: -3 Presentation: Vertex Exam by:: dr Emmaline Life   A&P: 35 y.o. [id number] [redacted]w[redacted]d for  IOL 2/2 preeclampsia with severe features  #Labor: Continue Cytotec  #Pain: Epidural  #FWB:Category 2, patient on mag therefore likely reason for minimal variability  #GBS GBS unknown BP - continue labetalol   Alveta Quintela Criss Rosales, MD 9:28 PM

## 2016-03-29 NOTE — MAU Note (Signed)
Pt sent over from clinic to be evaluated due to reoccurring chest pain, SOB, and increased blood pressures. Pt c/o a headache today. Pt has had pressure and tightness in her upper chest for the last week on and off. Pt has also had SOB for this time period and was told it was normal. Pt states baby is moving normally. Pt denies contractions, bleeding, and leaking of fluid.

## 2016-03-29 NOTE — H&P (Signed)
LABOR AND DELIVERY ADMISSION HISTORY AND PHYSICAL NOTE  Christy [name] is a 35 y.o. female [phone] with IUP at [redacted]w[redacted]d by 8.3wk U/S presenting for IOL 2/2 preeclampsia with severe features. Sent from clinic for high BP 150/81 with HA, SOB, epigastric pain. In MAU found to have high increasing BP 162/90, 174/99.   She reports positive fetal movement. She denies leakage of fluid or vaginal bleeding.  Prenatal History/Complications:  Past Medical History: Past Medical History:  Diagnosis Date  . Anemia   . Pregnancy induced hypertension     Past Surgical History: Past Surgical History:  Procedure Laterality Date  . IUD REMOVAL    . NO PAST SURGERIES      Obstetrical History: OB History    Gravida Para Term Preterm AB Living   8 6 6  0 1 6   SAB TAB Ectopic Multiple Live Births   1 0 0 0 6      Social History: Social History   Social History  . Marital status: Married    Spouse name: N/A  . Number of children: N/A  . Years of education: N/A   Social History Main Topics  . Smoking status: Never Smoker  . Smokeless tobacco: Never Used  . Alcohol use No  . Drug use: No  . Sexual activity: Yes    Birth control/ protection: None     Comment: IUD removed 03/2015   Other Topics Concern  . None   Social History Narrative  . None    Family History: Family History  Problem Relation Age of Onset  . Hypertension Mother   . Hypertension Father   . Hypertension Sister   . Hypertension Maternal Grandmother     Allergies: Allergies  Allergen Reactions  . Diflucan [Fluconazole]     Mouth and vagina area breaks out into hives  . Sulfamethoxazole-Trimethoprim Hives  . Latex Rash  . Metronidazole Rash    If takes longer than 5 days will develop a rash    Prescriptions Prior to Admission  Medication Sig Dispense Refill Last Dose  . acetaminophen (TYLENOL) 500 MG tablet Take 500 mg by mouth every 6 (six) hours as needed for headache.   03/27/2016  . calcium carbonate  (TUMS - DOSED IN MG ELEMENTAL CALCIUM) 500 MG chewable tablet Chew 2 tablets by mouth 2 (two) times daily as needed for indigestion or heartburn.   03/28/2016 at Unknown time  . cyclobenzaprine (FLEXERIL) 10 MG tablet Take 1 tablet (10 mg total) by mouth 2 (two) times daily as needed for muscle spasms. 20 tablet 0 03/28/2016 at Unknown time  . diphenhydramine-acetaminophen (TYLENOL PM) 25-500 MG TABS tablet Take 1 tablet by mouth at bedtime as needed (for sleep).   03/28/2016 at Unknown time  . Prenatal Multivit-Min-Fe-FA (PRENATAL VITAMINS) 0.8 MG tablet Take 1 tablet by mouth daily. (Patient taking differently: Take 1 tablet by mouth daily. ) 30 tablet 12 03/28/2016 at Unknown time     Review of Systems   All systems reviewed and negative except as stated in HPI  Blood pressure 175/99, pulse 70, height 5\' 5"  (1.651 m), weight 95.3 kg (210 lb), last menstrual period 08/08/2015, SpO2 98 %. General appearance: alert, cooperative and no distress Lungs: no respiratory distress Heart: regular rate Abdomen: soft, non-tender Extremities: No calf swelling or tenderness Presentation: cephalic by cervical exam Fetal monitoring: 130 baseline, moderate variability, +accelerations, no decelerations Uterine activity: occasional/few Dilation: Fingertip Effacement (%): Thick Station: -3 Exam by:: dr Pace Lamadrid   Prenatal labs:  ABO, Rh: --/--/B POS (08/20 1639) Antibody: NEG (08/20 1639) Rubella: immune RPR: NON REAC (06/26 1501)  HBsAg: NEGATIVE (03/08 1023)  HIV: NONREACTIVE (06/26 1501)  GBS:    Genetic screening:  normal Anatomy US: normal  Prenatal Transfer Tool  Maternal Diabetes: No Genetic Screening: Normal Maternal Ultrasounds/Referrals: Normal Fetal Ultrasounds or other Referrals:  None Maternal Substance Abuse:  No Significant Maternal Medications:  None Significant Maternal Lab Results: Lab values include: Other: GBS unknown  Results for orders placed or performed during the  hospital encounter of 03/29/16 (from the past 24 hour(s))  Urinalysis, Routine w reflex microscopic (not at [facility])   Collection Time: 03/29/16 10:20 AM  Result Value Ref Range   Color, Urine YELLOW YELLOW   APPearance CLEAR CLEAR   Specific Gravity, Urine 1.025 1.005 - 1.030   pH 6.0 5.0 - 8.0   Glucose, UA NEGATIVE NEGATIVE mg/dL   Hgb urine dipstick TRACE (A) NEGATIVE   Bilirubin Urine NEGATIVE NEGATIVE   Ketones, ur NEGATIVE NEGATIVE mg/dL   Protein, ur 100 (A) NEGATIVE mg/dL   Nitrite NEGATIVE NEGATIVE   Leukocytes, UA NEGATIVE NEGATIVE  Protein / creatinine ratio, urine   Collection Time: 03/29/16 10:20 AM  Result Value Ref Range   Creatinine, Urine 249.00 mg/dL   Total Protein, Urine PENDING mg/dL   Protein Creatinine Ratio PENDING 0.00 - 0.15 mg/mg[Cre]  Urine microscopic-add on   Collection Time: 03/29/16 10:20 AM  Result Value Ref Range   Squamous Epithelial / LPF 0-5 (A) NONE SEEN   WBC, UA 0-5 0 - 5 WBC/hpf   RBC / HPF NONE SEEN 0 - 5 RBC/hpf   Bacteria, UA FEW (A) NONE SEEN   Urine-Other MUCOUS PRESENT   CBC   Collection Time: 03/29/16 10:53 AM  Result Value Ref Range   WBC 9.8 4.0 - 10.5 K/uL   RBC 3.72 (L) 3.87 - 5.11 MIL/uL   Hemoglobin 10.1 (L) 12.0 - 15.0 g/dL   HCT 30.5 (L) 36.0 - 46.0 %   MCV 82.0 78.0 - 100.0 fL   MCH 27.2 26.0 - 34.0 pg   MCHC 33.1 30.0 - 36.0 g/dL   RDW 14.9 11.5 - 15.5 %   Platelets 238 150 - 400 K/uL  Comprehensive metabolic panel   Collection Time: 03/29/16 10:53 AM  Result Value Ref Range   Sodium 137 135 - 145 mmol/L   Potassium 3.6 3.5 - 5.1 mmol/L   Chloride 111 101 - 111 mmol/L   CO2 21 (L) 22 - 32 mmol/L   Glucose, Bld 86 65 - 99 mg/dL   BUN 12 6 - 20 mg/dL   Creatinine, Ser 0.80 0.44 - 1.00 mg/dL   Calcium 8.7 (L) 8.9 - 10.3 mg/dL   Total Protein 6.8 6.5 - 8.1 g/dL   Albumin 2.8 (L) 3.5 - 5.0 g/dL   AST 21 15 - 41 U/L   ALT 17 14 - 54 U/L   Alkaline Phosphatase 164 (H) 38 - 126 U/L   Total Bilirubin 0.1 (L) 0.3 -  1.2 mg/dL   GFR calc non Af Amer >60 >60 mL/min   GFR calc Af Amer >60 >60 mL/min   Anion gap 5 5 - 15  Results for orders placed or performed in visit on 03/29/16 (from the past 24 hour(s))  POCT urinalysis dip (device)   Collection Time: 03/29/16 10:52 AM  Result Value Ref Range   Glucose, UA NEGATIVE NEGATIVE mg/dL   Bilirubin Urine NEGATIVE NEGATIVE   Ketones, ur  NEGATIVE NEGATIVE mg/dL   Specific Gravity, Urine 1.020 1.005 - 1.030   Hgb urine dipstick TRACE (A) NEGATIVE   pH 6.0 5.0 - 8.0   Protein, ur >=300 (A) NEGATIVE mg/dL   Urobilinogen, UA 0.2 0.0 - 1.0 mg/dL   Nitrite NEGATIVE NEGATIVE   Leukocytes, UA TRACE (A) NEGATIVE    Patient Active Problem List   Diagnosis Date Noted  . Antepartum mild preeclampsia 03/27/2016  . Diarrhea of presumed infectious origin 02/27/2016  . Supervision of normal subsequent pregnancy 10/14/2015  . Family history of coronary artery disease 07/22/2014  . MURMUR 08/31/2010  . ANEMIA, IRON DEFICIENCY 08/30/2010  . PALPITATIONS 08/30/2010    Assessment: Malee Juniper is a 35 y.o. [id number] at [redacted]w[redacted]d here for IOL 2/2 preeclampsia w/severe features  #Labor:cytotec, labetol protocol, mag gtt #Pain: epidural #FWB: Category I #ID:  GBS unknown #MOF: breast #MOC:mirena #Circ:  outpatient  Bufford Lope, DO PGY-1 8/22/201711:45 AM   OB FELLOW HISTORY AND PHYSICAL ATTESTATION  I have seen and examined this patient; I agree with above documentation in the resident's note.    Jacquiline Doe 03/29/2016, 1:10 PM

## 2016-03-29 NOTE — Anesthesia Pain Management Evaluation Note (Signed)
  CRNA Pain Management Visit Note  Patient: Christy [name], 36 y.o., female  "Hello I am a member of the anesthesia team at [facility]. We have an anesthesia team available at all times to provide care throughout the hospital, including epidural management and anesthesia for C-section. I don't know your plan for the delivery whether it a natural birth, water birth, IV sedation, nitrous supplementation, doula or epidural, but we want to meet your pain goals."   1.Was your pain managed to your expectations on prior hospitalizations?   Yes   2.What is your expectation for pain management during this hospitalization?     Epidural  3.How can we help you reach that goal? Epidural, but I don't want to get it too late!  Record the patient's initial score and the patient's pain goal.   Pain: 3  Pain Goal: 5 [facility] wants you to be able to say your pain was always managed very well.  Cleland Simkins 03/29/2016

## 2016-03-30 ENCOUNTER — Inpatient Hospital Stay (HOSPITAL_COMMUNITY): Payer: Medicaid Other | Admitting: Anesthesiology

## 2016-03-30 ENCOUNTER — Encounter (HOSPITAL_COMMUNITY): Payer: Self-pay | Admitting: *Deleted

## 2016-03-30 DIAGNOSIS — O1410 Severe pre-eclampsia, unspecified trimester: Secondary | ICD-10-CM

## 2016-03-30 DIAGNOSIS — Z3A36 36 weeks gestation of pregnancy: Secondary | ICD-10-CM

## 2016-03-30 DIAGNOSIS — O1414 Severe pre-eclampsia complicating childbirth: Secondary | ICD-10-CM

## 2016-03-30 LAB — CBC
HEMATOCRIT: 31.2 % — AB (ref 36.0–46.0)
HEMATOCRIT: 33.7 % — AB (ref 36.0–46.0)
HEMOGLOBIN: 10.3 g/dL — AB (ref 12.0–15.0)
Hemoglobin: 11 g/dL — ABNORMAL LOW (ref 12.0–15.0)
MCH: 26.9 pg (ref 26.0–34.0)
MCH: 27 pg (ref 26.0–34.0)
MCHC: 33 g/dL (ref 30.0–36.0)
MCHC: 32.6 g/dL (ref 30.0–36.0)
MCV: 81.5 fL (ref 78.0–100.0)
MCV: 82.8 fL (ref 78.0–100.0)
Platelets: 228 10*3/uL (ref 150–400)
Platelets: 241 10*3/uL (ref 150–400)
RBC: 3.83 MIL/uL — AB (ref 3.87–5.11)
RBC: 4.07 MIL/uL (ref 3.87–5.11)
RDW: 15.3 % (ref 11.5–15.5)
RDW: 15.3 % (ref 11.5–15.5)
WBC: 7.9 10*3/uL (ref 4.0–10.5)
WBC: 8.7 10*3/uL (ref 4.0–10.5)

## 2016-03-30 LAB — MAGNESIUM: MAGNESIUM: 5.2 mg/dL — AB (ref 1.7–2.4)

## 2016-03-30 LAB — RPR: RPR: NONREACTIVE

## 2016-03-30 MED ORDER — EPHEDRINE 5 MG/ML INJ
10.0000 mg | INTRAVENOUS | Status: DC | PRN
  Filled 2016-03-30: qty 4

## 2016-03-30 MED ORDER — HYDRALAZINE HCL 20 MG/ML IJ SOLN
10.0000 mg | Freq: Once | INTRAMUSCULAR | Status: AC
Start: 2016-03-30 — End: 2016-03-30
  Administered 2016-03-30: 10 mg via INTRAVENOUS

## 2016-03-30 MED ORDER — LACTATED RINGERS IV SOLN
INTRAVENOUS | Status: DC
  Administered 2016-03-31: via INTRAVENOUS

## 2016-03-30 MED ORDER — DIBUCAINE 1 % RE OINT: 1.0000 "application " | TOPICAL_OINTMENT | RECTAL | Status: DC | PRN

## 2016-03-30 MED ORDER — SENNOSIDES-DOCUSATE SODIUM 8.6-50 MG PO TABS
2.0000 | ORAL_TABLET | ORAL | Status: DC
  Administered 2016-03-31 – 2016-04-01 (×2): 2 via ORAL
  Filled 2016-03-30 (×2): qty 2

## 2016-03-30 MED ORDER — ONDANSETRON HCL 4 MG PO TABS: 4.0000 mg | ORAL_TABLET | ORAL | Status: DC | PRN

## 2016-03-30 MED ORDER — SOD CITRATE-CITRIC ACID 500-334 MG/5ML PO SOLN
30.0000 mL | Freq: Once | ORAL | Status: AC
  Administered 2016-03-30: 30 mL via ORAL

## 2016-03-30 MED ORDER — PHENYLEPHRINE 40 MCG/ML (10ML) SYRINGE FOR IV PUSH (FOR BLOOD PRESSURE SUPPORT)
80.0000 ug | PREFILLED_SYRINGE | INTRAVENOUS | Status: DC | PRN
  Filled 2016-03-30: qty 5

## 2016-03-30 MED ORDER — COCONUT OIL OIL: 1.0000 "application " | TOPICAL_OIL | Status: DC | PRN

## 2016-03-30 MED ORDER — DM-GUAIFENESIN ER 30-600 MG PO TB12: 1.0000 | ORAL_TABLET | Freq: Two times a day (BID) | ORAL | Status: DC

## 2016-03-30 MED ORDER — SIMETHICONE 80 MG PO CHEW
80.0000 mg | CHEWABLE_TABLET | ORAL | Status: DC | PRN
  Administered 2016-03-31 (×2): 80 mg via ORAL
  Filled 2016-03-30 (×2): qty 1

## 2016-03-30 MED ORDER — HYDRALAZINE HCL 20 MG/ML IJ SOLN
INTRAMUSCULAR | Status: AC
  Filled 2016-03-30: qty 1

## 2016-03-30 MED ORDER — MISOPROSTOL 200 MCG PO TABS
ORAL_TABLET | ORAL | Status: AC
  Filled 2016-03-30: qty 4

## 2016-03-30 MED ORDER — PHENYLEPHRINE 40 MCG/ML (10ML) SYRINGE FOR IV PUSH (FOR BLOOD PRESSURE SUPPORT)
80.0000 ug | PREFILLED_SYRINGE | INTRAVENOUS | Status: DC | PRN
  Filled 2016-03-30: qty 5
  Filled 2016-03-30: qty 10

## 2016-03-30 MED ORDER — MISOPROSTOL 200 MCG PO TABS: 800.0000 ug | ORAL_TABLET | Freq: Once | ORAL | Status: DC

## 2016-03-30 MED ORDER — ZOLPIDEM TARTRATE 5 MG PO TABS
5.0000 mg | ORAL_TABLET | Freq: Once | ORAL | Status: AC
  Administered 2016-03-30: 5 mg via ORAL
  Filled 2016-03-30: qty 1

## 2016-03-30 MED ORDER — IBUPROFEN 600 MG PO TABS
600.0000 mg | ORAL_TABLET | Freq: Four times a day (QID) | ORAL | Status: DC
  Administered 2016-03-30 – 2016-04-01 (×7): 600 mg via ORAL
  Filled 2016-03-30 (×8): qty 1

## 2016-03-30 MED ORDER — ACETAMINOPHEN 325 MG PO TABS: 650.0000 mg | ORAL_TABLET | ORAL | Status: DC | PRN

## 2016-03-30 MED ORDER — OXYTOCIN 40 UNITS IN LACTATED RINGERS INFUSION - SIMPLE MED
1.0000 m[IU]/min | INTRAVENOUS | Status: DC
  Administered 2016-03-30: 1 m[IU]/min via INTRAVENOUS

## 2016-03-30 MED ORDER — ONDANSETRON HCL 4 MG/2ML IJ SOLN: 4.0000 mg | INTRAMUSCULAR | Status: DC | PRN

## 2016-03-30 MED ORDER — ZOLPIDEM TARTRATE 5 MG PO TABS: 5.0000 mg | ORAL_TABLET | Freq: Every evening | ORAL | Status: DC | PRN

## 2016-03-30 MED ORDER — BENZOCAINE-MENTHOL 20-0.5 % EX AERO: 1.0000 "application " | INHALATION_SPRAY | CUTANEOUS | Status: DC | PRN

## 2016-03-30 MED ORDER — AMLODIPINE BESYLATE 10 MG PO TABS
10.0000 mg | ORAL_TABLET | Freq: Every day | ORAL | Status: DC
  Administered 2016-03-30 – 2016-04-01 (×3): 10 mg via ORAL
  Filled 2016-03-30 (×3): qty 1

## 2016-03-30 MED ORDER — WITCH HAZEL-GLYCERIN EX PADS: 1.0000 "application " | MEDICATED_PAD | CUTANEOUS | Status: DC | PRN

## 2016-03-30 MED ORDER — FENTANYL 2.5 MCG/ML BUPIVACAINE 1/10 % EPIDURAL INFUSION (WH - ANES)
14.0000 mL/h | INTRAMUSCULAR | Status: DC | PRN
  Administered 2016-03-30: 14 mL/h via EPIDURAL
  Filled 2016-03-30: qty 125

## 2016-03-30 MED ORDER — DIPHENHYDRAMINE HCL 50 MG/ML IJ SOLN
12.5000 mg | INTRAMUSCULAR | Status: DC | PRN
  Filled 2016-03-30: qty 1

## 2016-03-30 MED ORDER — GUAIFENESIN ER 600 MG PO TB12
600.0000 mg | ORAL_TABLET | Freq: Two times a day (BID) | ORAL | Status: DC
  Administered 2016-03-30 – 2016-03-31 (×2): 600 mg via ORAL
  Filled 2016-03-30 (×4): qty 1

## 2016-03-30 MED ORDER — SOD CITRATE-CITRIC ACID 500-334 MG/5ML PO SOLN
ORAL | Status: AC
  Filled 2016-03-30: qty 15

## 2016-03-30 MED ORDER — LACTATED RINGERS IV SOLN
500.0000 mL | Freq: Once | INTRAVENOUS | Status: AC
  Administered 2016-03-30: 500 mL via INTRAVENOUS

## 2016-03-30 MED ORDER — DIPHENHYDRAMINE HCL 25 MG PO CAPS: 25.0000 mg | ORAL_CAPSULE | Freq: Four times a day (QID) | ORAL | Status: DC | PRN

## 2016-03-30 MED ORDER — PRENATAL MULTIVITAMIN CH
1.0000 | ORAL_TABLET | Freq: Every day | ORAL | Status: DC
  Administered 2016-03-31: 1 via ORAL
  Filled 2016-03-30: qty 1

## 2016-03-30 MED ORDER — TETANUS-DIPHTH-ACELL PERTUSSIS 5-2.5-18.5 LF-MCG/0.5 IM SUSP: 0.5000 mL | Freq: Once | INTRAMUSCULAR | Status: DC

## 2016-03-30 MED ORDER — OXYTOCIN 40 UNITS IN LACTATED RINGERS INFUSION - SIMPLE MED: 1.0000 m[IU]/min | INTRAVENOUS | Status: DC

## 2016-03-30 MED ORDER — LIDOCAINE HCL (PF) 1 % IJ SOLN
INTRAMUSCULAR | Status: DC | PRN
  Administered 2016-03-30: 4 mL
  Administered 2016-03-30: 6 mL via EPIDURAL

## 2016-03-30 NOTE — Progress Notes (Signed)
Subjective:  Christy [name] is a 35 y.o. [id number] at [redacted]w[redacted]d being seen today for ongoing prenatal care.  She is currently monitored for the following issues for this high-risk pregnancy and has ANEMIA, IRON DEFICIENCY; PALPITATIONS; MURMUR; Family history of coronary artery disease; Supervision of normal subsequent pregnancy; Diarrhea of presumed infectious origin; Antepartum mild preeclampsia; and Preeclampsia, severe on her problem list.  Patient reports epigastric and chest pain.   .  .  Movement: Present. Denies leaking of fluid.   The following portions of the patient's history were reviewed and updated as appropriate: allergies, current medications, past family history, past medical history, past social history, past surgical history and problem list. Problem list updated.  Objective:   Vitals:   03/29/16 0928  BP: (!) 150/81  Pulse: 70  Weight: 210 lb 11.2 oz (95.6 kg)    Fetal Status: Fetal Heart Rate (bpm): 147   Movement: Present     General:  Alert, oriented and cooperative. Patient is in no acute distress.  Skin: Skin is warm and dry. No rash noted.   Cardiovascular: Normal heart rate noted  Respiratory: Normal respiratory effort, no problems with respiration noted  Abdomen: Soft, gravid, appropriate for gestational age. Pain/Pressure: Present     Pelvic:  Cervical exam deferred        Extremities: Normal range of motion.  Edema: Trace  Mental Status: Normal mood and affect. Normal behavior. Normal judgment and thought content.   Urinalysis:      Assessment and Plan:  Pregnancy: [id number] at [redacted]w[redacted]d  1. Preeclampsia, third trimester --Without severe features, plan to deliver at 37 weeks. Pt with epigastric pain today so sent to MAU for further evaluation.  2. Epigastric pain --Describes pain in upper abdomen under ribs, more on left than right side and radiating up into chest that is intermittent, occurring 2-3 x daily, and worse when she is anxious  3. Other chest  pain --Describes chest pain as radiating up from epigastric pain and as pressure that comes and goes 2-3 x /day  4. Anxiety --Pt does not use the word anxiety but describes how she gets "hyped up" more in this pregnancy than in the past and has chest tightening when she gets "hyped up"   Pt sent to MAU for further evaluation of preeclampsia symptoms and to review timing of delivery.  Elvera Maria, CNM

## 2016-03-30 NOTE — Anesthesia Preprocedure Evaluation (Signed)
Anesthesia Evaluation  Patient identified by MRN, date of birth, ID band Patient awake    Reviewed: Allergy & Precautions, H&P , Patient's Chart, lab work & pertinent test results  Airway Mallampati: II  TM Distance: >3 FB Neck ROM: full    Dental  (+) Teeth Intact   Pulmonary    breath sounds clear to auscultation       Cardiovascular hypertension,  Rhythm:regular Rate:Normal     Neuro/Psych    GI/Hepatic   Endo/Other    Renal/GU      Musculoskeletal   Abdominal   Peds  Hematology   Anesthesia Other Findings    PIH; Normal Plts   Reproductive/Obstetrics (+) Pregnancy                             Anesthesia Physical Anesthesia Plan  ASA: III  Anesthesia Plan: Epidural   Post-op Pain Management:    Induction:   Airway Management Planned:   Additional Equipment:   Intra-op Plan:   Post-operative Plan:   Informed Consent: I have reviewed the patients History and Physical, chart, labs and discussed the procedure including the risks, benefits and alternatives for the proposed anesthesia with the patient or authorized representative who has indicated his/her understanding and acceptance.   Dental Advisory Given  Plan Discussed with:   Anesthesia Plan Comments: (Labs checked- platelets confirmed with RN in room. Fetal heart tracing, per RN, reported to be stable enough for sitting procedure. Discussed epidural, and patient consents to the procedure:  included risk of possible headache,backache, failed block, allergic reaction, and nerve injury. This patient was asked if she had any questions or concerns before the procedure started.)        Anesthesia Quick Evaluation

## 2016-03-30 NOTE — Lactation Note (Signed)
This note was copied from a baby's chart. Lactation Consultation Note  Patient Name: Christy [name] [id number] Date: 03/30/2016 Reason for consult: Initial assessment;Infant < 6lbs;Late preterm infant Breastfeeding consultation services and support and Caring for a Late Preterm Baby information given.  Mom voices she is very tired and needs to sleep.  Baby in nursery now due to low temp.  Explained to mom the importance of initiating pumping to establish a good milk supply.  Mom doesn't feel like pumping now.  Pump set up and instructed to inform the nurse when she is ready to start.  Instructed to pump every 3 hours x 15 minutes.  Teaching delayed until mom is feeling better.  Maternal Data Does the patient have breastfeeding experience prior to this delivery?: Yes  Feeding    LATCH Score/Interventions                      Lactation Tools Discussed/Used Pump Review: Setup, frequency, and cleaning Initiated by:: [place] Date initiated:: 03/30/16   Consult Status Consult Status: Follow-up Date: 03/31/16 Follow-up type: In-patient    Ave Filter 03/30/2016, 3:43 PM

## 2016-03-30 NOTE — Progress Notes (Signed)
Labor Progress Note Christy [name] is a 35 y.o. [phone] at [redacted]w[redacted]d presented for IOL 2/2 preeclampsia with severe features  S: No issues, feeling contractions.   O:  BP (!) 145/80   Pulse 97   Temp 98 F (36.7 C) (Oral)   Resp (!) 22   Ht 5\' 5"  (1.651 m)   Wt 210 lb (95.3 kg)   LMP 08/08/2015 (Approximate)   SpO2 99%   BMI 34.95 kg/m  EFM: 145/minimal variablity/accels minimal/ variables present   CVE: Dilation: 1.5 Effacement (%): 70 Cervical Position: Anterior Station: -3 Presentation: Vertex Exam by:: dr Emmaline Life   A&P: 35 y.o. [id number] [redacted]w[redacted]d for  IOL 2/2 preeclampsia with severe features  #Labor: Foley bulb @ 1:30 AM + cytotec   #Pain: Epidural  #FWB:Category 2  #GBS GBS unknown   [name], MD 2:04 AM

## 2016-03-30 NOTE — Anesthesia Procedure Notes (Addendum)

## 2016-03-30 NOTE — Progress Notes (Signed)
Dr. Namon Cirri notified of pt c/o of new onset of dry hacking cough and confusion--provider aware that breath sounds clear reflexes +1--provider to order magnesium level

## 2016-03-30 NOTE — Progress Notes (Signed)
Patient ID: Christy [name], female   DOB: 1981-02-16, 35 y.o.   MRN: [id number]  Patient seen & examined for progress of induction. Patient comfortable in bed.  SVE: 6/70/-2 AROM performed, minimal clear fluid returned.  IUPC placed.  Continue induction of labor, increase pit 2x2, currently not adequate contractions.

## 2016-03-30 NOTE — Anesthesia Postprocedure Evaluation (Signed)
Anesthesia Post Note  Patient: Christy [name]  Procedure(s) Performed: * No procedures listed *  Patient location during evaluation: Mother Baby Anesthesia Type: Epidural Level of consciousness: awake and alert and oriented Pain management: satisfactory to patient Vital Signs Assessment: post-procedure vital signs reviewed and stable Respiratory status: spontaneous breathing and nonlabored ventilation Cardiovascular status: stable Postop Assessment: no headache, no backache, no signs of nausea or vomiting, adequate PO intake and patient able to bend at knees (patient up walking) Anesthetic complications: no     Last Vitals:  Vitals:   03/30/16 1700 03/30/16 1800  BP: (!) 142/82 (!) 146/86  Pulse: 98 98  Resp: 16 18  Temp:      Last Pain:  Vitals:   03/30/16 1800  TempSrc:   PainSc: 2    Pain Goal: Patients Stated Pain Goal: 2 (03/30/16 1800)               Willa Rough

## 2016-03-31 ENCOUNTER — Encounter: Payer: Self-pay | Admitting: *Deleted

## 2016-03-31 MED ORDER — SODIUM CHLORIDE 0.9% FLUSH: 3.0000 mL | INTRAVENOUS | Status: DC | PRN

## 2016-03-31 MED ORDER — SODIUM CHLORIDE 0.9% FLUSH
3.0000 mL | Freq: Two times a day (BID) | INTRAVENOUS | Status: DC
  Administered 2016-03-31 (×2): 3 mL via INTRAVENOUS

## 2016-03-31 MED ORDER — SODIUM CHLORIDE 0.9 % IV SOLN: 250.0000 mL | INTRAVENOUS | Status: DC | PRN

## 2016-03-31 NOTE — Lactation Note (Addendum)
This note was copied from a baby's chart. Lactation Consultation Note   Experienced BF mom has 4.8 lb LPI that needs to be supplemented. Discussed this with mom the baby needs extra calories after BF. Mom stated that the baby is BF fine and she has plenty of breast milk. That she wants to BF her baby and he has been BF fine and staff was over reacting wanting to supplement. Explained yes we want mom to BF and give any colostrum to him first before the formula. Mom has LPI supplementation sheet. Gave Alimentum. Mom stated the baby didn't like the bottle nipple. Discussed other options of feeding in syring, or foley cup. Mom stated she would try that. Instructed mom how to use w/measuring cups. Mom stated she would feed him for the next feeding. Mom was agitated at first, then listened and agreed to supplement and thanked me. Reported to nursery RN. Gave bullet to hand expressed colostrum in to give to baby. Encouraged to use DEBP for stimulation and supplementing. Patient Name: Christy [name] [id number] Date: 03/31/2016 Reason for consult: Follow-up assessment;Infant < 6lbs   Maternal Data     Feeding Feeding Type: Breast Fed Length of feed: 10 min  LATCH Score/Interventions                      Lactation Tools Discussed/Used Tools: Pump Breast pump type: Double-Electric Breast Pump   Consult Status Consult Status: Follow-up Date: 03/31/16 Follow-up type: In-patient    Rawlinson, Elta Guadeloupe 03/31/2016, 1:34 AM

## 2016-03-31 NOTE — Lactation Note (Signed)
This note was copied from a baby's chart. Lactation Consultation Note  Patient Name: Christy [name] [id number] Date: 03/31/2016  Mom states baby is latching and nursing well and she is post pumping with DEBP. Obtaining small amounts of colostrum and feeding back to baby with syringe.  Good waking techniques and breast massage encouraged during feeding.  Instructed to call out for assist/concerns prn.   Maternal Data    Feeding Feeding Type: Breast Fed Length of feed: 5 min  LATCH Score/Interventions                      Lactation Tools Discussed/Used     Consult Status      Ave Filter 03/31/2016, 3:33 PM

## 2016-03-31 NOTE — Progress Notes (Signed)
Post Partum Day 1 Subjective: up ad lib, voiding, tolerating PO, + flatus and has mild HA with dry mouth  Objective: Blood pressure 130/68, pulse 77, temperature 98.2 F (36.8 C), temperature source Oral, resp. rate 16, height 5\' 5"  (1.651 m), weight 95.3 kg (210 lb), last menstrual period 08/08/2015, SpO2 98 %, unknown if currently breastfeeding.  Physical Exam:  General: alert, cooperative and no distress  CV: RRR, no murmurs noted Lungs: CTAB Lochia: appropriate Uterine Fundus: firm DVT Evaluation: No evidence of DVT seen on physical exam. Negative Homan's sign.   Recent Labs  03/30/16 0144 03/30/16 0958  HGB 10.3* 11.0*  HCT 31.2* 33.7*    Assessment/Plan: Plan for discharge tomorrow  Observe BP off mag today. Pt on 10mg  of norvasc   LOS: 2 days   Bufford Lope PGY-1 03/31/2016, 9:34 AM    OB FELLOW DISCHARGE ATTESTATION  I have seen and examined this patient and agree with above documentation in the resident's note.   Jacquiline Doe, MD 9:58 AM

## 2016-04-01 ENCOUNTER — Ambulatory Visit: Payer: Self-pay

## 2016-04-01 MED ORDER — IBUPROFEN 600 MG PO TABS: 600.0000 mg | ORAL_TABLET | Freq: Four times a day (QID) | ORAL | 0 refills | Status: DC

## 2016-04-01 MED ORDER — AMLODIPINE BESYLATE 10 MG PO TABS: 10.0000 mg | ORAL_TABLET | Freq: Every day | ORAL | 0 refills | Status: DC

## 2016-04-01 MED ORDER — SENNOSIDES-DOCUSATE SODIUM 8.6-50 MG PO TABS: 2.0000 | ORAL_TABLET | ORAL | 0 refills | Status: DC

## 2016-04-01 NOTE — Progress Notes (Signed)
D/c instructions given, questions answered, states understanding, signed and given copy.

## 2016-04-01 NOTE — Discharge Instructions (Signed)

## 2016-04-01 NOTE — Discharge Summary (Signed)
OB Discharge Summary     Patient Name: Christy [name] DOB: 05-05-1981 MRN: [id number]  Date of admission: 03/29/2016 Delivering MD: Serita Grammes D   Date of discharge: 04/01/2016  Admitting diagnosis: 48WKS, CHEST PAIN Intrauterine pregnancy: [redacted]w[redacted]d     Secondary diagnosis:  Active Problems:   Preeclampsia, severe   SVD (spontaneous vaginal delivery)      Discharge diagnosis: Preterm Pregnancy Delivered                                                                                                Post partum procedures:none  Augmentation: AROM, Pitocin, Cytotec and Foley Balloon  Complications: None  Hospital course:  Induction of Labor With Vaginal Delivery   35 y.o. yo [id number] at [redacted]w[redacted]d was admitted to the hospital 03/29/2016 for induction of labor.  Indication for induction: Preeclampsia with severe features.  Patient had an uncomplicated labor course as follows: Membrane Rupture Time/Date: 6:00 AM ,03/30/2016   Intrapartum Procedures: Episiotomy:                                           Lacerations:  None [1]  Patient had delivery of a Viable infant.  Information for the patient's newborn:  Elora, [place] [id number]  Delivery Method: Vag-Spont   03/30/2016  Details of delivery can be found in separate delivery note.  Patient had a routine postpartum course. Patient is discharged home 04/01/16.  Pt was placed on magnesium on admission and had blood pressure monitoring. Post partum pt was placed on magnesium for 24 hours. She was placed on amlodipine 10mg  with good BP control.    Physical exam Vitals:   03/31/16 2200 03/31/16 2300 04/01/16 0005 04/01/16 0550  BP:   (!) 142/81 139/75  Pulse:   86 77  Resp: 18 16 18 18   Temp:   98.1 F (36.7 C)   TempSrc:   Oral   SpO2:      Weight:      Height:       General: alert, cooperative and no distress Lochia: appropriate Uterine Fundus: firm DVT Evaluation: No evidence of DVT seen on physical exam. Labs: Lab  Results  Component Value Date   WBC 8.7 03/30/2016   HGB 11.0 (L) 03/30/2016   HCT 33.7 (L) 03/30/2016   MCV 82.8 03/30/2016   PLT 241 03/30/2016   CMP Latest Ref Rng & Units 03/29/2016  Glucose 65 - 99 mg/dL 86  BUN 6 - 20 mg/dL 12  Creatinine 0.44 - 1.00 mg/dL 0.80  Sodium 135 - 145 mmol/L 137  Potassium 3.5 - 5.1 mmol/L 3.6  Chloride 101 - 111 mmol/L 111  CO2 22 - 32 mmol/L 21(L)  Calcium 8.9 - 10.3 mg/dL 8.7(L)  Total Protein 6.5 - 8.1 g/dL 6.8  Total Bilirubin 0.3 - 1.2 mg/dL 0.1(L)  Alkaline Phos 38 - 126 U/L 164(H)  AST 15 - 41 U/L 21  ALT 14 - 54 U/L 17    Discharge instruction: per After  Visit Summary and "Baby and Me Booklet".  After visit meds:    Medication List    TAKE these medications   acetaminophen 500 MG tablet Commonly known as:  TYLENOL Take 500 mg by mouth every 6 (six) hours as needed for headache.   amLODipine 10 MG tablet Commonly known as:  NORVASC Take 1 tablet (10 mg total) by mouth daily.   calcium carbonate 500 MG chewable tablet Commonly known as:  TUMS - dosed in mg elemental calcium Chew 2 tablets by mouth 2 (two) times daily as needed for indigestion or heartburn.   cyclobenzaprine 10 MG tablet Commonly known as:  FLEXERIL Take 1 tablet (10 mg total) by mouth 2 (two) times daily as needed for muscle spasms.   diphenhydramine-acetaminophen 25-500 MG Tabs tablet Commonly known as:  TYLENOL PM Take 1 tablet by mouth at bedtime as needed (for sleep).   ibuprofen 600 MG tablet Commonly known as:  ADVIL,MOTRIN Take 1 tablet (600 mg total) by mouth every 6 (six) hours.   Prenatal Vitamins 0.8 MG tablet Take 1 tablet by mouth daily.   senna-docusate 8.6-50 MG tablet Commonly known as:  Senokot-S Take 2 tablets by mouth daily.       Diet: routine diet  Activity: Advance as tolerated. Pelvic rest for 6 weeks.   Outpatient follow up:1 week for BP check Follow up Appt: Future Appointments Date Time Provider [place]   05/23/2016 2:40 PM Deirdre Freida Busman, CNM [facility]   Follow up Visit: [place] [facility]. Schedule an appointment as soon as possible for a visit in 1 week(s).   Specialty:  Obstetrics and Gynecology Why:  for blood pressure check postpartum Contact information: [place] [phone]       [place] .   Why:  as needed for any potential emergencies Contact information: [place] [phone]          Postpartum contraception: IUD Mirena  Newborn Data: Live born female  Birth Weight: 4 lb 8.8 oz (2065 g) APGAR: 5, 9  Baby Feeding: Breast Disposition:home with mother   04/01/2016 Bufford Lope, DO PGY-1   OB FELLOW DISCHARGE ATTESTATION  I have seen and examined this patient and agree with above documentation in the resident's note.   Jacquiline Doe, MD 11:09 AM

## 2016-04-01 NOTE — Lactation Note (Signed)
This note was copied from a baby's chart. Lactation Consultation Note  Patient Name: Christy [name] [id number] Date: 04/01/2016  Mom continues to report good feedings at breast.  She is post pumping and giving expressed milk to baby.  Encouraged to call for feeding assessment or assist.   Maternal Data    Feeding Feeding Type: Breast Fed Length of feed: 10 min  LATCH Score/Interventions                      Lactation Tools Discussed/Used     Consult Status      Ave Filter 04/01/2016, 2:19 PM

## 2016-04-02 ENCOUNTER — Ambulatory Visit: Payer: Self-pay

## 2016-04-02 NOTE — Lactation Note (Signed)
This note was copied from a baby's chart. Lactation Consultation Note: Experienced BF mom reports baby has been nursing well. Reports milk is turning white and breasts are feeling heavier today. Reports breasts are softening after nursing. Baby last fed 1 hour ago and is asleep in mom's arms. States she has not been offering supplement after nursing because he is getting more now. Gaining weight and 7 wet and 3 stools in last 24 hours. Plans to use manual pump at home- does not want DEBP- states she always uses manual. No questions at present To call prn  Patient Name: Christy [name] Aas [id number] Date: 04/02/2016 Reason for consult: Follow-up assessment;Infant < 6lbs;Late preterm infant   Maternal Data Formula Feeding for Exclusion: No Has patient been taught Hand Expression?: Yes Does the patient have breastfeeding experience prior to this delivery?: Yes  Feeding Feeding Type: Breast Fed Length of feed: 15 min  LATCH Score/Interventions                      Lactation Tools Discussed/Used     Consult Status Consult Status: Complete    Truddie Crumble 04/02/2016, 1:07 PM

## 2016-04-03 ENCOUNTER — Inpatient Hospital Stay (HOSPITAL_COMMUNITY): Payer: Medicaid Other

## 2016-04-03 ENCOUNTER — Inpatient Hospital Stay (HOSPITAL_COMMUNITY): Admit: 2016-04-03 | Payer: Medicaid Other

## 2016-04-08 ENCOUNTER — Ambulatory Visit: Payer: Medicaid Other | Admitting: General Practice

## 2016-04-08 VITALS — BP 133/77 | HR 91 | Ht 65.0 in | Wt 185.0 lb

## 2016-04-08 DIAGNOSIS — R011 Cardiac murmur, unspecified: Secondary | ICD-10-CM

## 2016-04-08 NOTE — Progress Notes (Signed)
Patient here BP check today & request cardiology referral for her chest pain related to her murmur & heart palpitations. Patient states she was supposed to be referred in pregnancy but could never get a soon appt. Patient reports headaches but states they are relieved with tylenol. Patient denies blurry vision or dizziness. Reviewed patient's vitals/request for referral with Dr Nehemiah Settle who is agreeable to referral & is fine with patient's blood pressure today. Patient may follow up at pp visit. Scheduled appt with [place] for 10/2 @ 815. Informed patient. Patient verbalized understanding to all & had no questions

## 2016-05-09 ENCOUNTER — Ambulatory Visit (INDEPENDENT_AMBULATORY_CARE_PROVIDER_SITE_OTHER): Payer: Medicaid Other | Admitting: Cardiovascular Disease

## 2016-05-09 ENCOUNTER — Encounter: Payer: Self-pay | Admitting: Cardiovascular Disease

## 2016-05-09 VITALS — BP 128/84 | HR 83 | Ht 65.0 in | Wt 190.4 lb

## 2016-05-09 DIAGNOSIS — R0902 Hypoxemia: Secondary | ICD-10-CM

## 2016-05-09 DIAGNOSIS — R079 Chest pain, unspecified: Secondary | ICD-10-CM | POA: Diagnosis not present

## 2016-05-09 NOTE — Patient Instructions (Addendum)
Medication Instructions:  Your physician recommends that you continue on your current medications as directed. Please refer to the Current Medication list given to you today.  Labwork: NONE  Testing/Procedures: A chest x-ray takes a picture of the organs and structures inside the chest, including the heart, lungs, and blood vessels. This test can show several things, including, whether the heart is enlarges; whether fluid is building up in the lungs; and whether pacemaker / defibrillator leads are still in place.  VQ SCAN   Your physician has requested that you have an exercise tolerance test. For further information please visit [email or web address]. Please also follow instruction sheet, as given.  Follow-Up: Your physician recommends that you schedule a follow-up appointment in: [place]  If you need a refill on your cardiac medications before your next appointment, please call your pharmacy.  Ventilation-Perfusion Scan A ventilation-perfusion scan is a scan to look at the airflow (ventilation) and blood flow (perfusion) in your lungs. It is most often used to look for blood clots that may have traveled to your lungs. During this scan, radioactive compounds are injected into your body or are breathed in (inhale). These radioactive compounds are detected by a special camera during the scan, are given at very low doses, are not harmful to you, and last in your body for a very short time.  LET [facility] CARE PROVIDER KNOW ABOUT:  Any allergies you have.  All medicines you are taking, including vitamins, herbs, eye drops, creams, and over-the-counter medicines.  Any blood disorders you have.  Previous surgeries you have had.  Medical conditions you have.  Possibility of pregnancy, if this applies.  Breastfeeding, if this applies. RISKS AND COMPLICATIONS Generally, this is a safe procedure. However, as with any procedure, complications can occur. A possible complication includes  having an allergic reaction to the radioactive compounds.  BEFORE THE PROCEDURE  Do not smoke before your test.  Take medicine as directed by your health care provider. PROCEDURE  A small needle will be placed in a vein in your arm or hand. This needle will stay in place for the entire exam.  A small amount of very short-acting radioactive material will be injected.  Your lungs will then be scanned using a special camera. This camera will record the images.  You will be asked to inhale a second radioactive compound. After this, the lungs are scanned again. AFTER THE PROCEDURE  You may go home unless your health care provider instructs you differently.  You may continue with normal activities and diet as instructed by your health care provider.   This information is not intended to replace advice given to you by your health care provider. Make sure you discuss any questions you have with your health care provider.   Document Released: 07/22/2000 Document Revised: 08/15/2014 Document Reviewed: 02/07/2013 Elsevier Interactive Patient Education [employer].

## 2016-05-09 NOTE — Progress Notes (Signed)
Cardiology Office Note   Date:  05/09/2016   ID:  Christy [name], DOB 1980-09-23, MRN [id number]  PCP:  Default, Provider, MD  Cardiologist:   Skeet Latch, MD   No chief complaint on file.     History of Present Illness: Christy [name] is a 35 y.o. female who presents for an evaluation of chest pain.  Christy [name] delivered a baby at 21 weeks 03/2016.  She was induced early due to preeclampsia.  During her pregnancy she developed 5/10 discomfort under her left breast that felt like a gas bubble.  However, she tried taking gas medication and Tums without relief.  The pain started approximately two months ago and happened every other day.  Each episode lasted 1-2 hours and was worse with exertion.  It was also worse when sitting down or after eating.  There was no associated shortness of breath, nausea or diaphoresis.  Since delivering her baby she continues to have the pain, though less severe and less frequent. She notes that when her blood pressure was elevated or when she gets upset the pain was much worse. She has been pregnant 7 other times and never had this pain during pregnancy.  She also reports a history of GERD that presented as substernal chest burning and was distinct from the pain under her left breast.    Christy [name] also reports a sensation in her chest that feels like little shocks all across her chest wall.  She describes it as feeling like there is anxiety in her chest.  This has been occurring intermittently for the last three weeks.  It occurs at rest and lasts for 30 minutes.  There is no associated chest pain or shortness of breath.  She denies lightheadedness, dizziness, orthopnea or PND.  She does no drink alcohol or caffeine.    Past Medical History:  Diagnosis Date  . Anemia   . Pregnancy induced hypertension     Past Surgical History:  Procedure Laterality Date  . IUD REMOVAL    . NO PAST SURGERIES       Current Outpatient Prescriptions  Medication  Sig Dispense Refill  . acetaminophen (TYLENOL) 500 MG tablet Take 500 mg by mouth every 6 (six) hours as needed for headache.    . calcium carbonate (TUMS - DOSED IN MG ELEMENTAL CALCIUM) 500 MG chewable tablet Chew 2 tablets by mouth 2 (two) times daily as needed for indigestion or heartburn.    . cyclobenzaprine (FLEXERIL) 10 MG tablet Take 1 tablet (10 mg total) by mouth 2 (two) times daily as needed for muscle spasms. 20 tablet 0  . diphenhydramine-acetaminophen (TYLENOL PM) 25-500 MG TABS tablet Take 1 tablet by mouth at bedtime as needed (for sleep).    Marland Kitchen ibuprofen (ADVIL,MOTRIN) 600 MG tablet Take 1 tablet (600 mg total) by mouth every 6 (six) hours. 30 tablet 0  . Prenatal Multivit-Min-Fe-FA (PRENATAL VITAMINS) 0.8 MG tablet Take 1 tablet by mouth daily. (Patient taking differently: Take 1 tablet by mouth daily. ) 30 tablet 12  . senna-docusate (SENOKOT-S) 8.6-50 MG tablet Take 2 tablets by mouth daily. 30 tablet 0   No current facility-administered medications for this visit.     Allergies:   Diflucan [fluconazole]; Sulfamethoxazole-trimethoprim; Latex; and Metronidazole    Social History:  The patient  reports that she has never smoked. She has never used smokeless tobacco. She reports that she does not drink alcohol or use drugs.   Family History:  The patient's family history  includes Heart attack in her father; Hypertension in her father, maternal grandmother, mother, and sister; Stroke in her father.    ROS:  Please see the history of present illness.   Otherwise, review of systems are positive for none.   All other systems are reviewed and negative.    PHYSICAL EXAM: VS:  BP 128/84   Pulse 83   Ht 5\' 5"  (1.651 m)   Wt 190 lb 6.4 oz (86.4 kg)   SpO2 91%   BMI 31.68 kg/m  , BMI Body mass index is 31.68 kg/m. GENERAL:  Well appearing HEENT:  Pupils equal round and reactive, fundi not visualized, oral mucosa unremarkable NECK:  No jugular venous distention, waveform  within normal limits, carotid upstroke brisk and symmetric, no bruits, no thyromegaly LYMPHATICS:  No cervical adenopathy LUNGS:  Clear to auscultation bilaterally HEART:  RRR.  PMI not displaced or sustained,S1 and S2 within normal limits, no S3, no S4, no clicks, no rubs, no murmurs ABD:  Flat, positive bowel sounds normal in frequency in pitch, no bruits, no rebound, no guarding, no midline pulsatile mass, no hepatomegaly, no splenomegaly EXT:  2 plus pulses throughout, no edema, no cyanosis no clubbing SKIN:  No rashes no nodules NEURO:  Cranial nerves II through XII grossly intact, motor grossly intact throughout PSYCH:  Cognitively intact, oriented to person place and time    EKG:  EKG is ordered today. The ekg ordered today demonstrates sinus rhythm rate 71 bpm.  Low voltage.     Echo 07/31/14: Study Conclusions  - Left ventricle: The cavity size was normal. Wall thickness was increased in a pattern of mild LVH. Systolic function was normal. The estimated ejection fraction was in the range of 55% to 60%. Wall motion was normal; there were no regional wall motion abnormalities. Doppler parameters are consistent with abnormal left ventricular relaxation (grade 1 diastolic dysfunction). - Aorta: There was mild dilation of the right coronary sinus(es) of Valsalva.  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  Recent Labs: 03/29/2016: ALT 17; BUN 12; Creatinine, Ser 0.80; Potassium 3.6; Sodium 137 03/30/2016: Hemoglobin 11.0; Magnesium 5.2; Platelets 241    Lipid Panel No results found for: CHOL, TRIG, HDL, CHOLHDL, VLDL, LDLCALC, LDLDIRECT    Wt Readings from Last 3 Encounters:  05/09/16 190 lb 6.4 oz (86.4 kg)  04/08/16 185 lb (83.9 kg)  03/29/16 210 lb (95.3 kg)      ASSESSMENT AND PLAN:  # Atypical chest pain: Christy [name] chest pain is very  atypical.  However, there is an exertional component and she has a known coronary artery aneurysm.  We will get an ETT to evaluate for ischemia.  She is also mildly hypoxic with an oxygen saturation of 91%.  Given her recent delivery she is at increased risk for DVT/PE.  We will get a V/Q scan.  # Pre-eclampsia: Blood pressure has returned to baseline.  We discussed the fact that she is at risk for developing essential hypertension given that her blood pressure was elevated in pregnancy and because of her family history.  We discussed the importance of limiting salt intake and regular exercise.    Current medicines are reviewed at length with the patient today.  The patient does not have concerns regarding medicines.  The following changes have been made:  no change  Labs/ tests ordered today include:   Orders Placed This Encounter  Procedures  .  DG Chest 2 View  . NM Pulmonary Perf and Vent  . Exercise Tolerance Test  . EKG 12-Lead     Disposition:   FU with Salem Mastrogiovanni C. Oval Linsey, MD, [facility] in 1 month    This note was written with the assistance of speech recognition software.  Please excuse any transcriptional errors.  Signed, Dashana Guizar C. Oval Linsey, MD, [facility]  05/09/2016 10:07 PM    [place]

## 2016-05-16 ENCOUNTER — Telehealth: Payer: Self-pay | Admitting: Cardiovascular Disease

## 2016-05-16 ENCOUNTER — Ambulatory Visit (HOSPITAL_COMMUNITY)
Admission: RE | Admit: 2016-05-16 | Discharge: 2016-05-16 | Disposition: A | Discharge status: Home / Self Care | Payer: Medicaid Other | Source: Ambulatory Visit | Attending: Cardiovascular Disease | Admitting: Cardiovascular Disease

## 2016-05-16 ENCOUNTER — Encounter (HOSPITAL_COMMUNITY): Payer: Self-pay

## 2016-05-16 ENCOUNTER — Encounter (HOSPITAL_COMMUNITY)
Admission: RE | Admit: 2016-05-16 | Discharge: 2016-05-16 | Disposition: A | Discharge status: Home / Self Care | Payer: Medicaid Other | Source: Ambulatory Visit | Attending: Cardiovascular Disease | Admitting: Cardiovascular Disease

## 2016-05-16 ENCOUNTER — Other Ambulatory Visit: Payer: Self-pay | Admitting: *Deleted

## 2016-05-16 DIAGNOSIS — R072 Precordial pain: Secondary | ICD-10-CM

## 2016-05-16 DIAGNOSIS — R079 Chest pain, unspecified: Secondary | ICD-10-CM | POA: Insufficient documentation

## 2016-05-16 DIAGNOSIS — R0902 Hypoxemia: Secondary | ICD-10-CM | POA: Diagnosis present

## 2016-05-16 NOTE — Telephone Encounter (Signed)
Spoke BILL to radiology at [facility] pATIENT IS THERE TO HAVE VQ SCAN- patient is nursing a 73 weeks old.  Patient is aware she needs to pump and dump breast milk- due to procedure. Patient is concerned because baby is under weight and may be hospitalized if weight gain does not occur.  Bill wanted to discuss options for test.  RN gave pager number for Dr Oval Linsey to [facility] for discussion.

## 2016-05-16 NOTE — Telephone Encounter (Signed)
Christy [name] presented for V/Q scan today and requests to have a CT-A for ruling out PE, as she is currently breast feeding.  We will arrange for this change in plan.   Dayvon Dax C. Oval Linsey, MD, [facility]  05/16/2016  10:18 AM

## 2016-05-16 NOTE — Telephone Encounter (Signed)
Received phone call  From Lattie Haw ,RADIOLOGY  states order is needed for patient to have CT angio chest instead of VQ SCAN. Conversation was between  Dr Oval Linsey and [facility] ( radiology. Lattie Haw states patient is on her way to office but patient can come back to [place] . They will be able to do the test today. Order placed by [employer]

## 2016-05-18 ENCOUNTER — Ambulatory Visit (HOSPITAL_COMMUNITY): Payer: Medicaid Other

## 2016-05-20 ENCOUNTER — Ambulatory Visit (HOSPITAL_COMMUNITY)
Admission: RE | Admit: 2016-05-20 | Discharge: 2016-05-20 | Disposition: A | Discharge status: Home / Self Care | Payer: Medicaid Other | Source: Ambulatory Visit | Attending: Cardiovascular Disease | Admitting: Cardiovascular Disease

## 2016-05-20 DIAGNOSIS — R072 Precordial pain: Secondary | ICD-10-CM | POA: Diagnosis present

## 2016-05-20 MED ORDER — IOPAMIDOL (ISOVUE-370) INJECTION 76%
100.0000 mL | Freq: Once | INTRAVENOUS | Status: AC | PRN
  Administered 2016-05-20: 100 mL via INTRAVENOUS

## 2016-05-23 ENCOUNTER — Ambulatory Visit: Payer: Medicaid Other | Admitting: Family Medicine

## 2016-05-23 ENCOUNTER — Telehealth: Payer: Self-pay | Admitting: *Deleted

## 2016-05-23 NOTE — Telephone Encounter (Signed)
Christy [name] missed  A scheduled appointment for postpartum visit. Per chart review has preeclampsia. I called [name] and notified her she missed her appt- she would like it rescheduled. I explained I will forward to registars and they will reschedule her and call her with new appt.

## 2016-05-24 ENCOUNTER — Ambulatory Visit: Payer: Medicaid Other | Admitting: Advanced Practice Midwife

## 2016-05-31 ENCOUNTER — Telehealth (HOSPITAL_COMMUNITY): Payer: Self-pay

## 2016-05-31 NOTE — Telephone Encounter (Signed)
Encounter complete. 

## 2016-06-02 ENCOUNTER — Ambulatory Visit (HOSPITAL_COMMUNITY)
Admission: RE | Admit: 2016-06-02 | Discharge: 2016-06-02 | Disposition: A | Discharge status: Home / Self Care | Payer: Medicaid Other | Source: Ambulatory Visit | Attending: Cardiovascular Disease | Admitting: Cardiovascular Disease

## 2016-06-02 DIAGNOSIS — R079 Chest pain, unspecified: Secondary | ICD-10-CM | POA: Diagnosis not present

## 2016-06-02 LAB — EXERCISE TOLERANCE TEST
CHL CUP MPHR: 186 {beats}/min
CHL CUP RESTING HR STRESS: 77 {beats}/min
CHL CUP STRESS STAGE 2 SPEED: 0 mph
CHL CUP STRESS STAGE 3 DBP: 81 mmHg
CHL CUP STRESS STAGE 3 GRADE: 10 %
CHL CUP STRESS STAGE 3 SBP: 141 mmHg
CHL CUP STRESS STAGE 3 SPEED: 1.7 mph
CHL CUP STRESS STAGE 4 HR: 142 {beats}/min
CHL CUP STRESS STAGE 4 SBP: 168 mmHg
CHL CUP STRESS STAGE 5 DBP: 81 mmHg
CHL CUP STRESS STAGE 5 GRADE: 14 %
CHL CUP STRESS STAGE 5 SBP: 160 mmHg
CHL CUP STRESS STAGE 6 DBP: 47 mmHg
CHL CUP STRESS STAGE 6 GRADE: 0 %
CHL CUP STRESS STAGE 6 HR: 144 {beats}/min
CHL CUP STRESS STAGE 7 DBP: 76 mmHg
CHL CUP STRESS STAGE 7 GRADE: 0 %
CHL RATE OF PERCEIVED EXERTION: 17
CSEPED: 9 min
CSEPEDS: 0 s
CSEPEW: 10.1 METS
CSEPHR: 90 %
CSEPPBP: 160 mmHg
CSEPPHR: 166 {beats}/min
CSEPPMHR: 89 %
Stage 1 DBP: 93 mmHg
Stage 1 Grade: 0 %
Stage 1 HR: 85 {beats}/min
Stage 1 SBP: 129 mmHg
Stage 1 Speed: 0 mph
Stage 2 Grade: 0 %
Stage 2 HR: 85 {beats}/min
Stage 3 HR: 115 {beats}/min
Stage 4 DBP: 82 mmHg
Stage 4 Grade: 12 %
Stage 4 Speed: 2.5 mph
Stage 5 HR: 166 {beats}/min
Stage 5 Speed: 3.4 mph
Stage 6 SBP: 147 mmHg
Stage 6 Speed: 0 mph
Stage 7 HR: 108 {beats}/min
Stage 7 SBP: 142 mmHg
Stage 7 Speed: 0 mph

## 2016-06-13 ENCOUNTER — Encounter: Payer: Self-pay | Admitting: *Deleted

## 2016-06-13 ENCOUNTER — Ambulatory Visit: Payer: Medicaid Other | Admitting: Cardiovascular Disease

## 2016-06-13 NOTE — Progress Notes (Deleted)
Cardiology Office Note   Date:  06/13/2016   ID:  Christy [name], DOB 1981-07-21, MRN [id number]  PCP:  Default, Provider, MD  Cardiologist:   Skeet Latch, MD   No chief complaint on file.     History of Present Illness: Christy [name] is a 35 y.o. female who presents for follow up.  Christy [name] delivered a baby at 74 weeks 03/2016.  She was induced early due to preeclampsia.  During her pregnancy she developed 5/10 discomfort under her left breast that felt like a gas bubble.  The pain persisted after delivery so she was referred for an ETT 05/2016 that was negative for ischemia. In her clinic appointment she was also noted to be mildly hypoxic with an oxygen saturation of 91%. She had a CT of the chest that was negative for PE.    Past Medical History:  Diagnosis Date  . Anemia   . Pregnancy induced hypertension     Past Surgical History:  Procedure Laterality Date  . IUD REMOVAL    . NO PAST SURGERIES       Current Outpatient Prescriptions  Medication Sig Dispense Refill  . acetaminophen (TYLENOL) 500 MG tablet Take 500 mg by mouth every 6 (six) hours as needed for headache.    . calcium carbonate (TUMS - DOSED IN MG ELEMENTAL CALCIUM) 500 MG chewable tablet Chew 2 tablets by mouth 2 (two) times daily as needed for indigestion or heartburn.    . cyclobenzaprine (FLEXERIL) 10 MG tablet Take 1 tablet (10 mg total) by mouth 2 (two) times daily as needed for muscle spasms. 20 tablet 0  . diphenhydramine-acetaminophen (TYLENOL PM) 25-500 MG TABS tablet Take 1 tablet by mouth at bedtime as needed (for sleep).    Marland Kitchen ibuprofen (ADVIL,MOTRIN) 600 MG tablet Take 1 tablet (600 mg total) by mouth every 6 (six) hours. 30 tablet 0  . Prenatal Multivit-Min-Fe-FA (PRENATAL VITAMINS) 0.8 MG tablet Take 1 tablet by mouth daily. (Patient taking differently: Take 1 tablet by mouth daily. ) 30 tablet 12  . senna-docusate (SENOKOT-S) 8.6-50 MG tablet Take 2 tablets by mouth daily. 30  tablet 0   No current facility-administered medications for this visit.     Allergies:   Diflucan [fluconazole]; Sulfamethoxazole-trimethoprim; Latex; and Metronidazole    Social History:  The patient  reports that she has never smoked. She has never used smokeless tobacco. She reports that she does not drink alcohol or use drugs.   Family History:  The patient's family history includes Heart attack in her father; Hypertension in her father, maternal grandmother, mother, and sister; Stroke in her father.    ROS:  Please see the history of present illness.   Otherwise, review of systems are positive for none.   All other systems are reviewed and negative.    PHYSICAL EXAM: VS:  There were no vitals taken for this visit. , BMI There is no height or weight on file to calculate BMI. GENERAL:  Well appearing HEENT:  Pupils equal round and reactive, fundi not visualized, oral mucosa unremarkable NECK:  No jugular venous distention, waveform within normal limits, carotid upstroke brisk and symmetric, no bruits, no thyromegaly LYMPHATICS:  No cervical adenopathy LUNGS:  Clear to auscultation bilaterally HEART:  RRR.  PMI not displaced or sustained,S1 and S2 within normal limits, no S3, no S4, no clicks, no rubs, no murmurs ABD:  Flat, positive bowel sounds normal in frequency in pitch, no bruits, no rebound, no guarding, no midline  pulsatile mass, no hepatomegaly, no splenomegaly EXT:  2 plus pulses throughout, no edema, no cyanosis no clubbing SKIN:  No rashes no nodules NEURO:  Cranial nerves II through XII grossly intact, motor grossly intact throughout PSYCH:  Cognitively intact, oriented to person place and time    EKG:  EKG is ordered today. The ekg ordered today demonstrates sinus rhythm rate 71 bpm.  Low voltage.    ETT 06/02/16:  Blood pressure demonstrated a normal response to exercise.  There was no ST segment deviation noted during stress.   ETT with good exercise  tolerance (9:00); no chest pain; normal BP response; no ST changes; negative adequate ETT; [facility] treadmill score 9.   Echo 07/31/14: Study Conclusions  - Left ventricle: The cavity size was normal. Wall thickness was increased in a pattern of mild LVH. Systolic function was normal. The estimated ejection fraction was in the range of 55% to 60%. Wall motion was normal; there were no regional wall motion abnormalities. Doppler parameters are consistent with abnormal left ventricular relaxation (grade 1 diastolic dysfunction). - Aorta: There was mild dilation of the right coronary sinus(es) of Valsalva.  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  Recent Labs: 03/29/2016: ALT 17; BUN 12; Creatinine, Ser 0.80; Potassium 3.6; Sodium 137 03/30/2016: Hemoglobin 11.0; Magnesium 5.2; Platelets 241    Lipid Panel No results found for: CHOL, TRIG, HDL, CHOLHDL, VLDL, LDLCALC, LDLDIRECT    Wt Readings from Last 3 Encounters:  05/09/16 86.4 kg (190 lb 6.4 oz)  04/08/16 83.9 kg (185 lb)  03/29/16 95.3 kg (210 lb)      ASSESSMENT AND PLAN:  # Atypical chest pain: Christy [name] chest pain is very atypical.  However, there is an exertional component and she has a known coronary artery aneurysm.  We will get an ETT to evaluate for ischemia.  She is also mildly hypoxic with an oxygen saturation of 91%.  Given her recent delivery she is at increased risk for DVT/PE.  We will get a V/Q scan.  # Pre-eclampsia: Blood pressure has returned to baseline.  We discussed the fact that she is at risk for developing essential hypertension given that her blood pressure was elevated in pregnancy and because of her family history.  We discussed the importance of limiting salt intake and regular exercise.    Current medicines are reviewed at length with the patient today.  The patient does  not have concerns regarding medicines.  The following changes have been made:  no change  Labs/ tests ordered today include:   No orders of the defined types were placed in this encounter.    Disposition:   FU with Christy Verga C. Oval Linsey, MD, [facility] in 1 month    This note was written with the assistance of speech recognition software.  Please excuse any transcriptional errors.  Signed, Brandt Chaney C. Oval Linsey, MD, [facility]  06/13/2016 8:21 AM    [place]

## 2016-09-04 ENCOUNTER — Inpatient Hospital Stay (HOSPITAL_COMMUNITY)
Admission: AD | Admit: 2016-09-04 | Discharge: 2016-09-04 | Disposition: A | Discharge status: Home / Self Care | Payer: Medicaid Other | Source: Ambulatory Visit | Attending: Obstetrics & Gynecology | Admitting: Obstetrics & Gynecology

## 2016-09-04 DIAGNOSIS — Z91040 Latex allergy status: Secondary | ICD-10-CM | POA: Diagnosis not present

## 2016-09-04 DIAGNOSIS — Z882 Allergy status to sulfonamides status: Secondary | ICD-10-CM | POA: Insufficient documentation

## 2016-09-04 DIAGNOSIS — Z91048 Other nonmedicinal substance allergy status: Secondary | ICD-10-CM | POA: Diagnosis not present

## 2016-09-04 DIAGNOSIS — Z8249 Family history of ischemic heart disease and other diseases of the circulatory system: Secondary | ICD-10-CM | POA: Insufficient documentation

## 2016-09-04 DIAGNOSIS — R300 Dysuria: Secondary | ICD-10-CM

## 2016-09-04 DIAGNOSIS — N3001 Acute cystitis with hematuria: Secondary | ICD-10-CM

## 2016-09-04 DIAGNOSIS — N390 Urinary tract infection, site not specified: Secondary | ICD-10-CM | POA: Diagnosis present

## 2016-09-04 DIAGNOSIS — D649 Anemia, unspecified: Secondary | ICD-10-CM | POA: Insufficient documentation

## 2016-09-04 DIAGNOSIS — Z3202 Encounter for pregnancy test, result negative: Secondary | ICD-10-CM | POA: Diagnosis not present

## 2016-09-04 LAB — URINALYSIS, ROUTINE W REFLEX MICROSCOPIC
Bilirubin Urine: NEGATIVE
GLUCOSE, UA: NEGATIVE mg/dL
Ketones, ur: NEGATIVE mg/dL
NITRITE: POSITIVE — AB
PH: 5 (ref 5.0–8.0)
PROTEIN: NEGATIVE mg/dL
SPECIFIC GRAVITY, URINE: 1.025 (ref 1.005–1.030)

## 2016-09-04 LAB — POCT PREGNANCY, URINE: Preg Test, Ur: NEGATIVE

## 2016-09-04 MED ORDER — NITROFURANTOIN MONOHYD MACRO 100 MG PO CAPS: 100.0000 mg | ORAL_CAPSULE | Freq: Two times a day (BID) | ORAL | 0 refills | Status: DC

## 2016-09-04 MED ORDER — NITROFURANTOIN MONOHYD MACRO 100 MG PO CAPS: 100.0000 mg | ORAL_CAPSULE | Freq: Two times a day (BID) | ORAL | 0 refills | Status: AC

## 2016-09-04 NOTE — MAU Note (Signed)
Noticed a spasm and burning and pain with urination.  Feels like she had a UTI.  Noticed a foul odor with her urination.

## 2016-09-04 NOTE — MAU Provider Note (Signed)
History     CSN: [id number]  Arrival date and time: 09/04/16 [id number]   First Provider Initiated Contact with Patient 09/04/16 [phone]      Chief Complaint  Patient presents with  . Urinary Tract Infection   HPI Christy [name] 36 y.o.  Comes to MAU with symptoms of UTI x 2 days.  Has a history of multiple UTIs since age 34.  Began having odor with her urine, cloudy urine and burning 2 days ago and it has worsened over the past 2 days.  Took Azo in the past 24 hours to be able to handle the pain until today.  Has 7 children at home and is not stay to wait for results or have any further evaluation.  Is very convinced this is a UTI.  Is currently breastfeeding and has noticed her milk supply is down.  Has been taking Fenugreek.   OB History    Gravida Para Term Preterm AB Living   8 7 6 1 1 7    SAB TAB Ectopic Multiple Live Births   1 0 0 0 7      Past Medical History:  Diagnosis Date  . Anemia   . Pregnancy induced hypertension     Past Surgical History:  Procedure Laterality Date  . IUD REMOVAL    . NO PAST SURGERIES      Family History  Problem Relation Age of Onset  . Hypertension Mother   . Hypertension Father   . Heart attack Father   . Stroke Father   . Hypertension Sister   . Hypertension Maternal Grandmother     Social History  Substance Use Topics  . Smoking status: Never Smoker  . Smokeless tobacco: Never Used  . Alcohol use No    Allergies:  Allergies  Allergen Reactions  . Diflucan [Fluconazole]     Mouth and vagina area breaks out into hives  . Sulfamethoxazole-Trimethoprim Hives  . Latex Rash  . Metronidazole Rash    If takes longer than 5 days will develop a rash    Prescriptions Prior to Admission  Medication Sig Dispense Refill Last Dose  . acetaminophen (TYLENOL) 500 MG tablet Take 500 mg by mouth every 6 (six) hours as needed for headache.   Taking  . calcium carbonate (TUMS - DOSED IN MG ELEMENTAL CALCIUM) 500 MG chewable tablet Chew 2  tablets by mouth 2 (two) times daily as needed for indigestion or heartburn.   Taking  . cyclobenzaprine (FLEXERIL) 10 MG tablet Take 1 tablet (10 mg total) by mouth 2 (two) times daily as needed for muscle spasms. 20 tablet 0 Taking  . diphenhydramine-acetaminophen (TYLENOL PM) 25-500 MG TABS tablet Take 1 tablet by mouth at bedtime as needed (for sleep).   Taking  . ibuprofen (ADVIL,MOTRIN) 600 MG tablet Take 1 tablet (600 mg total) by mouth every 6 (six) hours. 30 tablet 0 Taking  . Prenatal Multivit-Min-Fe-FA (PRENATAL VITAMINS) 0.8 MG tablet Take 1 tablet by mouth daily. (Patient taking differently: Take 1 tablet by mouth daily. ) 30 tablet 12 Taking  . senna-docusate (SENOKOT-S) 8.6-50 MG tablet Take 2 tablets by mouth daily. 30 tablet 0 Taking    Review of Systems  Constitutional: Negative for fever.  Gastrointestinal: Negative for abdominal pain, nausea and vomiting.  Genitourinary: Positive for dysuria. Negative for flank pain.       Urine has odor and is cloudy Has burning right at urethra and slight bladder spasm after urinating.  Musculoskeletal: Negative for back pain.  Physical Exam   Blood pressure 131/87, pulse 88, temperature 97.9 F (36.6 C), temperature source Oral, resp. rate 16, SpO2 99 %, unknown if currently breastfeeding.  Physical Exam  Nursing note and vitals reviewed. Constitutional: She is oriented to person, place, and time. She appears well-developed and well-nourished.  HENT:  Head: Normocephalic.  Eyes: EOM are normal.  Neck: Neck supple.  Musculoskeletal: Normal range of motion.  No CVA tenderness  Neurological: She is alert and oriented to person, place, and time.  Skin: Skin is warm and dry.  Psychiatric: She has a normal mood and affect.    MAU Course  Procedures  Results for orders placed or performed during the hospital encounter of 09/04/16 (from the past 24 hour(s))  Urinalysis, Routine w reflex microscopic     Status: Abnormal    Collection Time: 09/04/16  9:12 AM  Result Value Ref Range   Color, Urine AMBER (A) YELLOW   APPearance HAZY (A) CLEAR   Specific Gravity, Urine 1.025 1.005 - 1.030   pH 5.0 5.0 - 8.0   Glucose, UA NEGATIVE NEGATIVE mg/dL   Hgb urine dipstick SMALL (A) NEGATIVE   Bilirubin Urine NEGATIVE NEGATIVE   Ketones, ur NEGATIVE NEGATIVE mg/dL   Protein, ur NEGATIVE NEGATIVE mg/dL   Nitrite POSITIVE (A) NEGATIVE   Leukocytes, UA MODERATE (A) NEGATIVE   RBC / HPF 0-5 0 - 5 RBC/hpf   WBC, UA TOO NUMEROUS TO COUNT 0 - 5 WBC/hpf   Bacteria, UA MANY (A) NONE SEEN   Squamous Epithelial / LPF 0-5 (A) NONE SEEN   WBC Clumps PRESENT    Mucous PRESENT   Pregnancy, urine POC     Status: None   Collection Time: 09/04/16  9:21 AM  Result Value Ref Range   Preg Test, Ur NEGATIVE NEGATIVE   MDM   Assessment and Plan  UTI  Plan Culture pending. Called patient with results - will pick up her medication soon today and take 2 doses today. Eprescribed medication to her pharmacy Advised to seek additional medical care if she develops fever, back pain, body aches, or vomiting. Urine culture pending. Drink 8 glasses of water daily.   Terri L Burleson 09/04/2016, 9:25 AM

## 2016-09-04 NOTE — Progress Notes (Signed)
Pt could not stay for results she has seven kids at home and her husband had to leave for work so she left a number to call for results.  She was seen in triage after she left her urine sample.

## 2016-09-04 NOTE — Progress Notes (Signed)
Number to call for results: [phone].

## 2016-09-06 LAB — URINE CULTURE: Culture: >=100000 — AB

## 2016-09-24 ENCOUNTER — Encounter (HOSPITAL_COMMUNITY): Payer: Self-pay | Admitting: *Deleted

## 2016-09-24 ENCOUNTER — Inpatient Hospital Stay (HOSPITAL_COMMUNITY)
Admission: AD | Admit: 2016-09-24 | Discharge: 2016-09-24 | Disposition: A | Discharge status: Home / Self Care | Payer: Medicaid Other | Source: Ambulatory Visit | Attending: Obstetrics and Gynecology | Admitting: Obstetrics and Gynecology

## 2016-09-24 DIAGNOSIS — Z3202 Encounter for pregnancy test, result negative: Secondary | ICD-10-CM | POA: Diagnosis not present

## 2016-09-24 DIAGNOSIS — N390 Urinary tract infection, site not specified: Secondary | ICD-10-CM | POA: Diagnosis not present

## 2016-09-24 DIAGNOSIS — N3001 Acute cystitis with hematuria: Secondary | ICD-10-CM | POA: Diagnosis not present

## 2016-09-24 DIAGNOSIS — R300 Dysuria: Secondary | ICD-10-CM | POA: Diagnosis present

## 2016-09-24 LAB — POCT PREGNANCY, URINE: PREG TEST UR: NEGATIVE

## 2016-09-24 LAB — URINALYSIS, ROUTINE W REFLEX MICROSCOPIC
BILIRUBIN URINE: NEGATIVE
Glucose, UA: NEGATIVE mg/dL
Ketones, ur: NEGATIVE mg/dL
Nitrite: NEGATIVE
PROTEIN: NEGATIVE mg/dL
Specific Gravity, Urine: 1.02 (ref 1.005–1.030)
pH: 5.5 (ref 5.0–8.0)

## 2016-09-24 LAB — URINALYSIS, MICROSCOPIC (REFLEX)

## 2016-09-24 MED ORDER — PHENAZOPYRIDINE HCL 200 MG PO TABS: 200.0000 mg | ORAL_TABLET | Freq: Three times a day (TID) | ORAL | 0 refills | Status: DC

## 2016-09-24 MED ORDER — CIPROFLOXACIN HCL 250 MG PO TABS: 250.0000 mg | ORAL_TABLET | Freq: Two times a day (BID) | ORAL | 0 refills | Status: DC

## 2016-09-24 NOTE — MAU Note (Signed)
Having burning and discomfort with urination. Frequency and urgency, urine has an odor.  When completed last round of antibiotics- still had a slight "tingle" when urinated, doesn't think it went away completely.  Denies fever or back pain

## 2016-09-24 NOTE — Discharge Instructions (Signed)
Urinary Tract Infection, Adult Introduction A urinary tract infection (UTI) is an infection of any part of the urinary tract. The urinary tract includes the:  Kidneys.  Ureters.  Bladder.  Urethra. These organs make, store, and get rid of pee (urine) in the body. Follow these instructions at home:  Take over-the-counter and prescription medicines only as told by your doctor.  If you were prescribed an antibiotic medicine, take it as told by your doctor. Do not stop taking the antibiotic even if you start to feel better.  Avoid the following drinks:  Alcohol.  Caffeine.  Tea.  Carbonated drinks.  Drink enough fluid to keep your pee clear or pale yellow.  Keep all follow-up visits as told by your doctor. This is important.  Make sure to:  Empty your bladder often and completely. Do not to hold pee for long periods of time.  Empty your bladder before and after sex.  Wipe from front to back after a bowel movement if you are female. Use each tissue one time when you wipe. Contact a doctor if:  You have back pain.  You have a fever.  You feel sick to your stomach (nauseous).  You throw up (vomit).  Your symptoms do not get better after 3 days.  Your symptoms go away and then come back. Get help right away if:  You have very bad back pain.  You have very bad lower belly (abdominal) pain.  You are throwing up and cannot keep down any medicines or water. This information is not intended to replace advice given to you by your health care provider. Make sure you discuss any questions you have with your health care provider. Document Released: 01/11/2008 Document Revised: 12/31/2015 Document Reviewed: 06/15/2015  2017 Elsevier  

## 2016-09-24 NOTE — MAU Provider Note (Signed)
History     CSN: [id number]  Arrival date and time: 09/24/16 [id number]   First Provider Initiated Contact with Patient 09/24/16 [phone]      Chief Complaint  Patient presents with  . Dysuria   HPI [name] is a 36 y.o. [id number] who presents to MAU today with complaint of dysuria. The patient was treated for a UTI at the end of January. She states that symptoms did not totally resolve, but returned worse 2-3 days ago. She denies frequency, urgency, pelvic pain, flank pain, vaginal bleeding, discharge or fever.   OB History    Gravida Para Term Preterm AB Living   8 7 6 1 1 7    SAB TAB Ectopic Multiple Live Births   1 0 0 0 7      Past Medical History:  Diagnosis Date  . Anemia   . Pregnancy induced hypertension     Past Surgical History:  Procedure Laterality Date  . IUD REMOVAL    . NO PAST SURGERIES      Family History  Problem Relation Age of Onset  . Hypertension Mother   . Hypertension Father   . Heart attack Father   . Stroke Father   . Hypertension Sister   . Hypertension Maternal Grandmother     Social History  Substance Use Topics  . Smoking status: Never Smoker  . Smokeless tobacco: Never Used  . Alcohol use No    Allergies:  Allergies  Allergen Reactions  . Diflucan [Fluconazole]     Mouth and vagina area breaks out into hives  . Sulfamethoxazole-Trimethoprim Hives  . Latex Rash  . Metronidazole Rash    If takes longer than 5 days will develop a rash    No prescriptions prior to admission.    Review of Systems  Constitutional: Negative for fever.  Gastrointestinal: Negative for abdominal pain, constipation, diarrhea, nausea and vomiting.  Genitourinary: Positive for dysuria. Negative for flank pain, frequency, urgency, vaginal bleeding and vaginal discharge.   Physical Exam   Blood pressure 130/65, pulse 84, temperature 99.6 F (37.6 C), temperature source Oral, resp. rate 16, last menstrual period 08/24/2016, unknown if  currently breastfeeding.  Physical Exam  Nursing note and vitals reviewed. Constitutional: She is oriented to person, place, and time. She appears well-developed and well-nourished. No distress.  HENT:  Head: Normocephalic and atraumatic.  Cardiovascular: Normal rate.   Respiratory: Effort normal.  GI: Soft. She exhibits no distension and no mass. There is no tenderness. There is no rebound, no guarding and no CVA tenderness.  Neurological: She is alert and oriented to person, place, and time.  Skin: Skin is warm and dry. No erythema.  Psychiatric: She has a normal mood and affect.    Results for orders placed or performed during the hospital encounter of 09/24/16 (from the past 24 hour(s))  Urinalysis, Routine w reflex microscopic     Status: Abnormal   Collection Time: 09/24/16  8:00 AM  Result Value Ref Range   Color, Urine YELLOW YELLOW   APPearance CLEAR CLEAR   Specific Gravity, Urine 1.020 1.005 - 1.030   pH 5.5 5.0 - 8.0   Glucose, UA NEGATIVE NEGATIVE mg/dL   Hgb urine dipstick MODERATE (A) NEGATIVE   Bilirubin Urine NEGATIVE NEGATIVE   Ketones, ur NEGATIVE NEGATIVE mg/dL   Protein, ur NEGATIVE NEGATIVE mg/dL   Nitrite NEGATIVE NEGATIVE   Leukocytes, UA TRACE (A) NEGATIVE  Urinalysis, Microscopic (reflex)     Status: Abnormal   Collection  Time: 09/24/16  8:00 AM  Result Value Ref Range   RBC / HPF 0-5 0 - 5 RBC/hpf   WBC, UA 0-5 0 - 5 WBC/hpf   Bacteria, UA FEW (A) NONE SEEN   Squamous Epithelial / LPF 0-5 (A) NONE SEEN    MAU Course  Procedures None  MDM UPT - negative UA today  Urine culture ordered. Will treat given patient's symptoms.  Assessment and Plan  A: UTI  P:  Discharge home Rx for Cipro and Pyridium sent to patient's pharmacy  Warning signs for pyelonephritis discussed Patient advised to follow-up with [facility] if symptoms persist or worsen Patient may return to MAU as needed or if her condition were to change or worsen  Luvenia Redden,  PA-C  09/24/2016, 9:40 AM

## 2016-09-27 LAB — URINE CULTURE: Culture: >=100000 — AB

## 2017-02-15 ENCOUNTER — Ambulatory Visit (INDEPENDENT_AMBULATORY_CARE_PROVIDER_SITE_OTHER): Payer: Medicaid Other | Admitting: Advanced Practice Midwife

## 2017-02-15 ENCOUNTER — Encounter: Payer: Self-pay | Admitting: Obstetrics & Gynecology

## 2017-02-15 VITALS — BP 120/57 | HR 82 | Ht 64.0 in | Wt 200.6 lb

## 2017-02-15 DIAGNOSIS — O99891 Other specified diseases and conditions complicating pregnancy: Secondary | ICD-10-CM

## 2017-02-15 DIAGNOSIS — N912 Amenorrhea, unspecified: Secondary | ICD-10-CM

## 2017-02-15 DIAGNOSIS — Z331 Pregnant state, incidental: Secondary | ICD-10-CM

## 2017-02-15 DIAGNOSIS — Z20828 Contact with and (suspected) exposure to other viral communicable diseases: Secondary | ICD-10-CM | POA: Diagnosis not present

## 2017-02-15 DIAGNOSIS — Z20821 Contact with and (suspected) exposure to Zika virus: Secondary | ICD-10-CM

## 2017-02-15 DIAGNOSIS — O9989 Other specified diseases and conditions complicating pregnancy, childbirth and the puerperium: Secondary | ICD-10-CM

## 2017-02-15 LAB — POCT PREGNANCY, URINE: PREG TEST UR: POSITIVE — AB

## 2017-02-15 NOTE — Patient Instructions (Signed)
Pregnancy and Zika Virus Disease Zika virus disease, or Zika, is an illness that can spread to people from mosquitoes that carry the virus. It may also spread from person to person through infected body fluids. Zika first occurred in [place], but recently it has spread to new areas. The virus occurs in tropical climates. The location of Zika continues to change. Most people who become infected with Zika virus do not develop serious illness. However, Zika may cause birth defects in an unborn baby whose mother is infected with the virus. It may also increase the risk of miscarriage. What are the symptoms of Zika virus disease? In many cases, people who have been infected with Zika virus do not develop any symptoms. If symptoms appear, they usually start about a week after the person is infected. Symptoms are usually mild. They may include:  Fever.  Rash.  Red eyes.  Joint pain.  How does Zika virus disease spread? The main way that Zika virus spreads is through the bite of a certain type of mosquito. Unlike most types of mosquitos, which bite only at night, the type of mosquito that carries Zika virus bites both at night and during the day. Zika virus can also spread through sexual contact, through a blood transfusion, and from a mother to her baby before or during birth. Once you have had Zika virus disease, it is unlikely that you will get it again. Can I pass Zika to my baby during pregnancy? Yes, Zika can pass from a mother to her baby before or during birth. What problems can Zika cause for my baby? A woman who is infected with Zika virus while pregnant is at risk of having her baby born with a condition in which the brain or head is smaller than expected (microcephaly). Babies who have microcephaly can have developmental delays, seizures, hearing problems, and vision problems. Having Zika virus disease during pregnancy can also increase the risk of miscarriage. How can Zika virus disease be  prevented? There is no vaccine to prevent Zika. The best way to prevent the disease is to avoid infected mosquitoes and avoid exposure to body fluids that can spread the virus. Avoid any possible exposure to Zika by taking the following precautions. For women and their sex partners:  Avoid traveling to high-risk areas. The locations where Zika is being reported change often. To identify high-risk areas, check the CDC travel website: www.cdc.gov/zika/geo/index.html  If you or your sex partner must travel to a high-risk area, talk with a health care provider before and after traveling.  Take all precautions to avoid mosquito bites if you live in, or travel to, any of the high-risk areas. Insect repellents are safe to use during pregnancy.  Ask your health care provider when it is safe to have sexual contact.  For women:  If you are pregnant or trying to become pregnant, avoid sexual contact with persons who may have been exposed to Zika virus, persons who have possible symptoms of Zika, or persons whose history you are unsure about. If you choose to have sexual contact with someone who may have been exposed to Zika virus, use condoms correctly during the entire duration of sexual activity, every time. Do not share sexual devices, as you may be exposed to body fluids.  Ask your health care provider about when it is safe to attempt pregnancy after a possible exposure to Zika virus.  What steps should I take to avoid mosquito bites? Take these steps to avoid mosquito bites   when you are in a high-risk area:  Wear loose clothing that covers your arms and legs.  Limit your outdoor activities.  Do not open windows unless they have window screens.  Sleep under mosquito nets.  Use insect repellent. The best insect repellents have:  DEET, picaridin, oil of lemon eucalyptus (OLE), or IR3535 in them.  Higher amounts of an active ingredient in them.  Remember that insect repellents are safe to  use during pregnancy.  Do not use OLE on children who are younger than 34 years of age. Do not use insect repellent on babies who are younger than 84 months of age.  Cover your child's stroller with mosquito netting. Make sure the netting fits snugly and that any loose netting does not cover your child's mouth or nose. Do not use a blanket as a mosquito-protection cover.  Do not apply insect repellent underneath clothing.  If you are using sunscreen, apply the sunscreen before applying the insect repellent.  Treat clothing with permethrin. Do not apply permethrin directly to your skin. Follow label directions for safe use.  Get rid of standing water, where mosquitoes may reproduce. Standing water is often found in items such as buckets, bowls, animal food dishes, and flowerpots.  When you return from traveling to any high-risk area, continue taking actions to protect yourself against mosquito bites for 3 weeks, even if you show no signs of illness. This will prevent spreading Zika virus to uninfected mosquitoes. What should I know about the sexual transmission of Zika? People can spread Zika to their sexual partners during vaginal, anal, or oral sex, or by sharing sexual devices. Many people with [place] do not develop symptoms, so a person could spread the disease without knowing that they are infected. The greatest risk is to women who are pregnant or who may become pregnant. Zika virus can live longer in semen than it can live in blood. Couples can prevent sexual transmission of the virus by:  Using condoms correctly during the entire duration of sexual activity, every time. This includes vaginal, anal, and oral sex.  Not sharing sexual devices. Sharing increases your risk of being exposed to body fluid from another person.  Avoiding all sexual activity until your health care provider says it is safe.  Should I be tested for Zika virus? A sample of your blood can be tested for Zika virus. A  pregnant woman should be tested if she may have been exposed to the virus or if she has symptoms of Zika. She may also have additional tests done during her pregnancy, such ultrasound testing. Talk with your health care provider about which tests are recommended. This information is not intended to replace advice given to you by your health care provider. Make sure you discuss any questions you have with your health care provider. Document Released: 04/15/2015 Document Revised: 12/31/2015 Document Reviewed: 04/08/2015 Elsevier Interactive Patient Education  [employer].

## 2017-02-15 NOTE — Progress Notes (Signed)
Subjective:     Patient ID: Christy [name], female   DOB: [birth year], 36 y.o.   MRN: [id number]  Christy [name] is a 36 y.o. [id number] who presents today with missed menstrual period. She reports that her LMP was 12/10/16, and she took plan B on 12/20/16. She as in [place] between 6/12-6/19. She is concerned about pregnancy and possible Zika exposure.      Review of Systems  Constitutional: Negative for chills and fever.  Gastrointestinal: Positive for diarrhea ("loose stools" Mostly resolved at this time. ). Negative for nausea and vomiting.  Genitourinary: Negative for dysuria, pelvic pain and vaginal bleeding.       Objective:   Physical Exam  Constitutional: She is oriented to person, place, and time. She appears well-developed and well-nourished. No distress.  Cardiovascular: Normal rate.   Pulmonary/Chest: Effort normal.  Neurological: She is oriented to person, place, and time.  Skin: Skin is warm and dry.  Psychiatric: She has a normal mood and affect.  Nursing note and vitals reviewed.  Results for orders placed or performed in visit on 02/15/17 (from the past 24 hour(s))  Pregnancy, urine POC     Status: Abnormal   Collection Time: 02/15/17  4:03 PM  Result Value Ref Range   Preg Test, Ur POSITIVE (A) NEGATIVE       Assessment:     1. Pregnant state, incidental   2. 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 Will do this today, and then if she continues the pregnancy will need 2 more times during pregnancy.  Advised, no unprotected intercourse with husband during pregnancy.    FU in 4 weeks with new OB visit.  Marcille Buffy 4:25 PM 02/15/17

## 2017-02-22 ENCOUNTER — Ambulatory Visit (HOSPITAL_COMMUNITY)
Admission: RE | Admit: 2017-02-22 | Discharge: 2017-02-22 | Disposition: A | Discharge status: Home / Self Care | Payer: Medicaid Other | Source: Ambulatory Visit | Attending: Advanced Practice Midwife | Admitting: Advanced Practice Midwife

## 2017-02-22 ENCOUNTER — Ambulatory Visit: Payer: Medicaid Other

## 2017-02-22 DIAGNOSIS — Z3A08 8 weeks gestation of pregnancy: Secondary | ICD-10-CM | POA: Diagnosis not present

## 2017-02-22 DIAGNOSIS — Z20821 Contact with and (suspected) exposure to Zika virus: Secondary | ICD-10-CM

## 2017-02-22 DIAGNOSIS — Z712 Person consulting for explanation of examination or test findings: Secondary | ICD-10-CM

## 2017-02-22 DIAGNOSIS — O3411 Maternal care for benign tumor of corpus uteri, first trimester: Secondary | ICD-10-CM | POA: Insufficient documentation

## 2017-02-22 DIAGNOSIS — Z20828 Contact with and (suspected) exposure to other viral communicable diseases: Secondary | ICD-10-CM | POA: Insufficient documentation

## 2017-02-22 DIAGNOSIS — Z331 Pregnant state, incidental: Secondary | ICD-10-CM

## 2017-02-22 DIAGNOSIS — D252 Subserosal leiomyoma of uterus: Secondary | ICD-10-CM | POA: Diagnosis not present

## 2017-02-22 DIAGNOSIS — O9989 Other specified diseases and conditions complicating pregnancy, childbirth and the puerperium: Secondary | ICD-10-CM | POA: Insufficient documentation

## 2017-02-22 LAB — ZIKA VIRUS NAA COMPREHENSIVE
ZIKA VIRUS, NAA, SERUM: NEGATIVE
Zika Virus, NAA, Urine: NEGATIVE

## 2017-02-22 LAB — SPECIMEN STATUS REPORT

## 2017-02-22 NOTE — Progress Notes (Signed)
Patient presented to the office today for ultrasound results. Patient result does state she has a viable pregnancy at this time around 8 weeks and 3 days. Patient was advised to start taking prenatal vitamins at this time. She has been given a letter of verification to apply for medicaid.

## 2017-03-15 ENCOUNTER — Encounter: Payer: Medicaid Other | Admitting: Obstetrics & Gynecology

## 2017-03-15 ENCOUNTER — Encounter: Payer: Self-pay | Admitting: Obstetrics & Gynecology

## 2017-03-15 DIAGNOSIS — O09899 Supervision of other high risk pregnancies, unspecified trimester: Secondary | ICD-10-CM | POA: Insufficient documentation

## 2017-05-01 ENCOUNTER — Ambulatory Visit (INDEPENDENT_AMBULATORY_CARE_PROVIDER_SITE_OTHER): Payer: Medicaid Other | Admitting: Obstetrics & Gynecology

## 2017-05-01 ENCOUNTER — Other Ambulatory Visit (HOSPITAL_COMMUNITY)
Admission: RE | Admit: 2017-05-01 | Discharge: 2017-05-01 | Disposition: A | Discharge status: Home / Self Care | Payer: Medicaid Other | Source: Ambulatory Visit | Attending: Obstetrics & Gynecology | Admitting: Obstetrics & Gynecology

## 2017-05-01 ENCOUNTER — Encounter: Payer: Self-pay | Admitting: Obstetrics & Gynecology

## 2017-05-01 VITALS — BP 136/83 | HR 100 | Wt 198.8 lb

## 2017-05-01 DIAGNOSIS — Z331 Pregnant state, incidental: Secondary | ICD-10-CM | POA: Diagnosis not present

## 2017-05-01 DIAGNOSIS — Z113 Encounter for screening for infections with a predominantly sexual mode of transmission: Secondary | ICD-10-CM | POA: Diagnosis not present

## 2017-05-01 DIAGNOSIS — Z3480 Encounter for supervision of other normal pregnancy, unspecified trimester: Secondary | ICD-10-CM | POA: Insufficient documentation

## 2017-05-01 DIAGNOSIS — Z3482 Encounter for supervision of other normal pregnancy, second trimester: Secondary | ICD-10-CM

## 2017-05-01 DIAGNOSIS — Z3A00 Weeks of gestation of pregnancy not specified: Secondary | ICD-10-CM | POA: Diagnosis not present

## 2017-05-01 DIAGNOSIS — Z8759 Personal history of other complications of pregnancy, childbirth and the puerperium: Secondary | ICD-10-CM

## 2017-05-01 LAB — POCT URINALYSIS DIP (DEVICE)
Bilirubin Urine: NEGATIVE
Glucose, UA: NEGATIVE mg/dL
HGB URINE DIPSTICK: NEGATIVE
Leukocytes, UA: NEGATIVE
NITRITE: NEGATIVE
Protein, ur: 30 mg/dL — AB
Specific Gravity, Urine: 1.025 (ref 1.005–1.030)
Urobilinogen, UA: 1 mg/dL (ref 0.0–1.0)
pH: 6.5 (ref 5.0–8.0)

## 2017-05-01 MED ORDER — ASPIRIN EC 81 MG PO TBEC
81.0000 mg | DELAYED_RELEASE_TABLET | Freq: Every day | ORAL | 1 refills | Status: DC
Start: 2017-05-01 — End: 2017-09-14

## 2017-05-01 NOTE — Patient Instructions (Signed)

## 2017-05-01 NOTE — Progress Notes (Signed)
Subjective:    Christy [name] is a [id number] [redacted]w[redacted]d being seen today for her first obstetrical visit.  Her obstetrical history is significant for history of preeclampsia. Patient does intend to breast feed. Pregnancy history fully reviewed.  Patient reports no complaints.  Vitals:   05/01/17 1337  BP: 136/83  Pulse: 100  Weight: 90.2 kg (198 lb 13 oz)    HISTORY: OB History  Gravida Para Term Preterm AB Living  9 7 6 1 1 7   SAB TAB Ectopic Multiple Live Births  1 0 0 0 7    # Outcome Date GA Lbr Len/2nd Weight Sex Delivery Anes PTL Lv  9 Current           8 Preterm 03/30/16 [redacted]w[redacted]d 05:45 / 00:17 2.065 kg (4 lb 8.8 oz) M Vag-Spont EPI  LIV     Complications: Severe preeclampsia     Birth Comments: extra digits bilateral hands  7 Term 09/17/10 [redacted]w[redacted]d  2.835 kg (6 lb 4 oz) M Vag-Spont EPI N LIV  6 Term 08/17/07 [redacted]w[redacted]d  3.175 kg (7 lb) F Vag-Spont  N LIV  5 Term 11/10/04 [redacted]w[redacted]d  3.402 kg (7 lb 8 oz) M Vag-Spont None N LIV  4 Term 07/23/01 [redacted]w[redacted]d  3.345 kg (7 lb 6 oz) M Vag-Spont None N LIV  3 Term 05/21/99 [redacted]w[redacted]d  3.289 kg (7 lb 4 oz) M Vag-Spont None N LIV  2 Term 05/30/97 [redacted]w[redacted]d  2.948 kg (6 lb 8 oz) F Vag-Spont EPI N LIV  1 SAB             Obstetric Comments  03/30/2016 Induced for severe preeclampsia   Past Medical History:  Diagnosis Date  . Anemia   . Preeclampsia, severe 03/29/2016   Past Surgical History:  Procedure Laterality Date  . NO PAST SURGERIES     Family History  Problem Relation Age of Onset  . Hypertension Mother   . Hypertension Father   . Heart attack Father   . Stroke Father   . Hypertension Sister   . Hypertension Maternal Grandmother      Exam    Uterus:  Fundal Height: 18 cm  Pelvic Exam:    Perineum: Declined pelvic exam                          System: Breast:  normal appearance, no masses or tenderness   Skin: normal coloration and turgor, no rashes    Neurologic: oriented, normal mood   Extremities: normal strength, tone, and  muscle mass   HEENT PERRLA   Mouth/Teeth mucous membranes moist, pharynx normal without lesions   Neck supple and no masses   Cardiovascular: regular rate and rhythm, no murmurs or gallops   Respiratory:  appears well, vitals normal, no respiratory distress, acyanotic, normal RR, chest clear, no wheezing, crepitations, rhonchi, normal symmetric air entry   Abdomen: soft, non-tender; bowel sounds normal; no masses,  no organomegaly     Assessment:    Pregnancy: [id number] Patient Active Problem List   Diagnosis Date Noted  . Supervision of other normal pregnancy, antepartum 05/01/2017  . Short interval between pregnancies affecting pregnancy, antepartum 03/15/2017  . History of severe pre-eclampsia 03/27/2016  . Encounter for supervision of multigravida with advanced maternal age 89/03/2016        Plan:     Initial labs drawn. Prenatal vitamins. Problem list reviewed and updated. Genetic Screening discussed Quad Screen: ordered.  Ultrasound discussed; fetal survey:  ordered.  Follow up in 4 weeks. 50% of 30 min visit spent on counseling and coordination of care.     Emeterio Reeve 05/01/2017

## 2017-05-02 ENCOUNTER — Encounter: Payer: Self-pay | Admitting: *Deleted

## 2017-05-02 LAB — OBSTETRIC PANEL, INCLUDING HIV
Antibody Screen: NEGATIVE
BASOS ABS: 0 10*3/uL (ref 0.0–0.2)
Basos: 1 %
EOS (ABSOLUTE): 0.1 10*3/uL (ref 0.0–0.4)
Eos: 2 %
HEP B S AG: NEGATIVE
HIV SCREEN 4TH GENERATION: NONREACTIVE
Hematocrit: 32.7 % — ABNORMAL LOW (ref 34.0–46.6)
Hemoglobin: 10.4 g/dL — ABNORMAL LOW (ref 11.1–15.9)
Immature Grans (Abs): 0 10*3/uL (ref 0.0–0.1)
Immature Granulocytes: 0 %
LYMPHS ABS: 1.5 10*3/uL (ref 0.7–3.1)
Lymphs: 25 %
MCH: 25.5 pg — AB (ref 26.6–33.0)
MCHC: 31.8 g/dL (ref 31.5–35.7)
MCV: 80 fL (ref 79–97)
MONOS ABS: 0.6 10*3/uL (ref 0.1–0.9)
Monocytes: 9 %
NEUTROS ABS: 3.9 10*3/uL (ref 1.4–7.0)
NEUTROS PCT: 63 %
PLATELETS: 311 10*3/uL (ref 150–379)
RBC: 4.08 x10E6/uL (ref 3.77–5.28)
RDW: 16.1 % — ABNORMAL HIGH (ref 12.3–15.4)
RPR: NONREACTIVE
RUBELLA: 1.4 {index} (ref >0.99)
Rh Factor: POSITIVE
WBC: 6.2 10*3/uL (ref 3.4–10.8)

## 2017-05-02 LAB — HEMOGLOBINOPATHY EVALUATION
HGB A: 97.9 % (ref 96.4–98.8)
HGB C: 0 %
HGB S: 0 %
HGB VARIANT: 0 %
Hemoglobin A2 Quantitation: 2.1 % (ref 1.8–3.2)
Hemoglobin F Quantitation: 0 % (ref 0.0–2.0)

## 2017-05-02 LAB — CERVICOVAGINAL ANCILLARY ONLY
Chlamydia: NEGATIVE
NEISSERIA GONORRHEA: NEGATIVE
Trichomonas: NEGATIVE

## 2017-05-03 LAB — URINE CULTURE, OB REFLEX

## 2017-05-03 LAB — CULTURE, OB URINE

## 2017-05-09 ENCOUNTER — Encounter (HOSPITAL_COMMUNITY): Payer: Self-pay

## 2017-05-09 ENCOUNTER — Ambulatory Visit (HOSPITAL_COMMUNITY)
Admission: RE | Admit: 2017-05-09 | Discharge: 2017-05-09 | Disposition: A | Discharge status: Home / Self Care | Payer: Medicaid Other | Source: Ambulatory Visit | Attending: Obstetrics & Gynecology | Admitting: Obstetrics & Gynecology

## 2017-05-09 ENCOUNTER — Other Ambulatory Visit: Payer: Self-pay | Admitting: Obstetrics & Gynecology

## 2017-05-09 DIAGNOSIS — Z363 Encounter for antenatal screening for malformations: Secondary | ICD-10-CM | POA: Insufficient documentation

## 2017-05-09 DIAGNOSIS — Z8759 Personal history of other complications of pregnancy, childbirth and the puerperium: Secondary | ICD-10-CM

## 2017-05-09 DIAGNOSIS — O09299 Supervision of pregnancy with other poor reproductive or obstetric history, unspecified trimester: Secondary | ICD-10-CM | POA: Diagnosis not present

## 2017-05-09 DIAGNOSIS — O99212 Obesity complicating pregnancy, second trimester: Secondary | ICD-10-CM | POA: Insufficient documentation

## 2017-05-09 DIAGNOSIS — O09892 Supervision of other high risk pregnancies, second trimester: Secondary | ICD-10-CM | POA: Diagnosis not present

## 2017-05-09 DIAGNOSIS — Z3A19 19 weeks gestation of pregnancy: Secondary | ICD-10-CM | POA: Insufficient documentation

## 2017-05-09 DIAGNOSIS — O09522 Supervision of elderly multigravida, second trimester: Secondary | ICD-10-CM | POA: Diagnosis not present

## 2017-05-09 DIAGNOSIS — Z3480 Encounter for supervision of other normal pregnancy, unspecified trimester: Secondary | ICD-10-CM

## 2017-05-10 ENCOUNTER — Other Ambulatory Visit (HOSPITAL_COMMUNITY): Payer: Self-pay | Admitting: *Deleted

## 2017-05-10 DIAGNOSIS — O09299 Supervision of pregnancy with other poor reproductive or obstetric history, unspecified trimester: Secondary | ICD-10-CM

## 2017-05-22 ENCOUNTER — Other Ambulatory Visit: Payer: Self-pay | Admitting: Obstetrics & Gynecology

## 2017-05-24 LAB — AFP TETRA
DIA Mom Value: 0.95
DIA Value (EIA): 136.43 pg/mL
DSR (BY AGE) 1 IN: 250
DSR (Second Trimester) 1 IN: 2018
Gestational Age: 18.1 WEEKS
MSAFP Mom: 1.11
MSAFP: 44.8 ng/mL
MSHCG MOM: 0.96
MSHCG: 21710 m[IU]/mL
Maternal Age At EDD: 35.8 yr
Osb Risk: 10000
TEST RESULTS AFP: NEGATIVE
UE3 VALUE: 1.19 ng/mL
Weight: 198 [lb_av]
uE3 Mom: 1

## 2017-05-24 LAB — SPECIMEN STATUS REPORT

## 2017-05-29 ENCOUNTER — Encounter: Payer: Medicaid Other | Admitting: Obstetrics & Gynecology

## 2017-05-29 ENCOUNTER — Encounter: Payer: Medicaid Other | Admitting: Medical

## 2017-06-05 ENCOUNTER — Ambulatory Visit (INDEPENDENT_AMBULATORY_CARE_PROVIDER_SITE_OTHER): Payer: Medicaid Other | Admitting: Advanced Practice Midwife

## 2017-06-05 VITALS — BP 132/66 | HR 90 | Wt 200.0 lb

## 2017-06-05 DIAGNOSIS — Z8759 Personal history of other complications of pregnancy, childbirth and the puerperium: Secondary | ICD-10-CM

## 2017-06-05 DIAGNOSIS — Z3480 Encounter for supervision of other normal pregnancy, unspecified trimester: Secondary | ICD-10-CM

## 2017-06-05 DIAGNOSIS — O09529 Supervision of elderly multigravida, unspecified trimester: Secondary | ICD-10-CM

## 2017-06-05 DIAGNOSIS — Z3482 Encounter for supervision of other normal pregnancy, second trimester: Secondary | ICD-10-CM

## 2017-06-05 DIAGNOSIS — O09522 Supervision of elderly multigravida, second trimester: Secondary | ICD-10-CM

## 2017-06-05 MED ORDER — NATACHEW 28-1 MG PO CHEW: 1.0000 | CHEWABLE_TABLET | Freq: Every day | ORAL | 11 refills | Status: DC

## 2017-06-05 MED ORDER — FERROUS GLUCONATE 324 (38 FE) MG PO TABS: 324.0000 mg | ORAL_TABLET | Freq: Three times a day (TID) | ORAL | 3 refills | Status: DC

## 2017-06-05 NOTE — Patient Instructions (Signed)
AREA PEDIATRIC/FAMILY PRACTICE PHYSICIANS  [facility] [address], [address] [place], [place]  [address] Phone - [phone]   Fax - [phone]  [facility] [address] [place], [place] [address] Phone - [phone]   Fax - [phone]  [facility] [address] [place], [place]  [address] Phone - [phone]   Fax - [phone]  [facility] [address] [place], [place]  [address] Phone - [phone]   Fax - [phone]  [facility] [address] [place], [place]  [address] Phone - [phone]   Fax - [phone]  [facility] [address] [place], [place]  [address] Phone - [phone]   Fax - [phone]  [facility] [address] [place], [place]  [address] Phone - [phone]   Fax - [phone]  [facility] [address] [place], [place]  [address] Phone - [phone]   Fax - [phone]  [facility] [address] [place], [place]  [address] Phone - [phone]   Fax - [phone] [facility] [address] [place], [place]  [address] Phone - [phone]   Fax - [phone]  [facility] [address] [place], [place]  [address] Phone - [phone]   Fax - [phone]  [facility] [address] [place], [place]  [address] Phone - [phone]   Fax - [phone]  [facility] [address] [place], [place]  [address] Phone - [phone]   Fax - [phone]  [facility] [address] [place], [place]  [address] Phone - [phone]  [facility] [address] [place], [place]  [address] Phone - [phone]   Fax - [phone]  [facility] [address] [place], [place]  [address] Phone - [phone]   Fax - [phone]  [facility] [address] [place], [place]  [address] Phone - [phone]   Fax - [phone]  [facility] [address] [place], [place]  [address] Phone - [phone]   Fax - [phone]   [facility] [address] [place], [place]  [address] Phone - [phone]   Fax - [phone]  [facility] [address] [place], [place]  [address] Phone - [phone]   Fax - [phone]  [facility] [address] [place], [place]  [address] Phone - [phone]   Fax - [phone]  [facility] [address] [place], [place]  [address] Phone - [phone]   Fax - [phone]  [facility] [address] [place], [place]  [address] Phone - [phone]   Fax - [phone] [facility] [address] [place], [place]  [address] Phone - [phone]   Fax - [phone]  [facility] [address] [place], [place]  [address] Phone - [phone]   Fax - [phone]  [facility] [address] [place], [place]  [address] Phone - [phone]   Fax - [phone]  [facility] Charlene Flemming MD [address] [place] [address] Phone [phone]  Fax [phone]   

## 2017-06-05 NOTE — Progress Notes (Signed)
Pt stated having constant headache and requesting to have protein test in the urine

## 2017-06-05 NOTE — Progress Notes (Signed)
   PRENATAL VISIT NOTE  Subjective:  Christy [name] is a 36 y.o. [id number] at [redacted]w[redacted]d being seen today for ongoing prenatal care.  She is currently monitored for the following issues for this high-risk pregnancy and has Encounter for supervision of multigravida with advanced maternal age; History of severe pre-eclampsia; Short interval between pregnancies affecting pregnancy, antepartum; Supervision of other normal pregnancy, antepartum; and History of prior pregnancy with small for gestational age newborn on her problem list.  Patient reports headache.  Contractions: Irregular. Vag. Bleeding: None.  Movement: Present. Denies leaking of fluid.   Patient has had headaches that don't seem to go away with sleep or rest. Would like to have urine checked for protein.   Patient also reports that she feels full very quickly, and she believes this is associated with anemia.   The following portions of the patient's history were reviewed and updated as appropriate: allergies, current medications, past family history, past medical history, past social history, past surgical history and problem list. Problem list updated.  Objective:   Vitals:   06/05/17 1545  BP: 132/66  Pulse: 90  Weight: 200 lb (90.7 kg)    Fetal Status: Fetal Heart Rate (bpm): 152 Fundal Height: 25 cm Movement: Present     General:  Alert, oriented and cooperative. Patient is in no acute distress.  Skin: Skin is warm and dry. No rash noted.   Cardiovascular: Normal heart rate noted  Respiratory: Normal respiratory effort, no problems with respiration noted  Abdomen: Soft, gravid, appropriate for gestational age.  Pain/Pressure: Absent     Pelvic: Cervical exam deferred        Extremities: Normal range of motion.  Edema: None  Mental Status:  Normal mood and affect. Normal behavior. Normal judgment and thought content.   Assessment and Plan:  Pregnancy: [id number] at [redacted]w[redacted]d  1. Supervision of other normal pregnancy,  antepartum  - GTT, tdap at next visit - Patient originally did not want to see midwives for prenatal care. She states that with her last pregnancy she developed pre-eclampsia with severe features, and felt like she was not heard. After today's visit and a long discussion and reassurance she is ok with midwives. She will be sure to communicate if she is feeling like she is not being heard.    2. Encounter for supervision of multigravida with advanced maternal age MFM recc serial growth [place] in 3rd trimester )scheduled for 06/20/17)  3. History of prior pregnancy with small for gestational age newborn - Serial growth   4. Headaches Blood pressure normal today PIH danger signs reviewed   Preterm labor symptoms and general obstetric precautions including but not limited to vaginal bleeding, contractions, leaking of fluid and fetal movement were reviewed in detail with the patient. Please refer to After Visit Summary for other counseling recommendations.  Return in about 4 weeks (around 07/03/2017).   Marcille Buffy, CNM

## 2017-06-20 ENCOUNTER — Encounter (HOSPITAL_COMMUNITY): Payer: Self-pay

## 2017-06-20 ENCOUNTER — Other Ambulatory Visit (HOSPITAL_COMMUNITY): Payer: Self-pay | Admitting: *Deleted

## 2017-06-20 ENCOUNTER — Other Ambulatory Visit (HOSPITAL_COMMUNITY): Payer: Self-pay | Admitting: Maternal and Fetal Medicine

## 2017-06-20 ENCOUNTER — Ambulatory Visit (HOSPITAL_COMMUNITY)
Admission: RE | Admit: 2017-06-20 | Discharge: 2017-06-20 | Disposition: A | Discharge status: Home / Self Care | Payer: Medicaid Other | Source: Ambulatory Visit | Attending: Obstetrics & Gynecology | Admitting: Obstetrics & Gynecology

## 2017-06-20 DIAGNOSIS — O09892 Supervision of other high risk pregnancies, second trimester: Secondary | ICD-10-CM | POA: Diagnosis not present

## 2017-06-20 DIAGNOSIS — O09522 Supervision of elderly multigravida, second trimester: Secondary | ICD-10-CM | POA: Insufficient documentation

## 2017-06-20 DIAGNOSIS — O99212 Obesity complicating pregnancy, second trimester: Secondary | ICD-10-CM | POA: Insufficient documentation

## 2017-06-20 DIAGNOSIS — Z8759 Personal history of other complications of pregnancy, childbirth and the puerperium: Secondary | ICD-10-CM

## 2017-06-20 DIAGNOSIS — Z3A25 25 weeks gestation of pregnancy: Secondary | ICD-10-CM

## 2017-06-20 DIAGNOSIS — O09299 Supervision of pregnancy with other poor reproductive or obstetric history, unspecified trimester: Secondary | ICD-10-CM

## 2017-06-20 DIAGNOSIS — Z362 Encounter for other antenatal screening follow-up: Secondary | ICD-10-CM

## 2017-06-20 DIAGNOSIS — O09292 Supervision of pregnancy with other poor reproductive or obstetric history, second trimester: Secondary | ICD-10-CM | POA: Insufficient documentation

## 2017-07-03 ENCOUNTER — Encounter: Payer: Medicaid Other | Admitting: Advanced Practice Midwife

## 2017-07-10 ENCOUNTER — Encounter: Payer: Medicaid Other | Admitting: Obstetrics & Gynecology

## 2017-07-14 ENCOUNTER — Ambulatory Visit (INDEPENDENT_AMBULATORY_CARE_PROVIDER_SITE_OTHER): Payer: Medicaid Other | Admitting: Advanced Practice Midwife

## 2017-07-14 VITALS — BP 126/63 | HR 93 | Wt 198.2 lb

## 2017-07-14 DIAGNOSIS — O99019 Anemia complicating pregnancy, unspecified trimester: Secondary | ICD-10-CM | POA: Insufficient documentation

## 2017-07-14 DIAGNOSIS — O99013 Anemia complicating pregnancy, third trimester: Secondary | ICD-10-CM | POA: Diagnosis not present

## 2017-07-14 DIAGNOSIS — D649 Anemia, unspecified: Secondary | ICD-10-CM | POA: Diagnosis not present

## 2017-07-14 DIAGNOSIS — Z23 Encounter for immunization: Secondary | ICD-10-CM

## 2017-07-14 DIAGNOSIS — O2613 Low weight gain in pregnancy, third trimester: Secondary | ICD-10-CM | POA: Diagnosis not present

## 2017-07-14 DIAGNOSIS — Z3480 Encounter for supervision of other normal pregnancy, unspecified trimester: Secondary | ICD-10-CM

## 2017-07-14 MED ORDER — CONCEPT OB 130-92.4-1 MG PO CAPS
1.0000 | ORAL_CAPSULE | Freq: Every day | ORAL | 11 refills | Status: DC
Start: 2017-07-14 — End: 2017-07-20

## 2017-07-14 MED ORDER — ENSURE COMPLETE PO LIQD: 237.0000 mL | Freq: Two times a day (BID) | ORAL | 2 refills | Status: DC

## 2017-07-14 NOTE — Patient Instructions (Signed)
Third Trimester of Pregnancy The third trimester is from week 28 through week 40 (months 7 through 9). The third trimester is a time when the unborn baby (fetus) is growing rapidly. At the end of the ninth month, the fetus is about 20 inches in length and weighs 6-10 pounds. Body changes during your third trimester Your body will continue to go through many changes during pregnancy. The changes vary from woman to woman. During the third trimester:  Your weight will continue to increase. You can expect to gain 25-35 pounds (11-16 kg) by the end of the pregnancy.  You may begin to get stretch marks on your hips, abdomen, and breasts.  You may urinate more often because the fetus is moving lower into your pelvis and pressing on your bladder.  You may develop or continue to have heartburn. This is caused by increased hormones that slow down muscles in the digestive tract.  You may develop or continue to have constipation because increased hormones slow digestion and cause the muscles that push waste through your intestines to relax.  You may develop hemorrhoids. These are swollen veins (varicose veins) in the rectum that can itch or be painful.  You may develop swollen, bulging veins (varicose veins) in your legs.  You may have increased body aches in the pelvis, back, or thighs. This is due to weight gain and increased hormones that are relaxing your joints.  You may have changes in your hair. These can include thickening of your hair, rapid growth, and changes in texture. Some women also have hair loss during or after pregnancy, or hair that feels dry or thin. Your hair will most likely return to normal after your baby is born.  Your breasts will continue to grow and they will continue to become tender. A yellow fluid (colostrum) may leak from your breasts. This is the first milk you are producing for your baby.  Your belly button may stick out.  You may notice more swelling in your hands,  face, or ankles.  You may have increased tingling or numbness in your hands, arms, and legs. The skin on your belly may also feel numb.  You may feel short of breath because of your expanding uterus.  You may have more problems sleeping. This can be caused by the size of your belly, increased need to urinate, and an increase in your body's metabolism.  You may notice the fetus "dropping," or moving lower in your abdomen (lightening).  You may have increased vaginal discharge.  You may notice your joints feel loose and you may have pain around your pelvic bone.  What to expect at prenatal visits You will have prenatal exams every 2 weeks until week 36. Then you will have weekly prenatal exams. During a routine prenatal visit:  You will be weighed to make sure you and the baby are growing normally.  Your blood pressure will be taken.  Your abdomen will be measured to track your baby's growth.  The fetal heartbeat will be listened to.  Any test results from the previous visit will be discussed.  You may have a cervical check near your due date to see if your cervix has softened or thinned (effaced).  You will be tested for Group B streptococcus. This happens between 35 and 37 weeks.  Your health care provider may ask you:  What your birth plan is.  How you are feeling.  If you are feeling the baby move.  If you have had   any abnormal symptoms, such as leaking fluid, bleeding, severe headaches, or abdominal cramping.  If you are using any tobacco products, including cigarettes, chewing tobacco, and electronic cigarettes.  If you have any questions.  Other tests or screenings that may be performed during your third trimester include:  Blood tests that check for low iron levels (anemia).  Fetal testing to check the health, activity level, and growth of the fetus. Testing is done if you have certain medical conditions or if there are problems during the  pregnancy.  Nonstress test (NST). This test checks the health of your baby to make sure there are no signs of problems, such as the baby not getting enough oxygen. During this test, a belt is placed around your belly. The baby is made to move, and its heart rate is monitored during movement.  What is false labor? False labor is a condition in which you feel small, irregular tightenings of the muscles in the womb (contractions) that usually go away with rest, changing position, or drinking water. These are called Braxton Hicks contractions. Contractions may last for hours, days, or even weeks before true labor sets in. If contractions come at regular intervals, become more frequent, increase in intensity, or become painful, you should see your health care provider. What are the signs of labor?  Abdominal cramps.  Regular contractions that start at 10 minutes apart and become stronger and more frequent with time.  Contractions that start on the top of the uterus and spread down to the lower abdomen and back.  Increased pelvic pressure and dull back pain.  A watery or bloody mucus discharge that comes from the vagina.  Leaking of amniotic fluid. This is also known as your "water breaking." It could be a slow trickle or a gush. Let your health care provider know if it has a color or strange odor. If you have any of these signs, call your health care provider right away, even if it is before your due date. Follow these instructions at home: Medicines  Follow your health care provider's instructions regarding medicine use. Specific medicines may be either safe or unsafe to take during pregnancy.  Take a prenatal vitamin that contains at least 600 micrograms (mcg) of folic acid.  If you develop constipation, try taking a stool softener if your health care provider approves. Eating and drinking  Eat a balanced diet that includes fresh fruits and vegetables, whole grains, good sources of protein  such as meat, eggs, or tofu, and low-fat dairy. Your health care provider will help you determine the amount of weight gain that is right for you.  Avoid raw meat and uncooked cheese. These carry germs that can cause birth defects in the baby.  If you have low calcium intake from food, talk to your health care provider about whether you should take a daily calcium supplement.  Eat four or five small meals rather than three large meals a day.  Limit foods that are high in fat and processed sugars, such as fried and sweet foods.  To prevent constipation: ? Drink enough fluid to keep your urine clear or pale yellow. ? Eat foods that are high in fiber, such as fresh fruits and vegetables, whole grains, and beans. Activity  Exercise only as directed by your health care provider. Most women can continue their usual exercise routine during pregnancy. Try to exercise for 30 minutes at least 5 days a week. Stop exercising if you experience uterine contractions.  Avoid heavy   lifting.  Do not exercise in extreme heat or humidity, or at high altitudes.  Wear low-heel, comfortable shoes.  Practice good posture.  You may continue to have sex unless your health care provider tells you otherwise. Relieving pain and discomfort  Take frequent breaks and rest with your legs elevated if you have leg cramps or low back pain.  Take warm sitz baths to soothe any pain or discomfort caused by hemorrhoids. Use hemorrhoid cream if your health care provider approves.  Wear a good support bra to prevent discomfort from breast tenderness.  If you develop varicose veins: ? Wear support pantyhose or compression stockings as told by your healthcare provider. ? Elevate your feet for 15 minutes, 3-4 times a day. Prenatal care  Write down your questions. Take them to your prenatal visits.  Keep all your prenatal visits as told by your health care provider. This is important. Safety  Wear your seat belt at  all times when driving.  Make a list of emergency phone numbers, including numbers for family, friends, the hospital, and police and fire departments. General instructions  Avoid cat litter boxes and soil used by cats. These carry germs that can cause birth defects in the baby. If you have a cat, ask someone to clean the litter box for you.  Do not travel far distances unless it is absolutely necessary and only with the approval of your health care provider.  Do not use hot tubs, steam rooms, or saunas.  Do not drink alcohol.  Do not use any products that contain nicotine or tobacco, such as cigarettes and e-cigarettes. If you need help quitting, ask your health care provider.  Do not use any medicinal herbs or unprescribed drugs. These chemicals affect the formation and growth of the baby.  Do not douche or use tampons or scented sanitary pads.  Do not cross your legs for long periods of time.  To prepare for the arrival of your baby: ? Take prenatal classes to understand, practice, and ask questions about labor and delivery. ? Make a trial run to the hospital. ? Visit the hospital and tour the maternity area. ? Arrange for maternity or paternity leave through employers. ? Arrange for family and friends to take care of pets while you are in the hospital. ? Purchase a rear-facing car seat and make sure you know how to install it in your car. ? Pack your hospital bag. ? Prepare the baby's nursery. Make sure to remove all pillows and stuffed animals from the baby's crib to prevent suffocation.  Visit your dentist if you have not gone during your pregnancy. Use a soft toothbrush to brush your teeth and be gentle when you floss. Contact a health care provider if:  You are unsure if you are in labor or if your water has broken.  You become dizzy.  You have mild pelvic cramps, pelvic pressure, or nagging pain in your abdominal area.  You have lower back pain.  You have persistent  nausea, vomiting, or diarrhea.  You have an unusual or bad smelling vaginal discharge.  You have pain when you urinate. Get help right away if:  Your water breaks before 37 weeks.  You have regular contractions less than 5 minutes apart before 37 weeks.  You have a fever.  You are leaking fluid from your vagina.  You have spotting or bleeding from your vagina.  You have severe abdominal pain or cramping.  You have rapid weight loss or weight gain.    You have shortness of breath with chest pain.  You notice sudden or extreme swelling of your face, hands, ankles, feet, or legs.  Your baby makes fewer than 10 movements in 2 hours.  You have severe headaches that do not go away when you take medicine.  You have vision changes. Summary  The third trimester is from week 28 through week 40, months 7 through 9. The third trimester is a time when the unborn baby (fetus) is growing rapidly.  During the third trimester, your discomfort may increase as you and your baby continue to gain weight. You may have abdominal, leg, and back pain, sleeping problems, and an increased need to urinate.  During the third trimester your breasts will keep growing and they will continue to become tender. A yellow fluid (colostrum) may leak from your breasts. This is the first milk you are producing for your baby.  False labor is a condition in which you feel small, irregular tightenings of the muscles in the womb (contractions) that eventually go away. These are called Braxton Hicks contractions. Contractions may last for hours, days, or even weeks before true labor sets in.  Signs of labor can include: abdominal cramps; regular contractions that start at 10 minutes apart and become stronger and more frequent with time; watery or bloody mucus discharge that comes from the vagina; increased pelvic pressure and dull back pain; and leaking of amniotic fluid. This information is not intended to replace advice  given to you by your health care provider. Make sure you discuss any questions you have with your health care provider. Document Released: 07/19/2001 Document Revised: 12/31/2015 Document Reviewed: 09/25/2012 [employer] Interactive Patient Education  2017 [employer]  

## 2017-07-14 NOTE — Progress Notes (Signed)
   PRENATAL VISIT NOTE  Subjective:  Christy [name] is a 36 y.o. [id number] at [redacted]w[redacted]d being seen today for ongoing prenatal care.  She is currently monitored for the following issues for this low-risk pregnancy and has Encounter for supervision of multigravida with advanced maternal age; History of severe pre-eclampsia; Short interval between pregnancies affecting pregnancy, antepartum; Supervision of other normal pregnancy, antepartum; History of prior pregnancy with small for gestational age newborn; and Anemia complicating pregnancy on their problem list.  Patient reports lack of appetite and concern about anemia since she is not tolerating her PO iron due to contipation.  Contractions: Irritability. Vag. Bleeding: None.  Movement: Present. Denies leaking of fluid.   The following portions of the patient's history were reviewed and updated as appropriate: allergies, current medications, past family history, past medical history, past social history, past surgical history and problem list. Problem list updated.  Objective:   Vitals:   07/14/17 0954  BP: 126/63  Pulse: 93  Weight: 198 lb 3.2 oz (89.9 kg)    Fetal Status: Fetal Heart Rate (bpm): 140 Fundal Height: 29 cm Movement: Present     General:  Alert, oriented and cooperative. Patient is in no acute distress.  Skin: Skin is warm and dry. No rash noted.   Cardiovascular: Normal heart rate noted  Respiratory: Normal respiratory effort, no problems with respiration noted  Abdomen: Soft, gravid, appropriate for gestational age.  Pain/Pressure: Present     Pelvic: Cervical exam deferred        Extremities: Normal range of motion.  Edema: None  Mental Status:  Normal mood and affect. Normal behavior. Normal judgment and thought content.   Assessment and Plan:  Pregnancy: [id number] at [redacted]w[redacted]d  1. Supervision of other normal pregnancy, antepartum  - Glucose Tolerance, 2 Hours w/1 Hour - RPR - HIV antibody - CBC - Tdap vaccine greater  than or equal to 7yo IM  2. Anemia affecting pregnancy, antepartum --Started on oral iron with hgb 10.4 at initial OB visit.  CBC drawn today so will reevaluate if pt needs PO iron. Consider ferrous gluconate or other easier to tolerate sources.  3. Poor weight gain of pregnancy, third trimester --Eat frequent small meals/snacks that are high in protein. Discussed dietary choices with pt.  Rx for Ensure for [facility]. - feeding supplement, ENSURE COMPLETE, (ENSURE COMPLETE) LIQD; Take 237 mLs by mouth 2 (two) times daily between meals.  Dispense: 25 mL; Refill: 2  Preterm labor symptoms and general obstetric precautions including but not limited to vaginal bleeding, contractions, leaking of fluid and fetal movement were reviewed in detail with the patient. Please refer to After Visit Summary for other counseling recommendations.  Return in about 2 weeks (around 07/28/2017).   Fatima Blank, CNM

## 2017-07-15 LAB — GLUCOSE TOLERANCE, 2 HOURS W/ 1HR
GLUCOSE, 2 HOUR: 95 mg/dL (ref 65–152)
GLUCOSE, FASTING: 75 mg/dL (ref 65–91)
Glucose, 1 hour: 105 mg/dL (ref 65–179)

## 2017-07-15 LAB — CBC
Hematocrit: 29.4 % — ABNORMAL LOW (ref 34.0–46.6)
Hemoglobin: 9.5 g/dL — ABNORMAL LOW (ref 11.1–15.9)
MCH: 24.9 pg — AB (ref 26.6–33.0)
MCHC: 32.3 g/dL (ref 31.5–35.7)
MCV: 77 fL — ABNORMAL LOW (ref 79–97)
PLATELETS: 250 10*3/uL (ref 150–379)
RBC: 3.81 x10E6/uL (ref 3.77–5.28)
RDW: 17.1 % — ABNORMAL HIGH (ref 12.3–15.4)
WBC: 6.1 10*3/uL (ref 3.4–10.8)

## 2017-07-15 LAB — RPR: RPR: NONREACTIVE

## 2017-07-15 LAB — HIV ANTIBODY (ROUTINE TESTING W REFLEX): HIV SCREEN 4TH GENERATION: NONREACTIVE

## 2017-07-19 ENCOUNTER — Telehealth: Payer: Self-pay | Admitting: Advanced Practice Midwife

## 2017-07-19 MED ORDER — INTEGRA 62.5-62.5-40-3 MG PO CAPS: 1.0000 | ORAL_CAPSULE | Freq: Every day | ORAL | 2 refills | Status: DC

## 2017-07-19 NOTE — Telephone Encounter (Signed)
Pt symptomatic in office visit on 07/14/17 but unable to tolerate PO iron prescribed earlier so CBC drawn to evaluate. Hgb 9.5, lower than previous labwork.  Called pt and she reports occasional dizziness and fatigue continues.  Rx for Integra daily sent to pharmacy. Pt to continue to increase iron-rich foods.  Pt going to [facility] to pick up Ensure as prescribed on 07/14/17.  Pt also reports BLE edema with onset this weekend. No hx HTN, no h/a, visual disturbances, or epigastric pain.  Elevate, increase PO fluids, decrease sodium, and try support socks PRN.  F/U as scheduled next week unless swelling is worsening. Come to MAU with any emergencies.

## 2017-07-20 ENCOUNTER — Telehealth: Payer: Self-pay | Admitting: General Practice

## 2017-07-20 DIAGNOSIS — Z3480 Encounter for supervision of other normal pregnancy, unspecified trimester: Secondary | ICD-10-CM

## 2017-07-20 MED ORDER — INTEGRA 62.5-62.5-40-3 MG PO CAPS: 1.0000 | ORAL_CAPSULE | Freq: Every day | ORAL | 2 refills | Status: DC

## 2017-07-20 MED ORDER — CONCEPT OB 130-92.4-1 MG PO CAPS: 1.0000 | ORAL_CAPSULE | Freq: Every day | ORAL | 11 refills | Status: DC

## 2017-07-20 NOTE — Telephone Encounter (Signed)
Patient called into office upset that no one had returned her phone call despite leaving multiple messages. Patient states Lattie Haw was supposed to send in a prescription for PNV and iron but the prescription isn't at her pharmacy. Apologized to patient and told her I haven't received a message from her.  Told patient it looks like the prescription went to the wrong pharmacy and I will reorder the medications. Asked patient if she still had refills on her PNV and she became angry and yelled stating I wasn't listening to her. Patient states Lattie Haw was going to send in a new prescription for a PNV because she doesn't like the ones she has. Told patient I will send a new one in. Patient yelled again stating you need to be paying attention and looking at my chart which you obviously aren't doing because you would see the right PNV order. Told patient I do see it and will send it to her right pharmacy as well as the iron. Patient states it's not just any iron so you better be sending the right ones. Told patient I am, it is the ones [place] sent in. Patient verbalized understanding & asked how long that's going to take to reach her pharmacy. Told patient the medications have been ordered so it should already be at her pharmacy. Patient verbalized understanding & had no other questions

## 2017-07-24 ENCOUNTER — Telehealth: Payer: Self-pay | Admitting: *Deleted

## 2017-07-24 NOTE — Telephone Encounter (Signed)
Patient phoned in earlier this morning.  States she is having severe leg cramps.  States she has tried walking and drinking orange juice and eating bananas.  She said none of this seems to be working.  I encouraged patient to try 2 teaspoons of yellow mustard daily.  Patient said she would try that.  Said someone else had told her to try pickle juice but she hasn't tried it yet.    I spoke with Dr. Rosana Hoes.  Called patient to make her aware.  Dr. Rosana Hoes recommends stretching to help with leg cramps.  She would also like the patient to have a potassium level drawn at her next visit just to be certain her potassium isn't too low.  Patient has appointment on Wednesday 07/26/17.  She is agreeable to the blood work.  States she did some stretching for about 2 hours last night and she still had leg cramps during the night.

## 2017-07-26 ENCOUNTER — Ambulatory Visit: Payer: Medicaid Other | Admitting: Advanced Practice Midwife

## 2017-07-26 ENCOUNTER — Encounter: Payer: Self-pay | Admitting: Advanced Practice Midwife

## 2017-07-26 ENCOUNTER — Inpatient Hospital Stay (HOSPITAL_COMMUNITY)
Admission: AD | Admit: 2017-07-26 | Discharge: 2017-07-27 | Disposition: A | Discharge status: Home / Self Care | Payer: Medicaid Other | Source: Ambulatory Visit | Attending: Obstetrics and Gynecology | Admitting: Obstetrics and Gynecology

## 2017-07-26 ENCOUNTER — Encounter (HOSPITAL_COMMUNITY): Payer: Self-pay

## 2017-07-26 DIAGNOSIS — Z9119 Patient's noncompliance with other medical treatment and regimen: Secondary | ICD-10-CM

## 2017-07-26 DIAGNOSIS — Z7982 Long term (current) use of aspirin: Secondary | ICD-10-CM | POA: Insufficient documentation

## 2017-07-26 DIAGNOSIS — R1011 Right upper quadrant pain: Secondary | ICD-10-CM | POA: Diagnosis present

## 2017-07-26 DIAGNOSIS — Z8759 Personal history of other complications of pregnancy, childbirth and the puerperium: Secondary | ICD-10-CM

## 2017-07-26 DIAGNOSIS — O99019 Anemia complicating pregnancy, unspecified trimester: Secondary | ICD-10-CM

## 2017-07-26 DIAGNOSIS — Z882 Allergy status to sulfonamides status: Secondary | ICD-10-CM | POA: Diagnosis not present

## 2017-07-26 DIAGNOSIS — R109 Unspecified abdominal pain: Secondary | ICD-10-CM | POA: Diagnosis not present

## 2017-07-26 DIAGNOSIS — O26893 Other specified pregnancy related conditions, third trimester: Secondary | ICD-10-CM | POA: Diagnosis not present

## 2017-07-26 DIAGNOSIS — R252 Cramp and spasm: Secondary | ICD-10-CM

## 2017-07-26 DIAGNOSIS — O26899 Other specified pregnancy related conditions, unspecified trimester: Secondary | ICD-10-CM

## 2017-07-26 DIAGNOSIS — Z3A30 30 weeks gestation of pregnancy: Secondary | ICD-10-CM | POA: Diagnosis not present

## 2017-07-26 DIAGNOSIS — Z91199 Patient's noncompliance with other medical treatment and regimen due to unspecified reason: Secondary | ICD-10-CM | POA: Insufficient documentation

## 2017-07-26 LAB — URINALYSIS, ROUTINE W REFLEX MICROSCOPIC
BILIRUBIN URINE: NEGATIVE
GLUCOSE, UA: NEGATIVE mg/dL
HGB URINE DIPSTICK: NEGATIVE
Ketones, ur: NEGATIVE mg/dL
Leukocytes, UA: NEGATIVE
Nitrite: NEGATIVE
PROTEIN: NEGATIVE mg/dL
Specific Gravity, Urine: 1.019 (ref 1.005–1.030)
pH: 5 (ref 5.0–8.0)

## 2017-07-26 NOTE — Progress Notes (Signed)
Not in lobby.  Called 3 times.  Was quite late for appointment but I agreed to work her back into schedule. Seabron Spates, CNM

## 2017-07-26 NOTE — MAU Note (Signed)
Pt reports intermittent upper abdominal pain x2 days. States pain is dull, sharp, achy pain. States she has taken gas medications, etc-has not helped. States today it is much worse. States she "just doesn't feel good." States she has been feeling anxious and having some lower back pain.

## 2017-07-26 NOTE — Progress Notes (Addendum)
G9P6 @ 30.[redacted] wksga. Presents to triage for abd sharp pain in the upper region of pt's abd for past 2 days. States pain is constant. Has a one year old that pats her belly and had done so for past two days. States wasn't hit hard. Denies LOF or bleeding. + FM   EFM applied. VSS see flow sheet for details.   2319 Relinquished care over to [facility]

## 2017-07-26 NOTE — MAU Provider Note (Signed)
History  CSN: [id number] Arrival date and time: 07/26/17 2156  None     Chief Complaint  Patient presents with  . Abdominal Pain    HPI: Christy [name] is a 36 y.o. [id number] with IUP at [redacted]w[redacted]d who presents to maternity admissions reporting RUQ pain for two days. Since arrival to MAU pain has resolved. Denies any contractions, leakage of fluid or vaginal bleeding. Good fetal movement.  Also denies any abnormal vaginal discharge, vaginal bleeding, fevers, chills, malaise, dysuria, hematuria, urinary frequency, nausea, vomiting, diarrhea, dizziness/lighreadhess, or headache.    OB History  Gravida Para Term Preterm AB Living  9 7 6 1 1 7   SAB TAB Ectopic Multiple Live Births  1 0 0 0 7    # Outcome Date GA Lbr Len/2nd Weight Sex Delivery Anes PTL Lv  9 Current           8 Preterm 03/30/16 [redacted]w[redacted]d 05:45 / 00:17 4 lb 8.8 oz (2.065 kg) M Vag-Spont EPI  LIV     Complications: Severe preeclampsia     Birth Comments: extra digits bilateral hands  7 Term 09/17/10 [redacted]w[redacted]d  6 lb 4 oz (2.835 kg) M Vag-Spont EPI N LIV  6 Term 08/17/07 [redacted]w[redacted]d  7 lb (3.175 kg) F Vag-Spont  N LIV  5 Term 11/10/04 [redacted]w[redacted]d  7 lb 8 oz (3.402 kg) M Vag-Spont None N LIV  4 Term 07/23/01 [redacted]w[redacted]d  7 lb 6 oz (3.345 kg) M Vag-Spont None N LIV  3 Term 05/21/99 [redacted]w[redacted]d  7 lb 4 oz (3.289 kg) M Vag-Spont None N LIV  2 Term 05/30/97 [redacted]w[redacted]d  6 lb 8 oz (2.948 kg) F Vag-Spont EPI N LIV  1 SAB             Obstetric Comments  03/30/2016 Induced for severe preeclampsia   Past Medical History:  Diagnosis Date  . Anemia   . Preeclampsia, severe 03/29/2016   Past Surgical History:  Procedure Laterality Date  . NO PAST SURGERIES     Family History  Problem Relation Age of Onset  . Hypertension Mother   . Hypertension Father   . Heart attack Father   . Stroke Father   . Hypertension Sister   . Hypertension Maternal Grandmother    Social History   Socioeconomic History  . Marital status: Married    Spouse name: Not on file  .  Number of children: Not on file  . Years of education: Not on file  . Highest education level: Not on file  Social Needs  . Financial resource strain: Not on file  . Food insecurity - worry: Not on file  . Food insecurity - inability: Not on file  . Transportation needs - medical: Not on file  . Transportation needs - non-medical: Not on file  Occupational History  . Not on file  Tobacco Use  . Smoking status: Never Smoker  . Smokeless tobacco: Never Used  Substance and Sexual Activity  . Alcohol use: No  . Drug use: No  . Sexual activity: Yes    Birth control/protection: None    Comment: IUD removed 03/2015  Other Topics Concern  . Not on file  Social History Narrative  . Not on file   Allergies  Allergen Reactions  . Diflucan [Fluconazole]     Mouth and vagina area breaks out into hives  . Sulfamethoxazole-Trimethoprim Hives  . Latex Rash  . Metronidazole Rash    If takes longer than 5 days will develop a rash  Medications Prior to Admission  Medication Sig Dispense Refill Last Dose  . acetaminophen (TYLENOL) 500 MG tablet Take 500 mg by mouth every 6 (six) hours as needed for headache.   Not Taking  . aspirin EC 81 MG tablet Take 1 tablet (81 mg total) by mouth daily. 150 tablet 1 Taking  . calcium carbonate (TUMS - DOSED IN MG ELEMENTAL CALCIUM) 500 MG chewable tablet Chew 2 tablets by mouth 2 (two) times daily as needed for indigestion or heartburn.   Taking  . Fe Fum-FePoly-Vit C-Vit B3 (INTEGRA) 62.5-62.5-40-3 MG CAPS Take 1 capsule by mouth daily. 30 capsule 2   . feeding supplement, ENSURE COMPLETE, (ENSURE COMPLETE) LIQD Take 237 mLs by mouth 2 (two) times daily between meals. 25 mL 2   . OVER THE COUNTER MEDICATION Triple t burner supplement   Not Taking  . Prenat w/o A Vit-FeFum-FePo-FA (CONCEPT OB) 130-92.4-1 MG CAPS Take 1 capsule by mouth daily. 30 capsule 11     I have reviewed patient's Past Medical Hx, Surgical Hx, Family Hx, Social Hx, medications and  allergies.   Review of Systems: Negative except for what is mentioned in HPI.  Physical Exam   Blood pressure 129/69, pulse 98, resp. rate 19, height 5\' 6"  (1.676 m), weight 204 lb (92.5 kg), last menstrual period 12/20/2016, SpO2 100 %, not currently breastfeeding.  Constitutional: Well-developed, well-nourished female in no acute distress.  HENT: [place]/AT, normal oropharynx mucosa. MMM Eyes: normal conjunctivae, no scleral icterus Cardiovascular: normal rate, regular rhythm Respiratory: normal effort, lungs CTAB.  GI: Abd soft, non-tender, on all 4 quadrants, gravid appropriate for gestational age.   GU: Neg CVAT. MSK: Extremities nontender, no edema Neurologic: Alert and oriented x 4. Psych: Normal mood and affect Skin: warm and dry   FHT:  Baseline 135, moderate variability, accelerations present, no decelerations Toco: occasional rare contraction  MAU Course/MDM:   Nursing notes and VS reviewed. Patient seen and examined, as noted above.  Patient came in for RUQ pain, but since arrival, pain resolved. Exam benign and nonfocal. BP is wnl. She does not have other signs or symptoms suggestive of pre-eclampsia.  Results reviewed:  Results for orders placed or performed during the hospital encounter of 07/26/17  Urinalysis, Routine w reflex microscopic  Result Value Ref Range   Color, Urine YELLOW YELLOW   APPearance CLEAR CLEAR   Specific Gravity, Urine 1.019 1.005 - 1.030   pH 5.0 5.0 - 8.0   Glucose, UA NEGATIVE NEGATIVE mg/dL   Hgb urine dipstick NEGATIVE NEGATIVE   Bilirubin Urine NEGATIVE NEGATIVE   Ketones, ur NEGATIVE NEGATIVE mg/dL   Protein, ur NEGATIVE NEGATIVE mg/dL   Nitrite NEGATIVE NEGATIVE   Leukocytes, UA NEGATIVE NEGATIVE      Assessment and Plan  Assessment: 1. Abdominal pain affecting pregnancy     Plan: --Discharge home in stable condition.  --PTL precautions and return precautions     Susumu Hackler, Jenne Pane, MD 07/27/2017 12:01 AM

## 2017-07-27 DIAGNOSIS — O26899 Other specified pregnancy related conditions, unspecified trimester: Secondary | ICD-10-CM

## 2017-07-27 DIAGNOSIS — R109 Unspecified abdominal pain: Secondary | ICD-10-CM | POA: Diagnosis not present

## 2017-07-27 NOTE — Discharge Instructions (Signed)
Iron Deficiency Anemia, Adult Iron-deficiency anemia is when you have a low amount of red blood cells or hemoglobin. This happens because you have too little iron in your body. Hemoglobin carries oxygen to parts of the body. Anemia can cause your body to not get enough oxygen. It may or may not cause symptoms. Follow these instructions at home: Medicines  Take over-the-counter and prescription medicines only as told by your doctor. This includes iron pills (supplements) and vitamins.  If you cannot handle taking iron pills by mouth, ask your doctor about getting iron through: ? A vein (intravenously). ? A shot (injection) into a muscle.  Take iron pills when your stomach is empty. If you cannot handle this, take them with food.  Do not drink milk or tea, or take antacids at the same time as your iron pills.  To prevent trouble pooping (constipation), eat fiber or take medicine (stool softener--Colace twice a day, and Miralax once a day) as told by your doctor. Eating and drinking  Talk with your doctor before changing the foods you eat. He or she may tell you to eat foods that have a lot of iron, such as: ? Liver. ? Lowfat (lean) beef. ? Breads and cereals that have iron added to them (fortified breads and cereals). ? Eggs. ? Dried fruit. ? Dark green, leafy vegetables.  Drink enough fluid to keep your pee (urine) clear or pale yellow.  Eat fresh fruits and vegetables that are high in vitamin C. They help your body to use iron. Foods with a lot of vitamin C include: ? Oranges. ? Peppers. ? Tomatoes. ? Mangoes. General instructions  Return to your normal activities as told by your doctor. Ask your doctor what activities are safe for you.  Keep yourself clean, and keep things clean around you (your surroundings). Anemia can make you get sick more easily.  Keep all follow-up visits as told by your doctor. This is important. Contact a doctor if:  You feel sick to your stomach  (nauseous).  You throw up (vomit).  You feel weak.  You are sweating for no clear reason.  You have trouble pooping, such as: ? Pooping (having a bowel movement) less than 3 times a week. ? Straining to poop. ? Having poop that is hard, dry, or larger than normal. ? Feeling full or bloated. ? Pain in the lower belly. ? Not feeling better after pooping. Get help right away if:  You pass out (faint). If this happens, do not drive yourself to the hospital. Call your local emergency services (911 in the [place]  You have chest pain.  You have shortness of breath that: ? Is very bad. ? Gets worse with physical activity.  You have a fast heartbeat.  You get light-headed when getting up from sitting or lying down. This information is not intended to replace advice given to you by your health care provider. Make sure you discuss any questions you have with your health care provider. Document Released: 08/27/2010 Document Revised: 04/13/2016 Document Reviewed: 04/13/2016 Elsevier Interactive Patient Education  2017 [employer].

## 2017-08-03 ENCOUNTER — Ambulatory Visit (HOSPITAL_COMMUNITY)
Admission: RE | Admit: 2017-08-03 | Discharge: 2017-08-03 | Disposition: A | Discharge status: Home / Self Care | Payer: Medicaid Other | Source: Ambulatory Visit | Attending: Obstetrics & Gynecology | Admitting: Obstetrics & Gynecology

## 2017-08-03 ENCOUNTER — Other Ambulatory Visit (HOSPITAL_COMMUNITY): Payer: Self-pay | Admitting: Obstetrics and Gynecology

## 2017-08-03 ENCOUNTER — Encounter (HOSPITAL_COMMUNITY): Payer: Self-pay | Admitting: *Deleted

## 2017-08-03 ENCOUNTER — Inpatient Hospital Stay (HOSPITAL_COMMUNITY)
Admission: AD | Admit: 2017-08-03 | Discharge: 2017-08-03 | Disposition: A | Discharge status: Home / Self Care | Payer: Medicaid Other | Source: Ambulatory Visit | Attending: Obstetrics & Gynecology | Admitting: Obstetrics & Gynecology

## 2017-08-03 DIAGNOSIS — Z3A31 31 weeks gestation of pregnancy: Secondary | ICD-10-CM | POA: Diagnosis not present

## 2017-08-03 DIAGNOSIS — O403XX0 Polyhydramnios, third trimester, not applicable or unspecified: Secondary | ICD-10-CM | POA: Insufficient documentation

## 2017-08-03 DIAGNOSIS — O99213 Obesity complicating pregnancy, third trimester: Secondary | ICD-10-CM | POA: Insufficient documentation

## 2017-08-03 DIAGNOSIS — Z8759 Personal history of other complications of pregnancy, childbirth and the puerperium: Secondary | ICD-10-CM

## 2017-08-03 DIAGNOSIS — O09523 Supervision of elderly multigravida, third trimester: Secondary | ICD-10-CM | POA: Diagnosis not present

## 2017-08-03 DIAGNOSIS — O9989 Other specified diseases and conditions complicating pregnancy, childbirth and the puerperium: Secondary | ICD-10-CM | POA: Diagnosis not present

## 2017-08-03 DIAGNOSIS — O26893 Other specified pregnancy related conditions, third trimester: Secondary | ICD-10-CM

## 2017-08-03 DIAGNOSIS — O09293 Supervision of pregnancy with other poor reproductive or obstetric history, third trimester: Secondary | ICD-10-CM | POA: Diagnosis not present

## 2017-08-03 DIAGNOSIS — R51 Headache: Secondary | ICD-10-CM

## 2017-08-03 LAB — COMPREHENSIVE METABOLIC PANEL
ALT: 26 U/L (ref 14–54)
AST: 33 U/L (ref 15–41)
Albumin: 3.1 g/dL — ABNORMAL LOW (ref 3.5–5.0)
Alkaline Phosphatase: 137 U/L — ABNORMAL HIGH (ref 38–126)
Anion gap: 11 (ref 5–15)
BUN: 6 mg/dL (ref 6–20)
CHLORIDE: 106 mmol/L (ref 101–111)
CO2: 16 mmol/L — AB (ref 22–32)
CREATININE: 0.51 mg/dL (ref 0.44–1.00)
Calcium: 9.4 mg/dL (ref 8.9–10.3)
GFR calc Af Amer: >60 mL/min (ref >60)
GFR calc non Af Amer: >60 mL/min (ref >60)
Glucose, Bld: 97 mg/dL (ref 65–99)
Potassium: 3.5 mmol/L (ref 3.5–5.1)
SODIUM: 133 mmol/L — AB (ref 135–145)
Total Bilirubin: 0.5 mg/dL (ref 0.3–1.2)
Total Protein: 6.6 g/dL (ref 6.5–8.1)

## 2017-08-03 LAB — URINALYSIS, ROUTINE W REFLEX MICROSCOPIC
BILIRUBIN URINE: NEGATIVE
Glucose, UA: NEGATIVE mg/dL
Hgb urine dipstick: NEGATIVE
KETONES UR: NEGATIVE mg/dL
LEUKOCYTES UA: NEGATIVE
Nitrite: NEGATIVE
PROTEIN: 30 mg/dL — AB
Specific Gravity, Urine: 1.023 (ref 1.005–1.030)
pH: 6 (ref 5.0–8.0)

## 2017-08-03 LAB — CBC
HCT: 31.5 % — ABNORMAL LOW (ref 36.0–46.0)
Hemoglobin: 10.2 g/dL — ABNORMAL LOW (ref 12.0–15.0)
MCH: 25.8 pg — ABNORMAL LOW (ref 26.0–34.0)
MCHC: 32.4 g/dL (ref 30.0–36.0)
MCV: 79.7 fL (ref 78.0–100.0)
PLATELETS: 241 10*3/uL (ref 150–400)
RBC: 3.95 MIL/uL (ref 3.87–5.11)
RDW: 18.5 % — ABNORMAL HIGH (ref 11.5–15.5)
WBC: 6.8 10*3/uL (ref 4.0–10.5)

## 2017-08-03 LAB — PROTEIN / CREATININE RATIO, URINE
Creatinine, Urine: 194 mg/dL
Protein Creatinine Ratio: 0.12 mg/mg{Cre} (ref 0.00–0.15)
TOTAL PROTEIN, URINE: 23 mg/dL

## 2017-08-03 MED ORDER — SODIUM CHLORIDE 0.9 % IV SOLN
INTRAVENOUS | Status: DC
  Administered 2017-08-03: 14:00:00 via INTRAVENOUS

## 2017-08-03 MED ORDER — DIPHENHYDRAMINE HCL 50 MG/ML IJ SOLN
25.0000 mg | Freq: Once | INTRAMUSCULAR | Status: AC
  Administered 2017-08-03: 25 mg via INTRAVENOUS
  Filled 2017-08-03: qty 1

## 2017-08-03 MED ORDER — DEXAMETHASONE SODIUM PHOSPHATE 10 MG/ML IJ SOLN
10.0000 mg | Freq: Once | INTRAMUSCULAR | Status: AC
  Administered 2017-08-03: 10 mg via INTRAVENOUS
  Filled 2017-08-03: qty 1

## 2017-08-03 MED ORDER — METOCLOPRAMIDE HCL 5 MG/ML IJ SOLN
10.0000 mg | Freq: Once | INTRAMUSCULAR | Status: AC
  Administered 2017-08-03: 10 mg via INTRAVENOUS
  Filled 2017-08-03: qty 2

## 2017-08-03 NOTE — MAU Provider Note (Signed)
Chief Complaint:  Headache   First Provider Initiated Contact with Patient 08/03/17 1310     HPI  HPI: Christy [name] is a 36 y.o. [id number] at 69w4dwho presents to maternity admissions reporting headache. Patient in MFM today for BPP. BPP 8/8; AFI 31. Patient reported headache to MFM staff & has history of preeclampsia in previous pregnancy so was sent here for evaluation.  Reports frontal headache since yesterday. Describes as dull pain. Endorses photophobia. Rates pain 9/10. Took 2 ES tylenol this morning without relief. Denies blurred vision, epigastric pain, n/v, or abdominal pain. Positive fetal movement.  Denies contractions.   Past Medical History: Past Medical History:  Diagnosis Date  . Anemia   . Preeclampsia, severe 03/29/2016    Past obstetric history: OB History  Gravida Para Term Preterm AB Living  9 7 6 1 1 7   SAB TAB Ectopic Multiple Live Births  1 0 0 0 7    # Outcome Date GA Lbr Len/2nd Weight Sex Delivery Anes PTL Lv  9 Current           8 Preterm 03/30/16 [redacted]w[redacted]d 05:45 / 00:17 4 lb 8.8 oz (2.065 kg) M Vag-Spont EPI  LIV     Complications: Severe preeclampsia     Birth Comments: extra digits bilateral hands  7 Term 09/17/10 [redacted]w[redacted]d  6 lb 4 oz (2.835 kg) M Vag-Spont EPI N LIV  6 Term 08/17/07 [redacted]w[redacted]d  7 lb (3.175 kg) F Vag-Spont  N LIV  5 Term 11/10/04 [redacted]w[redacted]d  7 lb 8 oz (3.402 kg) M Vag-Spont None N LIV  4 Term 07/23/01 [redacted]w[redacted]d  7 lb 6 oz (3.345 kg) M Vag-Spont None N LIV  3 Term 05/21/99 [redacted]w[redacted]d  7 lb 4 oz (3.289 kg) M Vag-Spont None N LIV  2 Term 05/30/97 [redacted]w[redacted]d  6 lb 8 oz (2.948 kg) F Vag-Spont EPI N LIV  1 SAB             Obstetric Comments  03/30/2016 Induced for severe preeclampsia    Past Surgical History: Past Surgical History:  Procedure Laterality Date  . NO PAST SURGERIES      Family History: Family History  Problem Relation Age of Onset  . Hypertension Mother   . Hypertension Father   . Heart attack Father   . Stroke Father   . Hypertension Sister    . Hypertension Maternal Grandmother     Social History: Social History   Tobacco Use  . Smoking status: Never Smoker  . Smokeless tobacco: Never Used  Substance Use Topics  . Alcohol use: No  . Drug use: No    Allergies:  Allergies  Allergen Reactions  . Diflucan [Fluconazole]     Mouth and vagina area breaks out into hives  . Sulfamethoxazole-Trimethoprim Hives  . Latex Rash  . Metronidazole Rash    If takes longer than 5 days will develop a rash    Meds:  Medications Prior to Admission  Medication Sig Dispense Refill Last Dose  . acetaminophen (TYLENOL) 500 MG tablet Take 500 mg by mouth every 6 (six) hours as needed for headache.   Not Taking  . aspirin EC 81 MG tablet Take 1 tablet (81 mg total) by mouth daily. 150 tablet 1 Taking  . calcium carbonate (TUMS - DOSED IN MG ELEMENTAL CALCIUM) 500 MG chewable tablet Chew 2 tablets by mouth 2 (two) times daily as needed for indigestion or heartburn.   Taking  . Fe Fum-FePoly-Vit C-Vit B3 (INTEGRA) 62.5-62.5-40-3 MG  CAPS Take 1 capsule by mouth daily. 30 capsule 2   . feeding supplement, ENSURE COMPLETE, (ENSURE COMPLETE) LIQD Take 237 mLs by mouth 2 (two) times daily between meals. 25 mL 2   . OVER THE COUNTER MEDICATION Triple t burner supplement   Not Taking  . Prenat w/o A Vit-FeFum-FePo-FA (CONCEPT OB) 130-92.4-1 MG CAPS Take 1 capsule by mouth daily. 30 capsule 11     I have reviewed patient's Past Medical Hx, Surgical Hx, Family Hx, Social Hx, medications and allergies.   ROS:  Review of Systems  Constitutional: Negative.   Eyes: Positive for photophobia. Negative for visual disturbance.  Gastrointestinal: Negative.   Genitourinary: Negative.   Neurological: Positive for headaches. Negative for dizziness.   Other systems negative  Physical Exam   Patient Vitals for the past 24 hrs:  BP Temp Temp src Pulse Resp SpO2 Height Weight  08/03/17 1457 (!) 122/59 - - (!) 104 18 - - -  08/03/17 1327 119/66 - - 90 -  - - -  08/03/17 1227 124/69 97.9 F (36.6 C) Oral 83 17 100 % 5\' 6"  (1.676 m) 202 lb (91.6 kg)   Constitutional: Well-developed, well-nourished female in no acute distress.  Cardiovascular: normal rate and rhythm Respiratory: normal effort, clear to auscultation bilaterally GI: Abd soft, non-tender, gravid appropriate for gestational age.   No rebound or guarding. MS: Extremities nontender, no edema, normal ROM Neurologic: Alert and oriented x 4. Bilateral patellar DTR 2+, no clonus  Dilation: Closed Exam by:: Robyne Askew  CNM   FHT:  Baseline 130 , moderate variability, accelerations present, no decelerations Contractions: initially every 2-3 minutes. Patient denies feeling ctx. Decreased after IV fluids   Labs: Results for orders placed or performed during the hospital encounter of 08/03/17 (from the past 24 hour(s))  Urinalysis, Routine w reflex microscopic     Status: Abnormal   Collection Time: 08/03/17 12:22 PM  Result Value Ref Range   Color, Urine YELLOW YELLOW   APPearance CLEAR CLEAR   Specific Gravity, Urine 1.023 1.005 - 1.030   pH 6.0 5.0 - 8.0   Glucose, UA NEGATIVE NEGATIVE mg/dL   Hgb urine dipstick NEGATIVE NEGATIVE   Bilirubin Urine NEGATIVE NEGATIVE   Ketones, ur NEGATIVE NEGATIVE mg/dL   Protein, ur 30 (A) NEGATIVE mg/dL   Nitrite NEGATIVE NEGATIVE   Leukocytes, UA NEGATIVE NEGATIVE   RBC / HPF 0-5 0 - 5 RBC/hpf   WBC, UA 0-5 0 - 5 WBC/hpf   Bacteria, UA RARE (A) NONE SEEN   Squamous Epithelial / LPF 0-5 (A) NONE SEEN   Mucus PRESENT    B/Positive/-- (09/24 1406)  Imaging:  [place] Mfm Fetal Bpp Wo Non Stress  Result Date: 08/03/2017 ----------------------------------------------------------------------  OBSTETRICS REPORT                      (Signed Final 08/03/2017 11:43 am) ---------------------------------------------------------------------- Patient Info  ID #:       [id number]                          D.O.B.:  1981/04/07 (36 yrs)  Name:       Christy Crow  Helm               Visit Date: 08/03/2017 11:03 am ---------------------------------------------------------------------- Performed By  Performed By:     Raquel James         Ref. Address:     [address]  [place], [place]  Attending:        Griffin Dakin MD         Location:         [facility]  Referred By:      Woodroe Mode                    MD ---------------------------------------------------------------------- Orders   #  Description                                 Code   1  [place] MFM OB FOLLOW UP                         [id number]   2  [place] MFM FETAL BPP WO NON STRESS              76819.01  ----------------------------------------------------------------------   #  Ordered By               Order #        Accession #    Episode #   1  Seward Meth              [id number]      [id number]     [id number]   2  BRIAN BROST              [id number]      [id number]     [id number]  ---------------------------------------------------------------------- Indications   [redacted] weeks gestation of pregnancy                Y7X.41   Obesity complicating pregnancy, third          O99.213   trimester   Advanced maternal age multigravida 63+,        O39.523   third trimester-neg quad   Poor obstetric history: Previous               O09.299   preeclampsia / eclampsia/gestational HTN   Poor obstetric history: Previous fetal growth  O09.299   restriction (FGR)  ---------------------------------------------------------------------- OB History  Gravidity:    9         Term:   7  Prem:   0        SAB:   1  TOP:          0       Ectopic:  0        Living: 7 ---------------------------------------------------------------------- Fetal Evaluation  Num Of Fetuses:     1  Cardiac Activity:   Observed  Presentation:        Cephalic  Placenta:           Posterior, above cervical os  P. Cord Insertion:  Previously Visualized  Amniotic Fluid  AFI FV:      Polyhydramnios  AFI Sum(cm)     %Tile       Largest Pocket(cm)  31.09           > 97        8.65  RUQ(cm)       RLQ(cm)       LUQ(cm)        LLQ(cm)  6.17          8.52          7.75           8.65 ---------------------------------------------------------------------- Biophysical Evaluation  Amniotic F.V:   Polyhydramnios             F. Tone:        Observed  F. Movement:    Observed                   Score:          8/8  F. Breathing:   Observed ---------------------------------------------------------------------- Biometry  BPD:      83.2  mm     G. Age:  33w 3d         89  %    CI:         82.9   %    70 - 86                                                          FL/HC:      20.9   %    19.1 - 21.3  HC:      288.2  mm     G. Age:  31w 5d         19  %    HC/AC:      0.97        0.96 - 1.17  AC:      296.5  mm     G. Age:  33w 5d         93  %    FL/BPD:     72.2   %    71 - 87  FL:       60.1  mm     G. Age:  31w 2d         29  %    FL/AC:      20.3   %    20 - 24  HUM:      54.1  mm     G. Age:  31w 3d         50  %  Est. FW:    2040  gm      4 lb 8 oz  76  % ---------------------------------------------------------------------- Gestational Age  U/S Today:     32w 4d                                        EDD:   09/24/17  Best:          31w 4d     Det. ByLoman Chroman         EDD:   10/01/17                                      (02/22/17) ---------------------------------------------------------------------- Anatomy  Cranium:               Appears normal         Aortic Arch:            Previously seen  Cavum:                 Previously seen        Ductal Arch:            Appears normal  Ventricles:            Appears normal         Diaphragm:              Previously seen  Choroid Plexus:        Previously seen        Stomach:                Appears normal, left                                                                         sided  Cerebellum:            Previously seen        Abdomen:                Appears normal  Posterior Fossa:       Previously seen        Abdominal Wall:         Previously seen  Nuchal Fold:           Not applicable (>09    Cord Vessels:           Previously seen                         wks GA)  Face:                  Orbits and profile     Kidneys:                Appear normal                         previously seen  Lips:                  Appears normal         Bladder:  Appears normal  Thoracic:              Appears normal         Spine:                  Previously seen  Heart:                 Previously seen        Upper Extremities:      Previously seen  RVOT:                  Appears normal         Lower Extremities:      Previously seen  LVOT:                  Appears normal  Other:  Nasal bone previously visualized. Heels previously visualized. Fetus          appears to be a female. Technically difficult due to fetal position. ---------------------------------------------------------------------- Cervix Uterus Adnexa  Cervix  Not visualized (advanced GA >29wks)  Uterus  No abnormality visualized.  Left Ovary  Not visualized.  Right Ovary  Not visualized.  Cul De Sac:   No free fluid seen.  Adnexa:       No abnormality visualized. ---------------------------------------------------------------------- Impression  Single living intrauterine pregnancy at 21+9 weeks  Cephalic presentation.  Placenta Posterior, above cervical os.  Appropriate fetal growth in the 76th percentile  Amniotic fluid volume is elevated today with AFI 31.1 cm  Normal interval review of fetal anatomy.  BPP 8/8 ---------------------------------------------------------------------- Recommendations  Recommend repeat BPP weekly due to idio[pathic  polyhydramnios  Patient has had a headache for > 1 weeks with no relief today  with acetaminophen  Patient was instructed  to go to MAU for evaluation ----------------------------------------------------------------------                 Griffin Dakin, MD Electronically Signed Final Report   08/03/2017 11:43 am ----------------------------------------------------------------------  [place] Mfm Ob Follow Up  Result Date: 08/03/2017 ----------------------------------------------------------------------  OBSTETRICS REPORT                      (Signed Final 08/03/2017 11:43 am) ---------------------------------------------------------------------- Patient Info  ID #:       [id number]                          D.O.B.:  1980/12/25 (36 yrs)  Name:       Christy [name]               Visit Date: 08/03/2017 11:03 am ---------------------------------------------------------------------- Performed By  Performed By:     Raquel James         Ref. Address:     [address]  [place], [place]  Attending:        Griffin Dakin MD         Location:         [facility]  Referred By:      Woodroe Mode                    MD ---------------------------------------------------------------------- Orders   #  Description                                 Code   1  [place] MFM OB FOLLOW UP                         [phone]   2  [place] MFM FETAL BPP WO NON STRESS              76819.01  ----------------------------------------------------------------------   #  Ordered By               Order #        Accession #    Episode #   1  Seward Meth              [id number]      [id number]     [id number]   2  [place]              [id number]      [id number]     [id number]  ---------------------------------------------------------------------- Indications   [redacted] weeks gestation of pregnancy                K0X.38   Obesity complicating pregnancy, third          O99.213    trimester   Advanced maternal age multigravida 27+,        O66.523   third trimester-neg quad   Poor obstetric history: Previous               O09.299   preeclampsia / eclampsia/gestational HTN   Poor obstetric history: Previous fetal growth  O09.299   restriction (FGR)  ---------------------------------------------------------------------- OB History  Gravidity:    9         Term:   7        Prem:   0        SAB:   1  TOP:          0       Ectopic:  0        Living: 7 ---------------------------------------------------------------------- Fetal Evaluation  Num Of Fetuses:     1  Cardiac Activity:   Observed  Presentation:       Cephalic  Placenta:           Posterior, above cervical os  P. Cord Insertion:  Previously Visualized  Amniotic Fluid  AFI FV:      Polyhydramnios  AFI Sum(cm)     %Tile       Largest Pocket(cm)  31.09           > 97  8.65  RUQ(cm)       RLQ(cm)       LUQ(cm)        LLQ(cm)  6.17          8.52          7.75           8.65 ---------------------------------------------------------------------- Biophysical Evaluation  Amniotic F.V:   Polyhydramnios             F. Tone:        Observed  F. Movement:    Observed                   Score:          8/8  F. Breathing:   Observed ---------------------------------------------------------------------- Biometry  BPD:      83.2  mm     G. Age:  33w 3d         89  %    CI:         82.9   %    70 - 86                                                          FL/HC:      20.9   %    19.1 - 21.3  HC:      288.2  mm     G. Age:  31w 5d         19  %    HC/AC:      0.97        0.96 - 1.17  AC:      296.5  mm     G. Age:  33w 5d         93  %    FL/BPD:     72.2   %    71 - 87  FL:       60.1  mm     G. Age:  31w 2d         29  %    FL/AC:      20.3   %    20 - 24  HUM:      54.1  mm     G. Age:  31w 3d         50  %  Est. FW:    2040  gm      4 lb 8 oz     76  % ---------------------------------------------------------------------- Gestational Age  U/S  Today:     32w 4d                                        EDD:   09/24/17  Best:          31w 4d     Det. ByLoman Chroman         EDD:   10/01/17                                      (02/22/17) ---------------------------------------------------------------------- Anatomy  Cranium:  Appears normal         Aortic Arch:            Previously seen  Cavum:                 Previously seen        Ductal Arch:            Appears normal  Ventricles:            Appears normal         Diaphragm:              Previously seen  Choroid Plexus:        Previously seen        Stomach:                Appears normal, left                                                                        sided  Cerebellum:            Previously seen        Abdomen:                Appears normal  Posterior Fossa:       Previously seen        Abdominal Wall:         Previously seen  Nuchal Fold:           Not applicable (>00    Cord Vessels:           Previously seen                         wks GA)  Face:                  Orbits and profile     Kidneys:                Appear normal                         previously seen  Lips:                  Appears normal         Bladder:                Appears normal  Thoracic:              Appears normal         Spine:                  Previously seen  Heart:                 Previously seen        Upper Extremities:      Previously seen  RVOT:                  Appears normal         Lower Extremities:      Previously seen  LVOT:  Appears normal  Other:  Nasal bone previously visualized. Heels previously visualized. Fetus          appears to be a female. Technically difficult due to fetal position. ---------------------------------------------------------------------- Cervix Uterus Adnexa  Cervix  Not visualized (advanced GA >29wks)  Uterus  No abnormality visualized.  Left Ovary  Not visualized.  Right Ovary  Not visualized.  Cul De Sac:   No free fluid seen.  Adnexa:        No abnormality visualized. ---------------------------------------------------------------------- Impression  Single living intrauterine pregnancy at 92+9 weeks  Cephalic presentation.  Placenta Posterior, above cervical os.  Appropriate fetal growth in the 76th percentile  Amniotic fluid volume is elevated today with AFI 31.1 cm  Normal interval review of fetal anatomy.  BPP 8/8 ---------------------------------------------------------------------- Recommendations  Recommend repeat BPP weekly due to idio[pathic  polyhydramnios  Patient has had a headache for > 1 weeks with no relief today  with acetaminophen  Patient was instructed to go to MAU for evaluation ----------------------------------------------------------------------                 Griffin Dakin, MD Electronically Signed Final Report   08/03/2017 11:43 am ----------------------------------------------------------------------   MAU Course/MDM: Cervix closed/thick, ctx decreased after IV fluids. Patient denies any abdominal pain or contractions Pt normotensive while in MAU with neg PIH labs Headache cocktail given (decadron, benadryl, reglan) -- pt reports complete resolution in headache.     Assessment: 1. Pregnancy headache in third trimester   2. Polyhydramnios in third trimester complication, single or unspecified fetus   3. [redacted] weeks gestation of pregnancy     Plan: Discharge home PTL & PIH precautions discussed. Msg to [facility] for f/u appointment & weekly BPPs to be scheduled Follow up in Office for prenatal visits and recheck   Pt stable at time of discharge.  Jorje Guild, FNP 08/03/2017 1:10 PM

## 2017-08-03 NOTE — Discharge Instructions (Signed)
Polyhydramnios When a woman becomes pregnant, a sac is formed around the fertilized egg (embryo) and later the growing baby (fetus). This sac is called the amniotic sac. The amniotic sac is filled with fluid. It gets bigger as the pregnancy grows. When there is too much fluid in the sac, it is called polyhydramnios. All babies born with polyhydramnios should be checked for congenital abnormalities. The amniotic fluid cushions and protects the baby. It also provides the baby with fluids and is crucial to normal development. Your baby breathes this fluid into its lungs and swallows it. This helps promote the healthy growth of the lungs and gastrointestinal tract. Amniotic fluid also helps the baby move around, helping with the normal development of muscle and bone. What are the causes? This condition is caused by:  Diabetes mellitus.  Down's syndrome, fetal abnormalities of the intestinal tract, and anencephaly (fetus without brain), all of which can prevent the fetus from swallowing the amniotic fluid.  One twins passes (transfuses) its blood into the other twin (twin-twin transfusion syndrome).  Medical illness of the mother, e.g., kidney or heart disease.  Tumor (chorioangioma) of the placenta.  What are the signs or symptoms? Symptoms of this condition include:  Enlarged uterus. The size of the uterus enlarges beyond what it should be for that particular time of the pregnancy.  Pressure and discomfort. The mother may feel more pressure and discomfort than should be expected.  Quick and unexpected enlargement of the mother's stomach.  How is this diagnosed? This condition is diagnosed when your health care provider measures you and notices that your uterus is beyond the size that is consistent with the time of the pregnancy.  An ultrasound is then used (abdominally or vaginally) to: ? See if you are carrying twins or more. ? Measure the growth of the baby. ? Look for birth  defects. ? Measure the amount of fluid in the amniotic sac.  Amniotic Fluid Index (AFI) measures the amount of fluid in the amniotic sac in four different areas. If there is more than 24 centimeters, you have polyhydramnios. How is this treated? This condition may be treated by:  Removing some fluid from the amniotic sac.  Taking medications that lower the fluids in your body.  Stopping the use of salt or salty foods because it causes you to keep fluid in your body (retention).  Follow these instructions at home:  Keep all your prenatal visits as told by your health care provider. It is important to follow your health care provider's recommendations.  Do not eat a lot of salt and salty foods.  If you have diabetes, keep it under control.  If you have heart or kidney disease, treat the disease as told by your health care provider. Contact a health care provider if:  You think your uterus has grown too fast in a short period of time.  You feel a great amount of pressure in your lower belly (pelvis) and are more uncomfortable than expected. Get help right away if:  You have a gush of fluid or are leaking fluid from your vagina.  You stop feeling the baby move.  You do not feel the baby kicking as much as usual.  You have a hard time keeping your diabetes under control.  You are having problems with your heart or kidney disease. This information is not intended to replace advice given to you by your health care provider. Make sure you discuss any questions you have with your health   care provider. Document Released: 10/15/2002 Document Revised: 12/31/2015 Document Reviewed: 02/21/2013 Elsevier Interactive Patient Education  2018 [employer] Inc.  

## 2017-08-03 NOTE — MAU Note (Signed)
Pt reports she was sent here from MFM due to u/s showing increased fluid and pt reports headache today.

## 2017-08-08 NOTE — L&D Delivery Note (Signed)
Patient: Christy [name] MRN: [id number]  GBS status: negative  Patient is a 37 y.o. now [id number] s/p NSVD at [redacted]w[redacted]d, who was admitted for IOL for polyhydramnios. AROM 2h 20m prior to delivery with clear fluid.   Delivery Note At 6:12 AM a viable female was delivered via Vaginal, Spontaneous (Presentation: vertex; LOA).  APGAR: 9, 9; weight pending Placenta status: intact.  Cord: 3-vessel  Anesthesia:  Epidural Episiotomy: None Lacerations: None Suture Repair: none Est. Blood Loss (mL): 150  Head delivered LOA. Nuchal cord x 1 present, loose, and shoulders body delivered through in usual fashion, then baby flipped with umbilical cord reduced. Infant with spontaneous cry, placed on mother's abdomen, dried and bulb suctioned. Cord clamped x 2 after 1-minute delay, and cut by family member. Cord blood drawn. Placenta delivered spontaneously with gentle cord traction. Fundus firm with massage and Pitocin. Perineum inspected and found to have no lacerations.  Mom to postpartum.  Baby to Couplet care / Skin to Skin.  Almyra Free P. Gia Lusher, MD OB Fellow 09/23/17, 7:16 AM

## 2017-08-09 ENCOUNTER — Encounter: Payer: Self-pay | Admitting: Obstetrics and Gynecology

## 2017-08-09 ENCOUNTER — Ambulatory Visit (INDEPENDENT_AMBULATORY_CARE_PROVIDER_SITE_OTHER): Payer: Medicaid Other | Admitting: Obstetrics and Gynecology

## 2017-08-09 ENCOUNTER — Telehealth: Payer: Self-pay | Admitting: Obstetrics and Gynecology

## 2017-08-09 VITALS — BP 130/63 | HR 96 | Wt 200.6 lb

## 2017-08-09 DIAGNOSIS — O409XX0 Polyhydramnios, unspecified trimester, not applicable or unspecified: Secondary | ICD-10-CM

## 2017-08-09 DIAGNOSIS — O09529 Supervision of elderly multigravida, unspecified trimester: Secondary | ICD-10-CM

## 2017-08-09 DIAGNOSIS — Z3480 Encounter for supervision of other normal pregnancy, unspecified trimester: Secondary | ICD-10-CM

## 2017-08-09 DIAGNOSIS — Z8759 Personal history of other complications of pregnancy, childbirth and the puerperium: Secondary | ICD-10-CM

## 2017-08-09 NOTE — Progress Notes (Signed)
On & off pain around stomach.

## 2017-08-09 NOTE — Progress Notes (Signed)
   PRENATAL VISIT NOTE  Subjective:  Christy [name] is a 37 y.o. [id number] at [redacted]w[redacted]d being seen today for ongoing prenatal care.  She is currently monitored for the following issues for this high-risk pregnancy and has Encounter for supervision of multigravida with advanced maternal age; History of severe pre-eclampsia; Short interval between pregnancies affecting pregnancy, antepartum; Supervision of other normal pregnancy, antepartum; History of prior pregnancy with small for gestational age newborn; Anemia complicating pregnancy; Noncompliance; and Polyhydramnios affecting pregnancy on their problem list.  Patient reports no complaints and had minor headache on arrival but much improved now.  Contractions: Not present. Vag. Bleeding: None.  Movement: Present. Denies leaking of fluid.   The following portions of the patient's history were reviewed and updated as appropriate: allergies, current medications, past family history, past medical history, past social history, past surgical history and problem list. Problem list updated.  Objective:   Vitals:   08/09/17 1444  BP: 130/63  Pulse: 96  Weight: 200 lb 9.6 oz (91 kg)    Fetal Status: Fetal Heart Rate (bpm): 154   Movement: Present     General:  Alert, oriented and cooperative. Patient is in no acute distress.  Skin: Skin is warm and dry. No rash noted.   Cardiovascular: Normal heart rate noted  Respiratory: Normal respiratory effort, no problems with respiration noted  Abdomen: Soft, gravid, appropriate for gestational age.  Pain/Pressure: Present     Pelvic: Cervical exam deferred        Extremities: Normal range of motion.  Edema: Trace  Mental Status:  Normal mood and affect. Normal behavior. Normal judgment and thought content.   Assessment and Plan:  Pregnancy: [id number] at [redacted]w[redacted]d  1. History of severe pre-eclampsia Cont baby ASA  2. Encounter for supervision of multigravida with advanced maternal age  32. Supervision of  other normal pregnancy, antepartum  4. History of prior pregnancy with small for gestational age newborn Last growth 76th%tile  5. Polyhydramnios affecting pregnancy, idiopathic AFI 31 on 12/27 For weekly testing   Preterm labor symptoms and general obstetric precautions including but not limited to vaginal bleeding, contractions, leaking of fluid and fetal movement were reviewed in detail with the patient. Please refer to After Visit Summary for other counseling recommendations.  Return in about 1 week (around 08/16/2017) for OB visit (MD).   Sloan Leiter, MD

## 2017-08-09 NOTE — Telephone Encounter (Signed)
Patient called very upset because she does not want to see [facility]. She stated they didn't know what they were doing, and refuse to see anybody but an MD.

## 2017-08-11 ENCOUNTER — Encounter (HOSPITAL_COMMUNITY): Payer: Self-pay | Admitting: *Deleted

## 2017-08-11 ENCOUNTER — Inpatient Hospital Stay (HOSPITAL_COMMUNITY): Payer: Medicaid Other

## 2017-08-11 ENCOUNTER — Encounter: Payer: Self-pay | Admitting: *Deleted

## 2017-08-11 ENCOUNTER — Other Ambulatory Visit: Payer: Medicaid Other

## 2017-08-11 ENCOUNTER — Inpatient Hospital Stay (HOSPITAL_COMMUNITY)
Admission: AD | Admit: 2017-08-11 | Discharge: 2017-08-11 | Disposition: A | Discharge status: Home / Self Care | Payer: Medicaid Other | Source: Ambulatory Visit | Attending: Obstetrics & Gynecology | Admitting: Obstetrics & Gynecology

## 2017-08-11 DIAGNOSIS — Z3689 Encounter for other specified antenatal screening: Secondary | ICD-10-CM

## 2017-08-11 DIAGNOSIS — Z3A32 32 weeks gestation of pregnancy: Secondary | ICD-10-CM | POA: Insufficient documentation

## 2017-08-11 DIAGNOSIS — Z8759 Personal history of other complications of pregnancy, childbirth and the puerperium: Secondary | ICD-10-CM

## 2017-08-11 DIAGNOSIS — O409XX0 Polyhydramnios, unspecified trimester, not applicable or unspecified: Secondary | ICD-10-CM

## 2017-08-11 DIAGNOSIS — O403XX0 Polyhydramnios, third trimester, not applicable or unspecified: Secondary | ICD-10-CM | POA: Diagnosis not present

## 2017-08-11 DIAGNOSIS — Z3A33 33 weeks gestation of pregnancy: Secondary | ICD-10-CM

## 2017-08-11 LAB — URINALYSIS, ROUTINE W REFLEX MICROSCOPIC
Bilirubin Urine: NEGATIVE
GLUCOSE, UA: NEGATIVE mg/dL
HGB URINE DIPSTICK: NEGATIVE
Ketones, ur: 20 mg/dL — AB
Leukocytes, UA: NEGATIVE
Nitrite: NEGATIVE
PH: 5 (ref 5.0–8.0)
PROTEIN: NEGATIVE mg/dL
Specific Gravity, Urine: 1.023 (ref 1.005–1.030)

## 2017-08-11 NOTE — Discharge Instructions (Signed)
Polyhydramnios When a woman becomes pregnant, a sac is formed around the fertilized egg (embryo) and later the growing baby (fetus). This sac is called the amniotic sac. The amniotic sac is filled with fluid. It gets bigger as the pregnancy grows. When there is too much fluid in the sac, it is called polyhydramnios. All babies born with polyhydramnios should be checked for congenital abnormalities. The amniotic fluid cushions and protects the baby. It also provides the baby with fluids and is crucial to normal development. Your baby breathes this fluid into its lungs and swallows it. This helps promote the healthy growth of the lungs and gastrointestinal tract. Amniotic fluid also helps the baby move around, helping with the normal development of muscle and bone. What are the causes? This condition is caused by:  Diabetes mellitus.  Down's syndrome, fetal abnormalities of the intestinal tract, and anencephaly (fetus without brain), all of which can prevent the fetus from swallowing the amniotic fluid.  One twins passes (transfuses) its blood into the other twin (twin-twin transfusion syndrome).  Medical illness of the mother, e.g., kidney or heart disease.  Tumor (chorioangioma) of the placenta.  What are the signs or symptoms? Symptoms of this condition include:  Enlarged uterus. The size of the uterus enlarges beyond what it should be for that particular time of the pregnancy.  Pressure and discomfort. The mother may feel more pressure and discomfort than should be expected.  Quick and unexpected enlargement of the mother's stomach.  How is this diagnosed? This condition is diagnosed when your health care provider measures you and notices that your uterus is beyond the size that is consistent with the time of the pregnancy.  An ultrasound is then used (abdominally or vaginally) to: ? See if you are carrying twins or more. ? Measure the growth of the baby. ? Look for birth  defects. ? Measure the amount of fluid in the amniotic sac.  Amniotic Fluid Index (AFI) measures the amount of fluid in the amniotic sac in four different areas. If there is more than 24 centimeters, you have polyhydramnios. How is this treated? This condition may be treated by:  Removing some fluid from the amniotic sac.  Taking medications that lower the fluids in your body.  Stopping the use of salt or salty foods because it causes you to keep fluid in your body (retention).  Follow these instructions at home:  Keep all your prenatal visits as told by your health care provider. It is important to follow your health care provider's recommendations.  Do not eat a lot of salt and salty foods.  If you have diabetes, keep it under control.  If you have heart or kidney disease, treat the disease as told by your health care provider. Contact a health care provider if:  You think your uterus has grown too fast in a short period of time.  You feel a great amount of pressure in your lower belly (pelvis) and are more uncomfortable than expected. Get help right away if:  You have a gush of fluid or are leaking fluid from your vagina.  You stop feeling the baby move.  You do not feel the baby kicking as much as usual.  You have a hard time keeping your diabetes under control.  You are having problems with your heart or kidney disease. This information is not intended to replace advice given to you by your health care provider. Make sure you discuss any questions you have with your health   care provider. Document Released: 10/15/2002 Document Revised: 12/31/2015 Document Reviewed: 02/21/2013 Elsevier Interactive Patient Education  2018 [employer] Inc.  

## 2017-08-11 NOTE — MAU Provider Note (Signed)
History  CSN: [id number] Arrival date and time: 08/11/17 1246  First Provider Initiated Contact with Patient 08/11/17 1323      Chief Complaint  Patient presents with  . Non-stress Test    HPI: Christy [name] is a 37 y.o. [id number] with IUP at [redacted]w[redacted]d who presents to maternity admissions for NST and BPP. She was scheduled for BPP w/ NST today in clinic due to polyhydramnios, but was not able to make it in time to appointment. Reports good fetal movement. Denies vaginal bleeding, discharge, or LOF. Denies any acute concerns.    OB History  Gravida Para Term Preterm AB Living  9 7 6 1 1 7   SAB TAB Ectopic Multiple Live Births  1 0 0 0 7    # Outcome Date GA Lbr Len/2nd Weight Sex Delivery Anes PTL Lv  9 Current           8 Preterm 03/30/16 [redacted]w[redacted]d 05:45 / 00:17 4 lb 8.8 oz (2.065 kg) M Vag-Spont EPI  LIV     Complications: Severe preeclampsia     Birth Comments: extra digits bilateral hands  7 Term 09/17/10 [redacted]w[redacted]d  6 lb 4 oz (2.835 kg) M Vag-Spont EPI N LIV  6 Term 08/17/07 [redacted]w[redacted]d  7 lb (3.175 kg) F Vag-Spont  N LIV  5 Term 11/10/04 [redacted]w[redacted]d  7 lb 8 oz (3.402 kg) M Vag-Spont None N LIV  4 Term 07/23/01 [redacted]w[redacted]d  7 lb 6 oz (3.345 kg) M Vag-Spont None N LIV  3 Term 05/21/99 [redacted]w[redacted]d  7 lb 4 oz (3.289 kg) M Vag-Spont None N LIV  2 Term 05/30/97 [redacted]w[redacted]d  6 lb 8 oz (2.948 kg) F Vag-Spont EPI N LIV  1 SAB             Obstetric Comments  03/30/2016 Induced for severe preeclampsia   Past Medical History:  Diagnosis Date  . Anemia   . Preeclampsia, severe 03/29/2016   Past Surgical History:  Procedure Laterality Date  . NO PAST SURGERIES     Family History  Problem Relation Age of Onset  . Hypertension Mother   . Hypertension Father   . Heart attack Father   . Stroke Father   . Hypertension Sister   . Hypertension Maternal Grandmother    Social History   Socioeconomic History  . Marital status: Married    Spouse name: Not on file  . Number of children: Not on file  . Years of education:  Not on file  . Highest education level: Not on file  Social Needs  . Financial resource strain: Not on file  . Food insecurity - worry: Not on file  . Food insecurity - inability: Not on file  . Transportation needs - medical: Not on file  . Transportation needs - non-medical: Not on file  Occupational History  . Not on file  Tobacco Use  . Smoking status: Never Smoker  . Smokeless tobacco: Never Used  Substance and Sexual Activity  . Alcohol use: No  . Drug use: No  . Sexual activity: Yes    Birth control/protection: None    Comment: IUD removed 03/2015  Other Topics Concern  . Not on file  Social History Narrative  . Not on file   Allergies  Allergen Reactions  . Diflucan [Fluconazole]     Mouth and vagina area breaks out into hives  . Sulfamethoxazole-Trimethoprim Hives  . Latex Rash  . Metronidazole Rash    If takes longer than 5 days will  develop a rash    Medications Prior to Admission  Medication Sig Dispense Refill Last Dose  . acetaminophen (TYLENOL) 500 MG tablet Take 500 mg by mouth every 6 (six) hours as needed for headache.   08/10/2017 at Unknown time  . aspirin EC 81 MG tablet Take 1 tablet (81 mg total) by mouth daily. 150 tablet 1 08/10/2017 at Unknown time  . calcium carbonate (TUMS - DOSED IN MG ELEMENTAL CALCIUM) 500 MG chewable tablet Chew 2 tablets by mouth 2 (two) times daily as needed for indigestion or heartburn.   08/10/2017 at Unknown time  . Fe Fum-FePoly-Vit C-Vit B3 (INTEGRA) 62.5-62.5-40-3 MG CAPS Take 1 capsule by mouth daily. (Patient not taking: Reported on 08/11/2017) 30 capsule 2 Not Taking at Unknown time  . Ferrous Gluconate 324 (37.5 Fe) MG TABS TAKE 1 TABLET (324 MG TOTAL) BY MOUTH DAILY  3 08/09/2017  . Prenat w/o A Vit-FeFum-FePo-FA (CONCEPT OB) 130-92.4-1 MG CAPS Take 1 capsule by mouth daily. 30 capsule 11 08/09/2017    I have reviewed patient's Past Medical Hx, Surgical Hx, Family Hx, Social Hx, medications and allergies.   Review of  Systems: Negative except for what is mentioned in HPI.  Physical Exam   Blood pressure 125/68, pulse 86, temperature 98.2 F (36.8 C), temperature source Oral, resp. rate 16, height 5\' 5"  (1.651 m), weight 200 lb (90.7 kg), last menstrual period 12/20/2016, SpO2 100 %, not currently breastfeeding.  Constitutional: Well-developed, well-nourished female in no acute distress.  HENT: [place]/AT Eyes: normal conjunctivae, no scleral icterus Cardiovascular: normal rate Respiratory: normal effort GI: Abd soft, non-tender, gravid appropriate for gestational age.   MSK: Extremities nontender, no edema Neurologic: Alert and oriented x 4. Psych: Normal mood and affect Skin: warm and dry   FHT:  Baseline 135 , moderate variability, accelerations present, no decelerations Toco: no ctx  MAU Course/MDM:   Nursing notes and VS reviewed. Patient seen and examined, as noted above.  Reactive NST BPP 8/10 AFI 23.7  Assessment and Plan  Assessment: 1. Polyhydramnios affecting pregnancy   2. History of prior pregnancy with small for gestational age newborn   78. Polyhydramnios   4. NST (non-stress test) reactive     Plan: --Discharge home in stable condition.  --Repeat antenatal testing in 1 week, appt already scheduled  Degele, Jenne Pane, MD 08/11/2017 1:32 PM  Future Appointments  Date Time Provider [place]  08/18/2017  1:15 PM WOC-WOCA NST WOC-WOCA WOC  08/23/2017  9:15 AM WOC-WOCA NST WOC-WOCA WOC  08/23/2017 10:15 AM Woodroe Mode, MD WOC-WOCA WOC  08/30/2017  2:15 PM WOC-WOCA NST WOC-WOCA WOC  08/30/2017  3:15 PM Sloan Leiter, MD [place]  09/07/2017  1:15 PM WOC-WOCA NST WOC-WOCA WOC  09/07/2017  2:15 PM Woodroe Mode, MD [place]  09/14/2017 10:15 AM WOC-WOCA NST WOC-WOCA WOC  09/14/2017 11:15 AM Woodroe Mode, MD [place]  09/20/2017  1:15 PM WOC-WOCA NST WOC-WOCA WOC  09/20/2017  2:15 PM Sloan Leiter, MD WOC-WOCA [place]  09/27/2017  1:15 PM WOC-WOCA NST WOC-WOCA WOC   09/27/2017  2:15 PM Sloan Leiter, MD WOC-WOCA [place]  10/05/2017 10:15 AM WOC-WOCA NST WOC-WOCA WOC  10/05/2017 11:15 AM Woodroe Mode, MD [place]

## 2017-08-11 NOTE — MAU Note (Signed)
Pt reports she is here from clinic for monitoring and an u/s

## 2017-08-11 NOTE — Progress Notes (Signed)
Patient into office today for appointment for NST/BPP.  Patient's appointment was scheduled for 1115.  Patient states she was told to arrive at 1130 for appointment.  Patient states she needs to be seen today.  Patient is upset and states she has been here with her last pregnancy and the same things are happening this time.  States she calls and no one answers the phone or she leaves messages and no one returns her call.  States she is not late for her appointment and we need to see her today.  I explained to patient our office closes today at 12 pm and we cannot see her today.  Patient states she is very concerned about her condition - polyhydramnios.  States she has looked it up on google and is worried about what will happen.  States during her last pregnancy she received conflicting information between midwives and MDs.  States "they missed my blood pressures being up and I had to have emergency delivery".  States she has 7 other children and she must advocate for herself and her baby.  States she is very active and concerned about the fluid around the baby.  Is worried about what will happen if she is out somewhere and her water breaks and the cord comes out.  States she needs to ask the doctor about the plan.  States she wants to know if her fluid level gets to a certain point what will be the plan at that point.  States she was supposed to have an ultrasound and still doesn't have an appointment.  I explained the ultrasound is the BPP along with the NST today.  I explained what the NST and BPP look at.  Patient states understanding.  Wants to be seen today.  I explained to patient we could not see her in our office but we could see if MFM could see her for a BPP today.  Called MFM no appointments available.  Explained to patient she has 2 options.  She can go to MAU today for NST/BPP and evaluation or she can wait until Monday 08/14/17 and come back to the office for an appointment for NST/BPP.  I explained  we could do whichever she prefers.  Patient states she wants to be seen.  I escorted patient to MAU for registration and to be seen.  Report given to Jorje Guild, NP and Len Blalock, CNM.  Patient's next office appointment is scheduled for Friday 08/18/17 at 1315.  Appointment date and time confirmed with patient.  Patient states understanding.

## 2017-08-16 ENCOUNTER — Telehealth: Payer: Self-pay | Admitting: *Deleted

## 2017-08-16 NOTE — Telephone Encounter (Signed)
Called patient again and call went straight to voicemail.  Message states voicemail box is full.  Will wait for patient to call back.

## 2017-08-16 NOTE — Telephone Encounter (Signed)
Received message from front desk that patient called this morning with questions in regards to something in her chart.  States she has left a message on the nurse line and no one has returned her call.  Contacted patient via phone.  States she has 2 questions.  Asks what the fluid volume on Friday was.  States the technician and the doctor told her different things.  I looked up the results and explained total fluid volume is subjectively upper-normal and the amount was 23.7.  Patient states she has looked at this online and it isn't so far from normal.  I was trying to explain to the patient the volume is the upper level of the normal range.  We were then disconnected.  I have tried to contact patient via phone twice since and the phone switches immediately to voicemail.  I have left a message asking the patient to call our office back and press the option for appointments and ask to speak to me.  I also told her I would try to call her again a little later.

## 2017-08-16 NOTE — Telephone Encounter (Signed)
Patient returned my call.  Has questions regarding her dating.  States she is measuring 3 weeks ahead.  Thinks her dating is incorrect.  I pulled chart up and explained we have her LMP documented as 12/20/16.  Patient states that is incorrect.  States her LMP was 12/10/16.  I told her she could let the nurse know at her next appointment and they would update her record.  Patient states understanding.  I explained the date of her dating ultrasound and how her dating is based off that ultrasound.  Patient states she still thinks her dating is off.  States this is her 7th child and she knows.  States she is having contractions though not regular.  States she is afraid she might be out somewhere and go into labor.  I explained this could happen to anyone.  There is no way to predict when she will go into labor.  I have encouraged patient to write down her questions so she won't forget what she wants to discuss with the provider at her next visit.  Patient states she will do that.  I also told patient I would like for her to sign up for My Chart.  I explained this is a great way to get in touch with [place] and to review her results.  I explained it does take a few days to see test results.  Patient states she does want to do this.  I explained I would have someone send her a text message to sign up for My Chart today.  Explained it is so much easier to contact us than going through the phone.  Patient states she will sign up.  Patient has no further questions at this time.

## 2017-08-17 ENCOUNTER — Encounter: Payer: Medicaid Other | Admitting: Certified Nurse Midwife

## 2017-08-17 ENCOUNTER — Other Ambulatory Visit: Payer: Medicaid Other

## 2017-08-18 ENCOUNTER — Ambulatory Visit: Payer: Self-pay

## 2017-08-18 ENCOUNTER — Ambulatory Visit (INDEPENDENT_AMBULATORY_CARE_PROVIDER_SITE_OTHER): Payer: Medicaid Other | Admitting: *Deleted

## 2017-08-18 VITALS — BP 123/66 | HR 104

## 2017-08-18 DIAGNOSIS — O403XX0 Polyhydramnios, third trimester, not applicable or unspecified: Secondary | ICD-10-CM

## 2017-08-18 DIAGNOSIS — O409XX0 Polyhydramnios, unspecified trimester, not applicable or unspecified: Secondary | ICD-10-CM

## 2017-08-18 DIAGNOSIS — Z20818 Contact with and (suspected) exposure to other bacterial communicable diseases: Secondary | ICD-10-CM

## 2017-08-18 DIAGNOSIS — Z3483 Encounter for supervision of other normal pregnancy, third trimester: Secondary | ICD-10-CM

## 2017-08-18 NOTE — Addendum Note (Signed)
Addended by: Langston Reusing on: 08/18/2017 03:50 PM   Modules accepted: Orders

## 2017-08-18 NOTE — Progress Notes (Addendum)
Pt informed that the ultrasound is considered a limited OB ultrasound and is not intended to be a complete ultrasound exam.  Patient also informed that the ultrasound is not being completed with the intent of assessing for fetal or placental anomalies or any pelvic abnormalities.  Explained that the purpose of today's ultrasound is to assess for presentation, BPP and amniotic fluid volume.  Patient acknowledges the purpose of the exam and the limitations of the study.  Pt states everyone in her household has strep throat and she requests to be tested.  Rapid strep test performed as requested. Per observation, pt did not have redness, inflammation, exudate or white patches on throat.

## 2017-08-20 LAB — CULTURE, GROUP A STREP: Strep A Culture: NEGATIVE

## 2017-08-21 NOTE — Progress Notes (Signed)
I have reviewed this chart and agree with the RN/CMA assessment and management.    K. Meryl Torrin Frein, M.D. Attending Obstetrician & Gynecologist, [facility], [facility]  

## 2017-08-23 ENCOUNTER — Encounter: Payer: Self-pay | Admitting: Obstetrics & Gynecology

## 2017-08-23 ENCOUNTER — Encounter: Payer: Medicaid Other | Admitting: Obstetrics & Gynecology

## 2017-08-23 ENCOUNTER — Ambulatory Visit: Payer: Self-pay

## 2017-08-23 ENCOUNTER — Ambulatory Visit (INDEPENDENT_AMBULATORY_CARE_PROVIDER_SITE_OTHER): Payer: Medicaid Other | Admitting: *Deleted

## 2017-08-23 ENCOUNTER — Ambulatory Visit (INDEPENDENT_AMBULATORY_CARE_PROVIDER_SITE_OTHER): Payer: Medicaid Other | Admitting: Obstetrics & Gynecology

## 2017-08-23 VITALS — BP 115/61 | HR 105 | Wt 202.9 lb

## 2017-08-23 DIAGNOSIS — O409XX0 Polyhydramnios, unspecified trimester, not applicable or unspecified: Secondary | ICD-10-CM | POA: Diagnosis present

## 2017-08-23 DIAGNOSIS — Z3483 Encounter for supervision of other normal pregnancy, third trimester: Secondary | ICD-10-CM

## 2017-08-23 NOTE — Progress Notes (Signed)
Pt thinks her due date is incorrect.

## 2017-08-23 NOTE — Progress Notes (Signed)
   PRENATAL VISIT NOTE  Subjective:  Christy [name] is a 37 y.o. [id number] at [redacted]w[redacted]d being seen today for ongoing prenatal care.  She is currently monitored for the following issues for this high-risk pregnancy and has Encounter for supervision of multigravida with advanced maternal age; History of severe pre-eclampsia; Short interval between pregnancies affecting pregnancy, antepartum; Supervision of other normal pregnancy, antepartum; History of prior pregnancy with small for gestational age newborn; Anemia complicating pregnancy; and Noncompliance on their problem list.  Patient reports occasional contractions.  Contractions: Irregular. Vag. Bleeding: None.  Movement: Present. Denies leaking of fluid.   The following portions of the patient's history were reviewed and updated as appropriate: allergies, current medications, past family history, past medical history, past social history, past surgical history and problem list. Problem list updated.  Objective:   Vitals:   08/23/17 0950  BP: 115/61  Pulse: (!) 105  Weight: 202 lb 14.4 oz (92 kg)    Fetal Status: Fetal Heart Rate (bpm): NST   Movement: Present     General:  Alert, oriented and cooperative. Patient is in no acute distress.  Skin: Skin is warm and dry. No rash noted.   Cardiovascular: Normal heart rate noted  Respiratory: Normal respiratory effort, no problems with respiration noted  Abdomen: Soft, gravid, appropriate for gestational age.  Pain/Pressure: Present     Pelvic: Cervical exam deferred        Extremities: Normal range of motion.     Mental Status:  Normal mood and affect. Normal behavior. Normal judgment and thought content.   Assessment and Plan:  Pregnancy: [id number] at [redacted]w[redacted]d  1. Encounter for supervision of other normal pregnancy in third trimester AFI nl today  2. Polyhydramnios affecting pregnancy Normal AFI now, no need for fetal surveillance  Preterm labor symptoms and general obstetric precautions  including but not limited to vaginal bleeding, contractions, leaking of fluid and fetal movement were reviewed in detail with the patient. Please refer to After Visit Summary for other counseling recommendations.  Return in about 1 week (around 08/30/2017) for no NST or [place], do GBS. Reviewed her dating today  Emeterio Reeve, MD

## 2017-08-23 NOTE — Progress Notes (Signed)

## 2017-08-23 NOTE — Patient Instructions (Signed)

## 2017-08-25 ENCOUNTER — Inpatient Hospital Stay (HOSPITAL_COMMUNITY)
Admission: AD | Admit: 2017-08-25 | Discharge: 2017-08-25 | Disposition: A | Discharge status: Home / Self Care | Payer: Medicaid Other | Source: Ambulatory Visit | Attending: Obstetrics and Gynecology | Admitting: Obstetrics and Gynecology

## 2017-08-25 ENCOUNTER — Encounter (HOSPITAL_COMMUNITY): Payer: Self-pay | Admitting: *Deleted

## 2017-08-25 DIAGNOSIS — N949 Unspecified condition associated with female genital organs and menstrual cycle: Secondary | ICD-10-CM

## 2017-08-25 DIAGNOSIS — Z7982 Long term (current) use of aspirin: Secondary | ICD-10-CM | POA: Insufficient documentation

## 2017-08-25 DIAGNOSIS — Z3A35 35 weeks gestation of pregnancy: Secondary | ICD-10-CM | POA: Diagnosis not present

## 2017-08-25 DIAGNOSIS — O26893 Other specified pregnancy related conditions, third trimester: Secondary | ICD-10-CM | POA: Insufficient documentation

## 2017-08-25 DIAGNOSIS — Z882 Allergy status to sulfonamides status: Secondary | ICD-10-CM | POA: Insufficient documentation

## 2017-08-25 DIAGNOSIS — R102 Pelvic and perineal pain: Secondary | ICD-10-CM | POA: Diagnosis present

## 2017-08-25 NOTE — MAU Provider Note (Signed)
History     CSN: [id number]  Arrival date and time: 08/25/17 2130   First Provider Initiated Contact with Patient 08/25/17 1956      Chief Complaint  Patient presents with  . pelvic pressure   HPI  Ms.  Christy [name] is a 37 y.o. year old [id number] female at [redacted]w[redacted]d weeks gestation who presents to MAU requesting to have "cervix checked before going out of town to [place] this weekend". She states that she is having some pelvic pressure in lower abdomen and in lower back as well. She states that she has "had babies before and feels like her baby is going to come flying out". She denies VB or LOF. She reports good (+) FM. She had polyhydramnios earlier in this pregnancy, but it is resolved.    Past Medical History:  Diagnosis Date  . Anemia   . Preeclampsia, severe 03/29/2016    Past Surgical History:  Procedure Laterality Date  . NO PAST SURGERIES      Family History  Problem Relation Age of Onset  . Hypertension Mother   . Hypertension Father   . Heart attack Father   . Stroke Father   . Hypertension Sister   . Hypertension Maternal Grandmother     Social History   Tobacco Use  . Smoking status: Never Smoker  . Smokeless tobacco: Never Used  Substance Use Topics  . Alcohol use: No  . Drug use: No    Allergies:  Allergies  Allergen Reactions  . Diflucan [Fluconazole]     Mouth and vagina area breaks out into hives  . Sulfamethoxazole-Trimethoprim Hives  . Latex Rash  . Metronidazole Rash    If takes longer than 5 days will develop a rash    Medications Prior to Admission  Medication Sig Dispense Refill Last Dose  . acetaminophen (TYLENOL) 500 MG tablet Take 500 mg by mouth every 6 (six) hours as needed for headache.   Not Taking  . aspirin EC 81 MG tablet Take 1 tablet (81 mg total) by mouth daily. (Patient not taking: Reported on 08/23/2017) 150 tablet 1 Not Taking  . calcium carbonate (TUMS - DOSED IN MG ELEMENTAL CALCIUM) 500 MG chewable tablet Chew 2  tablets by mouth 2 (two) times daily as needed for indigestion or heartburn.   Taking  . Fe Fum-FePoly-Vit C-Vit B3 (INTEGRA) 62.5-62.5-40-3 MG CAPS Take 1 capsule by mouth daily. (Patient not taking: Reported on 08/11/2017) 30 capsule 2 Not Taking  . Ferrous Gluconate 324 (37.5 Fe) MG TABS TAKE 1 TABLET (324 MG TOTAL) BY MOUTH DAILY  3 Taking  . Prenat w/o A Vit-FeFum-FePo-FA (CONCEPT OB) 130-92.4-1 MG CAPS Take 1 capsule by mouth daily. 30 capsule 11 Taking    Review of Systems  Constitutional: Negative.   HENT: Negative.   Eyes: Negative.   Respiratory: Negative.   Cardiovascular: Negative.   Gastrointestinal: Positive for abdominal pain.  Endocrine: Negative.   Genitourinary: Positive for pelvic pain (+) pressure.  Musculoskeletal: Negative.   Skin: Negative.   Allergic/Immunologic: Negative.   Neurological: Negative.   Hematological: Negative.   Psychiatric/Behavioral: Negative.    Physical Exam   Blood pressure (!) 119/53, pulse (!) 106, temperature 98.2 F (36.8 C), temperature source Oral, resp. rate 18, last menstrual period 12/20/2016.  Physical Exam  Nursing note and vitals reviewed. Constitutional: She is oriented to person, place, and time. She appears well-developed and well-nourished.  HENT:  Head: Normocephalic.  Eyes: Pupils are equal, round, and reactive to light.  Neck: Normal range of motion.  Respiratory: Effort normal.  Genitourinary:  Genitourinary Comments: Cervix: closed/soft/high/vtx/ballotable  Musculoskeletal: Normal range of motion.  Neurological: She is alert and oriented to person, place, and time.  Skin: Skin is warm and dry.  Psychiatric: She has a normal mood and affect. Her behavior is normal. Judgment and thought content normal.    MAU Course  Procedures  MDM NST offered to patient -- declined stating she "was not here for that. Only wants cervix checked so I can know if I can go out of town this weekend" / explained the importance of  having a complete check of fetal well-being with NST -- patient still declines NST FHTs by doppler: 154 bpm   Assessment and Plan  Pelvic pressure in pregnancy, antepartum, third trimester - Reassurance given that cervix is not dilated - Discussed labor precautions - Advised that if out of town and thinks she is in labor to go to the nearest hospital to be examined - Discharge home - Keep scheduled [place] appt on 08/30/2017 - Patient verbalized an understanding of the plan of care and agrees.    Laury Deep, MSN, CNM 08/25/2017, 7:56 PM

## 2017-08-25 NOTE — MAU Note (Signed)
Pt feeling pressure in vagina and rectum. Few contractions. Just wants to be checked before she leaves town. Declined NST.  FHR 154.  Denies LOF and bleeding. +FM

## 2017-08-30 ENCOUNTER — Ambulatory Visit (INDEPENDENT_AMBULATORY_CARE_PROVIDER_SITE_OTHER): Payer: Medicaid Other | Admitting: Obstetrics and Gynecology

## 2017-08-30 ENCOUNTER — Other Ambulatory Visit: Payer: Medicaid Other

## 2017-08-30 ENCOUNTER — Encounter: Payer: Medicaid Other | Admitting: Obstetrics & Gynecology

## 2017-08-30 ENCOUNTER — Other Ambulatory Visit (HOSPITAL_COMMUNITY)
Admission: RE | Admit: 2017-08-30 | Discharge: 2017-08-30 | Disposition: A | Discharge status: Home / Self Care | Payer: Medicaid Other | Source: Ambulatory Visit | Attending: Obstetrics and Gynecology | Admitting: Obstetrics and Gynecology

## 2017-08-30 VITALS — BP 121/50 | HR 116 | Wt 200.9 lb

## 2017-08-30 DIAGNOSIS — D649 Anemia, unspecified: Secondary | ICD-10-CM | POA: Insufficient documentation

## 2017-08-30 DIAGNOSIS — O09523 Supervision of elderly multigravida, third trimester: Secondary | ICD-10-CM | POA: Insufficient documentation

## 2017-08-30 DIAGNOSIS — O09899 Supervision of other high risk pregnancies, unspecified trimester: Secondary | ICD-10-CM

## 2017-08-30 DIAGNOSIS — Z8759 Personal history of other complications of pregnancy, childbirth and the puerperium: Secondary | ICD-10-CM

## 2017-08-30 DIAGNOSIS — Z9119 Patient's noncompliance with other medical treatment and regimen: Secondary | ICD-10-CM | POA: Insufficient documentation

## 2017-08-30 DIAGNOSIS — Z3483 Encounter for supervision of other normal pregnancy, third trimester: Secondary | ICD-10-CM | POA: Diagnosis present

## 2017-08-30 DIAGNOSIS — O99019 Anemia complicating pregnancy, unspecified trimester: Secondary | ICD-10-CM

## 2017-08-30 DIAGNOSIS — O99013 Anemia complicating pregnancy, third trimester: Secondary | ICD-10-CM | POA: Diagnosis not present

## 2017-08-30 DIAGNOSIS — Z3480 Encounter for supervision of other normal pregnancy, unspecified trimester: Secondary | ICD-10-CM

## 2017-08-30 DIAGNOSIS — Z91199 Patient's noncompliance with other medical treatment and regimen due to unspecified reason: Secondary | ICD-10-CM

## 2017-08-30 DIAGNOSIS — Z3A36 36 weeks gestation of pregnancy: Secondary | ICD-10-CM | POA: Insufficient documentation

## 2017-08-30 DIAGNOSIS — O09529 Supervision of elderly multigravida, unspecified trimester: Secondary | ICD-10-CM

## 2017-08-30 LAB — OB RESULTS CONSOLE GC/CHLAMYDIA: Gonorrhea: NEGATIVE

## 2017-08-30 LAB — OB RESULTS CONSOLE GBS: GBS: NEGATIVE

## 2017-08-30 NOTE — Progress Notes (Signed)
   PRENATAL VISIT NOTE  Subjective:  Christy [name] is a 37 y.o. [id number] at [redacted]w[redacted]d being seen today for ongoing prenatal care.  She is currently monitored for the following issues for this high-risk pregnancy and has Encounter for supervision of multigravida with advanced maternal age; History of severe pre-eclampsia; Short interval between pregnancies affecting pregnancy, antepartum; Supervision of other normal pregnancy, antepartum; History of prior pregnancy with small for gestational age newborn; Anemia complicating pregnancy; Noncompliance; and Pelvic pressure in pregnancy, antepartum, third trimester on their problem list.  Patient reports occasional contractions.  Contractions: Irregular. Vag. Bleeding: None.  Movement: Present. Denies leaking of fluid. Denies headache, visual disturbance.   The following portions of the patient's history were reviewed and updated as appropriate: allergies, current medications, past family history, past medical history, past social history, past surgical history and problem list. Problem list updated.  Objective:   Vitals:   08/30/17 1539  BP: (!) 121/50  Pulse: (!) 116  Weight: 200 lb 14.4 oz (91.1 kg)    Fetal Status: Fetal Heart Rate (bpm): 140 Fundal Height: 36 cm Movement: Present     General:  Alert, oriented and cooperative. Patient is in no acute distress.  Skin: Skin is warm and dry. No rash noted.   Cardiovascular: Normal heart rate noted  Respiratory: Normal respiratory effort, no problems with respiration noted  Abdomen: Soft, gravid, appropriate for gestational age.  Pain/Pressure: Present     Pelvic: closed, soft        Extremities: Normal range of motion.  Edema: Trace  Mental Status:  Normal mood and affect. Normal behavior. Normal judgment and thought content.   Assessment and Plan:  Pregnancy: [id number] at [redacted]w[redacted]d  1. Encounter for supervision of multigravida with advanced maternal age  77. History of severe pre-eclampsia Not  taking baby ASA asympotomatic Reviewed s/s to present to MAU  3. Anemia affecting pregnancy, antepartum  4. Noncompliance Has been inconsistent with care  6. Supervision of other normal pregnancy, antepartum  7. Polyhydramnios resolved  Preterm labor symptoms and general obstetric precautions including but not limited to vaginal bleeding, contractions, leaking of fluid and fetal movement were reviewed in detail with the patient. Please refer to After Visit Summary for other counseling recommendations.  Return in about 1 week (around 09/06/2017) for OB visit (MD).   Sloan Leiter, MD

## 2017-08-31 LAB — GC/CHLAMYDIA PROBE AMP (~~LOC~~) NOT AT ARMC
Chlamydia: NEGATIVE
Neisseria Gonorrhea: NEGATIVE

## 2017-09-03 LAB — CULTURE, BETA STREP (GROUP B ONLY): Strep Gp B Culture: NEGATIVE

## 2017-09-04 ENCOUNTER — Encounter (HOSPITAL_COMMUNITY): Payer: Self-pay | Admitting: *Deleted

## 2017-09-04 ENCOUNTER — Other Ambulatory Visit: Payer: Self-pay

## 2017-09-04 ENCOUNTER — Inpatient Hospital Stay (HOSPITAL_COMMUNITY)
Admission: AD | Admit: 2017-09-04 | Discharge: 2017-09-04 | Disposition: A | Discharge status: Home / Self Care | Payer: Medicaid Other | Source: Ambulatory Visit | Attending: Obstetrics and Gynecology | Admitting: Obstetrics and Gynecology

## 2017-09-04 DIAGNOSIS — Z3A38 38 weeks gestation of pregnancy: Secondary | ICD-10-CM | POA: Diagnosis not present

## 2017-09-04 DIAGNOSIS — O471 False labor at or after 37 completed weeks of gestation: Secondary | ICD-10-CM | POA: Insufficient documentation

## 2017-09-04 DIAGNOSIS — O479 False labor, unspecified: Secondary | ICD-10-CM

## 2017-09-04 NOTE — MAU Note (Signed)
Contractions started 2 hrs ago- getting stronger., doesn't think she has had any leaking or bleeding

## 2017-09-04 NOTE — MAU Note (Signed)
..   I have communicated with Len Blalock, CNM and reviewed vital signs:  Vitals:   09/04/17 1843 09/04/17 2015  BP: (!) 117/59 124/67  Pulse: (!) 107 99  Resp: 18   Temp: 98.6 F (37 C)   SpO2: 100%     Vaginal exam:  Dilation: Fingertip Effacement (%): Thick Cervical Position: Middle Station: -3 Exam by:: Pearley Millington, RN ,   Also reviewed contraction pattern and that non-stress test is reactive.  It has been documented that patient is contracting every 2-3 minutes with no cervical change over one  hours not indicating active labor.  Patient denies any other complaints.  Based on this report provider has given order for discharge.  A discharge order and diagnosis entered by a provider.   Labor discharge instructions reviewed with patient.

## 2017-09-06 ENCOUNTER — Other Ambulatory Visit: Payer: Self-pay | Admitting: *Deleted

## 2017-09-06 ENCOUNTER — Other Ambulatory Visit: Payer: Self-pay | Admitting: Family

## 2017-09-06 ENCOUNTER — Encounter: Payer: Medicaid Other | Admitting: Obstetrics and Gynecology

## 2017-09-06 MED ORDER — PHENYLEPH-SHARK LIV OIL-MO-PET 0.25-3-14-71.9 % RE OINT: 1.0000 "application " | TOPICAL_OINTMENT | Freq: Two times a day (BID) | RECTAL | 1 refills | Status: DC | PRN

## 2017-09-06 MED ORDER — TUCKS 50 % EX PADS: 1.0000 | MEDICATED_PAD | Freq: Every day | CUTANEOUS | 2 refills | Status: DC | PRN

## 2017-09-07 ENCOUNTER — Ambulatory Visit (INDEPENDENT_AMBULATORY_CARE_PROVIDER_SITE_OTHER): Payer: Medicaid Other | Admitting: Obstetrics & Gynecology

## 2017-09-07 ENCOUNTER — Other Ambulatory Visit: Payer: Medicaid Other

## 2017-09-07 VITALS — BP 129/47 | HR 103 | Wt 203.2 lb

## 2017-09-07 DIAGNOSIS — O09529 Supervision of elderly multigravida, unspecified trimester: Secondary | ICD-10-CM

## 2017-09-07 MED ORDER — COMFORT FIT MATERNITY SUPP LG MISC: 1.0000 [IU] | Freq: Every day | 0 refills | Status: DC

## 2017-09-07 NOTE — Progress Notes (Signed)
Pt states is experiencing pressure in bottom,cramping type pain,can be unbearable

## 2017-09-07 NOTE — Progress Notes (Signed)
   PRENATAL VISIT NOTE  Subjective:  Christy [name] is a 37 y.o. [id number] at [redacted]w[redacted]d being seen today for ongoing prenatal care.  She is currently monitored for the following issues for this high-risk pregnancy and has Encounter for supervision of multigravida with advanced maternal age; History of severe pre-eclampsia; Short interval between pregnancies affecting pregnancy, antepartum; Supervision of other normal pregnancy, antepartum; History of prior pregnancy with small for gestational age newborn; Anemia complicating pregnancy; Noncompliance; and Pelvic pressure in pregnancy, antepartum, third trimester on their problem list.  Patient reports Significant pelvic pressure with irregular contractions starting in her back and moving to the front. Also endorses passing a mucus plug this morning, no blood noted. No leaking of fluids or vaginal bleeding..  Contractions: Regular. Vag. Bleeding: None.  Movement: Present.  The following portions of the patient's history were reviewed and updated as appropriate: allergies, current medications, past family history, past medical history, past social history, past surgical history and problem list. Problem list updated.  Objective:   Vitals:   09/07/17 1443  BP: (!) 129/47  Pulse: (!) 103  Weight: 203 lb 3.2 oz (92.2 kg)    Fetal Status: Fetal Heart Rate (bpm): 143   Movement: Present  Presentation: Vertex  General:  Alert, oriented and cooperative. Patient is in no acute distress.  Skin: Skin is warm and dry. No rash noted.   Cardiovascular: Normal heart rate noted  Respiratory: Normal respiratory effort, no problems with respiration noted  Abdomen: Soft, gravid, appropriate for gestational age.  Pain/Pressure: Present     Pelvic: Cervical exam performed Dilation: 1 Effacement (%): 40 Station: -3  Extremities: Normal range of motion.  Edema: Trace  Mental Status:  Normal mood and affect. Normal behavior. Normal judgment and thought content.    Assessment and Plan:  Pregnancy: [id number] at [redacted]w[redacted]d  1. Encounter for supervision of multigravida with advanced maternal age - Irregular contractions with moderate pelvic pressure - Cervix 1/40/-3 without LOF or VB - Expectant management - Elastic Bandages & Supports (COMFORT FIT MATERNITY SUPP LG) MISC; 1 Units by Does not apply route daily.  Dispense: 1 each; Refill: 0  Term labor symptoms and general obstetric precautions including but not limited to vaginal bleeding, contractions, leaking of fluid and fetal movement were reviewed in detail with the patient.  Please refer to After Visit Summary for other counseling recommendations.  Return in about 1 week (around 09/14/2017).   Rulon Eisenmenger, Student-PA

## 2017-09-07 NOTE — Patient Instructions (Signed)
Vaginal Delivery Vaginal delivery means that you will give birth by pushing your baby out of your birth canal (vagina). A team of health care providers will help you before, during, and after vaginal delivery. Birth experiences are unique for every woman and every pregnancy, and birth experiences vary depending on where you choose to give birth. What should I do to prepare for my baby's birth? Before your baby is born, it is important to talk with your health care provider about:  Your labor and delivery preferences. These may include: ? Medicines that you may be given. ? How you will manage your pain. This might include non-medical pain relief techniques or injectable pain relief such as epidural analgesia. ? How you and your baby will be monitored during labor and delivery. ? Who may be in the labor and delivery room with you. ? Your feelings about surgical delivery of your baby (cesarean delivery, or C-section) if this becomes necessary. ? Your feelings about receiving donated blood through an IV tube (blood transfusion) if this becomes necessary.  Whether you are able: ? To take pictures or videos of the birth. ? To eat during labor and delivery. ? To move around, walk, or change positions during labor and delivery.  What to expect after your baby is born, such as: ? Whether delayed umbilical cord clamping and cutting is offered. ? Who will care for your baby right after birth. ? Medicines or tests that may be recommended for your baby. ? Whether breastfeeding is supported in your hospital or birth center. ? How long you will be in the hospital or birth center.  How any medical conditions you have may affect your baby or your labor and delivery experience.  To prepare for your baby's birth, you should also:  Attend all of your health care visits before delivery (prenatal visits) as recommended by your health care provider. This is important.  Prepare your home for your baby's  arrival. Make sure that you have: ? Diapers. ? Baby clothing. ? Feeding equipment. ? Safe sleeping arrangements for you and your baby.  Install a car seat in your vehicle. Have your car seat checked by a certified car seat installer to make sure that it is installed safely.  Think about who will help you with your new baby at home for at least the first several weeks after delivery.  What can I expect when I arrive at the birth center or hospital? Once you are in labor and have been admitted into the hospital or birth center, your health care provider may:  Review your pregnancy history and any concerns you have.  Insert an IV tube into one of your veins. This is used to give you fluids and medicines.  Check your blood pressure, pulse, temperature, and heart rate (vital signs).  Check whether your bag of water (amniotic sac) has broken (ruptured).  Talk with you about your birth plan and discuss pain control options.  Monitoring Your health care provider may monitor your contractions (uterine monitoring) and your baby's heart rate (fetal monitoring). You may need to be monitored:  Often, but not continuously (intermittently).  All the time or for long periods at a time (continuously). Continuous monitoring may be needed if: ? You are taking certain medicines, such as medicine to relieve pain or make your contractions stronger. ? You have pregnancy or labor complications.  Monitoring may be done by:  Placing a special stethoscope or a handheld monitoring device on your abdomen to   check your baby's heartbeat, and feeling your abdomen for contractions. This method of monitoring does not continuously record your baby's heartbeat or your contractions.  Placing monitors on your abdomen (external monitors) to record your baby's heartbeat and the frequency and length of contractions. You may not have to wear external monitors all the time.  Placing monitors inside of your uterus  (internal monitors) to record your baby's heartbeat and the frequency, length, and strength of your contractions. ? Your health care provider may use internal monitors if he or she needs more information about the strength of your contractions or your baby's heart rate. ? Internal monitors are put in place by passing a thin, flexible wire through your vagina and into your uterus. Depending on the type of monitor, it may remain in your uterus or on your baby's head until birth. ? Your health care provider will discuss the benefits and risks of internal monitoring with you and will ask for your permission before inserting the monitors.  Telemetry. This is a type of continuous monitoring that can be done with external or internal monitors. Instead of having to stay in bed, you are able to move around during telemetry. Ask your health care provider if telemetry is an option for you.  Physical exam Your health care provider may perform a physical exam. This may include:  Checking whether your baby is positioned: ? With the head toward your vagina (head-down). This is most common. ? With the head toward the top of your uterus (head-up or breech). If your baby is in a breech position, your health care provider may try to turn your baby to a head-down position so you can deliver vaginally. If it does not seem that your baby can be born vaginally, your provider may recommend surgery to deliver your baby. In rare cases, you may be able to deliver vaginally if your baby is head-up (breech delivery). ? Lying sideways (transverse). Babies that are lying sideways cannot be delivered vaginally.  Checking your cervix to determine: ? Whether it is thinning out (effacing). ? Whether it is opening up (dilating). ? How low your baby has moved into your birth canal.  What are the three stages of labor and delivery?  Normal labor and delivery is divided into the following three stages: Stage 1  Stage 1 is the  longest stage of labor, and it can last for hours or days. Stage 1 includes: ? Early labor. This is when contractions may be irregular, or regular and mild. Generally, early labor contractions are more than 10 minutes apart. ? Active labor. This is when contractions get longer, more regular, more frequent, and more intense. ? The transition phase. This is when contractions happen very close together, are very intense, and may last longer than during any other part of labor.  Contractions generally feel mild, infrequent, and irregular at first. They get stronger, more frequent (about every 2-3 minutes), and more regular as you progress from early labor through active labor and transition.  Many women progress through stage 1 naturally, but you may need help to continue making progress. If this happens, your health care provider may talk with you about: ? Rupturing your amniotic sac if it has not ruptured yet. ? Giving you medicine to help make your contractions stronger and more frequent.  Stage 1 ends when your cervix is completely dilated to 4 inches (10 cm) and completely effaced. This happens at the end of the transition phase. Stage 2  Once   your cervix is completely effaced and dilated to 4 inches (10 cm), you may start to feel an urge to push. It is common for the body to naturally take a rest before feeling the urge to push, especially if you received an epidural or certain other pain medicines. This rest period may last for up to 1-2 hours, depending on your unique labor experience.  During stage 2, contractions are generally less painful, because pushing helps relieve contraction pain. Instead of contraction pain, you may feel stretching and burning pain, especially when the widest part of your baby's head passes through the vaginal opening (crowning).  Your health care provider will closely monitor your pushing progress and your baby's progress through the vagina during stage 2.  Your  health care provider may massage the area of skin between your vaginal opening and anus (perineum) or apply warm compresses to your perineum. This helps it stretch as the baby's head starts to crown, which can help prevent perineal tearing. ? In some cases, an incision may be made in your perineum (episiotomy) to allow the baby to pass through the vaginal opening. An episiotomy helps to make the opening of the vagina larger to allow more room for the baby to fit through.  It is very important to breathe and focus so your health care provider can control the delivery of your baby's head. Your health care provider may have you decrease the intensity of your pushing, to help prevent perineal tearing.  After delivery of your baby's head, the shoulders and the rest of the body generally deliver very quickly and without difficulty.  Once your baby is delivered, the umbilical cord may be cut right away, or this may be delayed for 1-2 minutes, depending on your baby's health. This may vary among health care providers, hospitals, and birth centers.  If you and your baby are healthy enough, your baby may be placed on your chest or abdomen to help maintain the baby's temperature and to help you bond with each other. Some mothers and babies start breastfeeding at this time. Your health care team will dry your baby and help keep your baby warm during this time.  Your baby may need immediate care if he or she: ? Showed signs of distress during labor. ? Has a medical condition. ? Was born too early (prematurely). ? Had a bowel movement before birth (meconium). ? Shows signs of difficulty transitioning from being inside the uterus to being outside of the uterus. If you are planning to breastfeed, your health care team will help you begin a feeding. Stage 3  The third stage of labor starts immediately after the birth of your baby and ends after you deliver the placenta. The placenta is an organ that develops  during pregnancy to provide oxygen and nutrients to your baby in the womb.  Delivering the placenta may require some pushing, and you may have mild contractions. Breastfeeding can stimulate contractions to help you deliver the placenta.  After the placenta is delivered, your uterus should tighten (contract) and become firm. This helps to stop bleeding in your uterus. To help your uterus contract and to control bleeding, your health care provider may: ? Give you medicine by injection, through an IV tube, by mouth, or through your rectum (rectally). ? Massage your abdomen or perform a vaginal exam to remove any blood clots that are left in your uterus. ? Empty your bladder by placing a thin, flexible tube (catheter) into your bladder. ? Encourage   you to breastfeed your baby. After labor is over, you and your baby will be monitored closely to ensure that you are both healthy until you are ready to go home. Your health care team will teach you how to care for yourself and your baby. This information is not intended to replace advice given to you by your health care provider. Make sure you discuss any questions you have with your health care provider. Document Released: 05/03/2008 Document Revised: 02/12/2016 Document Reviewed: 08/09/2015 Elsevier Interactive Patient Education  2018 [employer]  

## 2017-09-11 ENCOUNTER — Encounter (HOSPITAL_COMMUNITY): Payer: Self-pay | Admitting: *Deleted

## 2017-09-11 ENCOUNTER — Inpatient Hospital Stay (HOSPITAL_COMMUNITY)
Admission: AD | Admit: 2017-09-11 | Discharge: 2017-09-11 | Disposition: A | Discharge status: Home / Self Care | Payer: Medicaid Other | Source: Ambulatory Visit | Attending: Obstetrics & Gynecology | Admitting: Obstetrics & Gynecology

## 2017-09-11 DIAGNOSIS — Z3A37 37 weeks gestation of pregnancy: Secondary | ICD-10-CM | POA: Insufficient documentation

## 2017-09-11 DIAGNOSIS — O471 False labor at or after 37 completed weeks of gestation: Secondary | ICD-10-CM

## 2017-09-11 DIAGNOSIS — N949 Unspecified condition associated with female genital organs and menstrual cycle: Secondary | ICD-10-CM

## 2017-09-11 DIAGNOSIS — O26893 Other specified pregnancy related conditions, third trimester: Secondary | ICD-10-CM | POA: Insufficient documentation

## 2017-09-11 DIAGNOSIS — Z0371 Encounter for suspected problem with amniotic cavity and membrane ruled out: Secondary | ICD-10-CM

## 2017-09-11 LAB — POCT FERN TEST: POCT Fern Test: NEGATIVE

## 2017-09-11 NOTE — Discharge Instructions (Signed)
Braxton Hicks Contractions °Contractions of the uterus can occur throughout pregnancy, but they are not always a sign that you are in labor. You may have practice contractions called Braxton Hicks contractions. These false labor contractions are sometimes confused with true labor. °What are Braxton Hicks contractions? °Braxton Hicks contractions are tightening movements that occur in the muscles of the uterus before labor. Unlike true labor contractions, these contractions do not result in opening (dilation) and thinning of the cervix. Toward the end of pregnancy (32-34 weeks), Braxton Hicks contractions can happen more often and may become stronger. These contractions are sometimes difficult to tell apart from true labor because they can be very uncomfortable. You should not feel embarrassed if you go to the hospital with false labor. °Sometimes, the only way to tell if you are in true labor is for your health care provider to look for changes in the cervix. The health care provider will do a physical exam and may monitor your contractions. If you are not in true labor, the exam should show that your cervix is not dilating and your water has not broken. °If there are other health problems associated with your pregnancy, it is completely safe for you to be sent home with false labor. You may continue to have Braxton Hicks contractions until you go into true labor. °How to tell the difference between true labor and false labor °True labor °· Contractions last 30-70 seconds. °· Contractions become very regular. °· Discomfort is usually felt in the top of the uterus, and it spreads to the lower abdomen and low back. °· Contractions do not go away with walking. °· Contractions usually become more intense and increase in frequency. °· The cervix dilates and gets thinner. °False labor °· Contractions are usually shorter and not as strong as true labor contractions. °· Contractions are usually irregular. °· Contractions  are often felt in the front of the lower abdomen and in the groin. °· Contractions may go away when you walk around or change positions while lying down. °· Contractions get weaker and are shorter-lasting as time goes on. °· The cervix usually does not dilate or become thin. °Follow these instructions at home: °· Take over-the-counter and prescription medicines only as told by your health care provider. °· Keep up with your usual exercises and follow other instructions from your health care provider. °· Eat and drink lightly if you think you are going into labor. °· If Braxton Hicks contractions are making you uncomfortable: °? Change your position from lying down or resting to walking, or change from walking to resting. °? Sit and rest in a tub of warm water. °? Drink enough fluid to keep your urine pale yellow. Dehydration may cause these contractions. °? Do slow and deep breathing several times an hour. °· Keep all follow-up prenatal visits as told by your health care provider. This is important. °Contact a health care provider if: °· You have a fever. °· You have continuous pain in your abdomen. °Get help right away if: °· Your contractions become stronger, more regular, and closer together. °· You have fluid leaking or gushing from your vagina. °· You pass blood-tinged mucus (bloody show). °· You have bleeding from your vagina. °· You have low back pain that you never had before. °· You feel your baby’s head pushing down and causing pelvic pressure. °· Your baby is not moving inside you as much as it used to. °Summary °· Contractions that occur before labor are called Braxton   Hicks contractions, false labor, or practice contractions. °· Braxton Hicks contractions are usually shorter, weaker, farther apart, and less regular than true labor contractions. True labor contractions usually become progressively stronger and regular and they become more frequent. °· Manage discomfort from Braxton Hicks contractions by  changing position, resting in a warm bath, drinking plenty of water, or practicing deep breathing. °This information is not intended to replace advice given to you by your health care provider. Make sure you discuss any questions you have with your health care provider. °Document Released: 12/08/2016 Document Revised: 12/08/2016 Document Reviewed: 12/08/2016 °Elsevier Interactive Patient Education © 2018 [employer] ° °

## 2017-09-11 NOTE — MAU Provider Note (Signed)
S: [name] is a 37 y.o. [id number] at [redacted]w[redacted]d  who presents to [facility]U today complaining of leaking of fluid for the past couple of days. States she has not had a big gush of fluid but has woke up to her panties being wet, she reports them not being soaked "like she doesn't have to change her panties but more than discharge". She denies vaginal bleeding. She endorses contractions. She reports normal fetal movement.    O: BP 126/76 (BP Location: Right Arm)   Pulse 99   Temp 98.5 F (36.9 C) (Oral)   Resp 16   Ht 5\' 5"  (1.651 m)   Wt 204 lb (92.5 kg)   LMP 12/20/2016 (Approximate)   BMI 33.95 kg/m  GENERAL: Well-developed, well-nourished female in no acute distress.  HEAD: Normocephalic, atraumatic.  CHEST: Normal effort of breathing, regular heart rate ABDOMEN: Soft, nontender, gravid PELVIC: Normal external female genitalia. Vagina is pink and rugated. Cervix with normal contour, no lesions. Normal discharge.  Negative pooling.   Cervical exam:  Dilation: 1 Effacement (%): Thick Cervical Position: Posterior Presentation: Vertex Exam by:: V. Syris Brookens CNM  Fetal Monitoring: Baseline: 130 Variability: moderate Accelerations: present  Decelerations: none Contractions: 2-3/ mild-moderate/ 60-70  A: [facility] at [redacted]w[redacted]d  Membranes intact  P: Discharge pt. Pt stable at time of discharge  Herby Abraham 09/11/2017 8:58 PM

## 2017-09-11 NOTE — MAU Note (Signed)
Pt c/o leaking today and noticed that her underwear were damp and then she applied a pad and had to change it twice today due to clear wetness. Denies any vag bleeding. Just reports some mild contractions all day today and feels baby moving.

## 2017-09-11 NOTE — MAU Note (Signed)
I have communicated with Wende Bushy CNM and reviewed vital signs:  Vitals:   09/11/17 1938  BP: 126/76  Pulse: 99  Resp: 16  Temp: 98.5 F (36.9 C)    Vaginal exam:  Dilation: 1 Effacement (%): Thick Cervical Position: Posterior Presentation: Vertex Exam by:: V. rogers CNM,   Also reviewed contraction pattern and that non-stress test is reactive.  It has been documented that patient is contracting every 2-3 minutes with cervix of 1cm, unchanged from last week in office not indicating active labor.  Patient denies any other complaints.  Based on this report provider has given order for discharge.  A discharge order and diagnosis entered by a provider.   Labor discharge instructions reviewed with patient.

## 2017-09-13 ENCOUNTER — Encounter: Payer: Medicaid Other | Admitting: Obstetrics and Gynecology

## 2017-09-14 ENCOUNTER — Other Ambulatory Visit: Payer: Medicaid Other

## 2017-09-14 ENCOUNTER — Ambulatory Visit (INDEPENDENT_AMBULATORY_CARE_PROVIDER_SITE_OTHER): Payer: Medicaid Other | Admitting: Obstetrics & Gynecology

## 2017-09-14 VITALS — BP 129/70 | HR 100 | Wt 205.3 lb

## 2017-09-14 DIAGNOSIS — Z3483 Encounter for supervision of other normal pregnancy, third trimester: Secondary | ICD-10-CM

## 2017-09-14 DIAGNOSIS — Z3480 Encounter for supervision of other normal pregnancy, unspecified trimester: Secondary | ICD-10-CM

## 2017-09-14 NOTE — Progress Notes (Signed)
Pt states having contraction, asked how far apart couldn't tell me but states they are close.

## 2017-09-14 NOTE — Progress Notes (Signed)
   PRENATAL VISIT NOTE  Subjective:  Christy [name] is a 37 y.o. Q6P6195 at [redacted]w[redacted]d being seen today for ongoing prenatal care.  She is currently monitored for the following issues for this low-risk pregnancy and has Encounter for supervision of multigravida with advanced maternal age; History of severe pre-eclampsia; Short interval between pregnancies affecting pregnancy, antepartum; Supervision of other normal pregnancy, antepartum; History of prior pregnancy with small for gestational age newborn; Anemia complicating pregnancy; Noncompliance; and Pelvic pressure in pregnancy, antepartum, third trimester on their problem list.  Patient reports occasional contractions.  Contractions: Regular. Vag. Bleeding: None.  Movement: Present. Denies leaking of fluid.   The following portions of the patient's history were reviewed and updated as appropriate: allergies, current medications, past family history, past medical history, past social history, past surgical history and problem list. Problem list updated.  Objective:   Vitals:   09/14/17 1203  BP: 129/70  Pulse: 100  Weight: 205 lb 4.8 oz (93.1 kg)    Fetal Status: Fetal Heart Rate (bpm): 169   Movement: Present     General:  Alert, oriented and cooperative. Patient is in no acute distress.  Skin: Skin is warm and dry. No rash noted.   Cardiovascular: Normal heart rate noted  Respiratory: Normal respiratory effort, no problems with respiration noted  Abdomen: Soft, gravid, appropriate for gestational age.  Pain/Pressure: Present     Pelvic: Cervical exam performed        Extremities: Normal range of motion.  Edema: Trace  Mental Status:  Normal mood and affect. Normal behavior. Normal judgment and thought content.   Assessment and Plan:  Pregnancy: [id number] at [redacted]w[redacted]d  There are no diagnoses linked to this encounter. Term labor symptoms and general obstetric precautions including but not limited to vaginal bleeding, contractions, leaking  of fluid and fetal movement were reviewed in detail with the patient. Please refer to After Visit Summary for other counseling recommendations.  Return in about 1 week (around 09/21/2017).   Emeterio Reeve, MD

## 2017-09-14 NOTE — Patient Instructions (Signed)
Vaginal Delivery Vaginal delivery means that you will give birth by pushing your baby out of your birth canal (vagina). A team of health care providers will help you before, during, and after vaginal delivery. Birth experiences are unique for every woman and every pregnancy, and birth experiences vary depending on where you choose to give birth. What should I do to prepare for my baby's birth? Before your baby is born, it is important to talk with your health care provider about:  Your labor and delivery preferences. These may include: ? Medicines that you may be given. ? How you will manage your pain. This might include non-medical pain relief techniques or injectable pain relief such as epidural analgesia. ? How you and your baby will be monitored during labor and delivery. ? Who may be in the labor and delivery room with you. ? Your feelings about surgical delivery of your baby (cesarean delivery, or C-section) if this becomes necessary. ? Your feelings about receiving donated blood through an IV tube (blood transfusion) if this becomes necessary.  Whether you are able: ? To take pictures or videos of the birth. ? To eat during labor and delivery. ? To move around, walk, or change positions during labor and delivery.  What to expect after your baby is born, such as: ? Whether delayed umbilical cord clamping and cutting is offered. ? Who will care for your baby right after birth. ? Medicines or tests that may be recommended for your baby. ? Whether breastfeeding is supported in your hospital or birth center. ? How long you will be in the hospital or birth center.  How any medical conditions you have may affect your baby or your labor and delivery experience.  To prepare for your baby's birth, you should also:  Attend all of your health care visits before delivery (prenatal visits) as recommended by your health care provider. This is important.  Prepare your home for your baby's  arrival. Make sure that you have: ? Diapers. ? Baby clothing. ? Feeding equipment. ? Safe sleeping arrangements for you and your baby.  Install a car seat in your vehicle. Have your car seat checked by a certified car seat installer to make sure that it is installed safely.  Think about who will help you with your new baby at home for at least the first several weeks after delivery.  What can I expect when I arrive at the birth center or hospital? Once you are in labor and have been admitted into the hospital or birth center, your health care provider may:  Review your pregnancy history and any concerns you have.  Insert an IV tube into one of your veins. This is used to give you fluids and medicines.  Check your blood pressure, pulse, temperature, and heart rate (vital signs).  Check whether your bag of water (amniotic sac) has broken (ruptured).  Talk with you about your birth plan and discuss pain control options.  Monitoring Your health care provider may monitor your contractions (uterine monitoring) and your baby's heart rate (fetal monitoring). You may need to be monitored:  Often, but not continuously (intermittently).  All the time or for long periods at a time (continuously). Continuous monitoring may be needed if: ? You are taking certain medicines, such as medicine to relieve pain or make your contractions stronger. ? You have pregnancy or labor complications.  Monitoring may be done by:  Placing a special stethoscope or a handheld monitoring device on your abdomen to   check your baby's heartbeat, and feeling your abdomen for contractions. This method of monitoring does not continuously record your baby's heartbeat or your contractions.  Placing monitors on your abdomen (external monitors) to record your baby's heartbeat and the frequency and length of contractions. You may not have to wear external monitors all the time.  Placing monitors inside of your uterus  (internal monitors) to record your baby's heartbeat and the frequency, length, and strength of your contractions. ? Your health care provider may use internal monitors if he or she needs more information about the strength of your contractions or your baby's heart rate. ? Internal monitors are put in place by passing a thin, flexible wire through your vagina and into your uterus. Depending on the type of monitor, it may remain in your uterus or on your baby's head until birth. ? Your health care provider will discuss the benefits and risks of internal monitoring with you and will ask for your permission before inserting the monitors.  Telemetry. This is a type of continuous monitoring that can be done with external or internal monitors. Instead of having to stay in bed, you are able to move around during telemetry. Ask your health care provider if telemetry is an option for you.  Physical exam Your health care provider may perform a physical exam. This may include:  Checking whether your baby is positioned: ? With the head toward your vagina (head-down). This is most common. ? With the head toward the top of your uterus (head-up or breech). If your baby is in a breech position, your health care provider may try to turn your baby to a head-down position so you can deliver vaginally. If it does not seem that your baby can be born vaginally, your provider may recommend surgery to deliver your baby. In rare cases, you may be able to deliver vaginally if your baby is head-up (breech delivery). ? Lying sideways (transverse). Babies that are lying sideways cannot be delivered vaginally.  Checking your cervix to determine: ? Whether it is thinning out (effacing). ? Whether it is opening up (dilating). ? How low your baby has moved into your birth canal.  What are the three stages of labor and delivery?  Normal labor and delivery is divided into the following three stages: Stage 1  Stage 1 is the  longest stage of labor, and it can last for hours or days. Stage 1 includes: ? Early labor. This is when contractions may be irregular, or regular and mild. Generally, early labor contractions are more than 10 minutes apart. ? Active labor. This is when contractions get longer, more regular, more frequent, and more intense. ? The transition phase. This is when contractions happen very close together, are very intense, and may last longer than during any other part of labor.  Contractions generally feel mild, infrequent, and irregular at first. They get stronger, more frequent (about every 2-3 minutes), and more regular as you progress from early labor through active labor and transition.  Many women progress through stage 1 naturally, but you may need help to continue making progress. If this happens, your health care provider may talk with you about: ? Rupturing your amniotic sac if it has not ruptured yet. ? Giving you medicine to help make your contractions stronger and more frequent.  Stage 1 ends when your cervix is completely dilated to 4 inches (10 cm) and completely effaced. This happens at the end of the transition phase. Stage 2  Once   your cervix is completely effaced and dilated to 4 inches (10 cm), you may start to feel an urge to push. It is common for the body to naturally take a rest before feeling the urge to push, especially if you received an epidural or certain other pain medicines. This rest period may last for up to 1-2 hours, depending on your unique labor experience.  During stage 2, contractions are generally less painful, because pushing helps relieve contraction pain. Instead of contraction pain, you may feel stretching and burning pain, especially when the widest part of your baby's head passes through the vaginal opening (crowning).  Your health care provider will closely monitor your pushing progress and your baby's progress through the vagina during stage 2.  Your  health care provider may massage the area of skin between your vaginal opening and anus (perineum) or apply warm compresses to your perineum. This helps it stretch as the baby's head starts to crown, which can help prevent perineal tearing. ? In some cases, an incision may be made in your perineum (episiotomy) to allow the baby to pass through the vaginal opening. An episiotomy helps to make the opening of the vagina larger to allow more room for the baby to fit through.  It is very important to breathe and focus so your health care provider can control the delivery of your baby's head. Your health care provider may have you decrease the intensity of your pushing, to help prevent perineal tearing.  After delivery of your baby's head, the shoulders and the rest of the body generally deliver very quickly and without difficulty.  Once your baby is delivered, the umbilical cord may be cut right away, or this may be delayed for 1-2 minutes, depending on your baby's health. This may vary among health care providers, hospitals, and birth centers.  If you and your baby are healthy enough, your baby may be placed on your chest or abdomen to help maintain the baby's temperature and to help you bond with each other. Some mothers and babies start breastfeeding at this time. Your health care team will dry your baby and help keep your baby warm during this time.  Your baby may need immediate care if he or she: ? Showed signs of distress during labor. ? Has a medical condition. ? Was born too early (prematurely). ? Had a bowel movement before birth (meconium). ? Shows signs of difficulty transitioning from being inside the uterus to being outside of the uterus. If you are planning to breastfeed, your health care team will help you begin a feeding. Stage 3  The third stage of labor starts immediately after the birth of your baby and ends after you deliver the placenta. The placenta is an organ that develops  during pregnancy to provide oxygen and nutrients to your baby in the womb.  Delivering the placenta may require some pushing, and you may have mild contractions. Breastfeeding can stimulate contractions to help you deliver the placenta.  After the placenta is delivered, your uterus should tighten (contract) and become firm. This helps to stop bleeding in your uterus. To help your uterus contract and to control bleeding, your health care provider may: ? Give you medicine by injection, through an IV tube, by mouth, or through your rectum (rectally). ? Massage your abdomen or perform a vaginal exam to remove any blood clots that are left in your uterus. ? Empty your bladder by placing a thin, flexible tube (catheter) into your bladder. ? Encourage   you to breastfeed your baby. After labor is over, you and your baby will be monitored closely to ensure that you are both healthy until you are ready to go home. Your health care team will teach you how to care for yourself and your baby. This information is not intended to replace advice given to you by your health care provider. Make sure you discuss any questions you have with your health care provider. Document Released: 05/03/2008 Document Revised: 02/12/2016 Document Reviewed: 08/09/2015 Elsevier Interactive Patient Education  2018 [employer]  

## 2017-09-15 ENCOUNTER — Inpatient Hospital Stay (HOSPITAL_COMMUNITY)
Admission: AD | Admit: 2017-09-15 | Discharge: 2017-09-15 | Disposition: A | Discharge status: Home / Self Care | Payer: Medicaid Other | Source: Ambulatory Visit | Attending: Obstetrics and Gynecology | Admitting: Obstetrics and Gynecology

## 2017-09-15 ENCOUNTER — Encounter (HOSPITAL_COMMUNITY): Payer: Self-pay | Admitting: Emergency Medicine

## 2017-09-15 ENCOUNTER — Encounter (HOSPITAL_COMMUNITY): Payer: Self-pay | Admitting: *Deleted

## 2017-09-15 ENCOUNTER — Other Ambulatory Visit: Payer: Self-pay

## 2017-09-15 ENCOUNTER — Inpatient Hospital Stay (HOSPITAL_COMMUNITY)
Admission: AD | Admit: 2017-09-15 | Discharge: 2017-09-16 | Disposition: A | Discharge status: Home / Self Care | Payer: Medicaid Other | Source: Ambulatory Visit | Attending: Obstetrics & Gynecology | Admitting: Obstetrics & Gynecology

## 2017-09-15 DIAGNOSIS — G44209 Tension-type headache, unspecified, not intractable: Secondary | ICD-10-CM

## 2017-09-15 DIAGNOSIS — Z3A38 38 weeks gestation of pregnancy: Secondary | ICD-10-CM | POA: Insufficient documentation

## 2017-09-15 DIAGNOSIS — R102 Pelvic and perineal pain: Secondary | ICD-10-CM

## 2017-09-15 DIAGNOSIS — Z882 Allergy status to sulfonamides status: Secondary | ICD-10-CM | POA: Diagnosis not present

## 2017-09-15 DIAGNOSIS — O479 False labor, unspecified: Secondary | ICD-10-CM

## 2017-09-15 DIAGNOSIS — Z881 Allergy status to other antibiotic agents status: Secondary | ICD-10-CM | POA: Insufficient documentation

## 2017-09-15 DIAGNOSIS — Z883 Allergy status to other anti-infective agents status: Secondary | ICD-10-CM | POA: Diagnosis not present

## 2017-09-15 DIAGNOSIS — N949 Unspecified condition associated with female genital organs and menstrual cycle: Secondary | ICD-10-CM

## 2017-09-15 DIAGNOSIS — O26893 Other specified pregnancy related conditions, third trimester: Secondary | ICD-10-CM

## 2017-09-15 DIAGNOSIS — O471 False labor at or after 37 completed weeks of gestation: Secondary | ICD-10-CM | POA: Diagnosis not present

## 2017-09-15 DIAGNOSIS — R197 Diarrhea, unspecified: Secondary | ICD-10-CM | POA: Diagnosis present

## 2017-09-15 DIAGNOSIS — R51 Headache: Secondary | ICD-10-CM | POA: Diagnosis present

## 2017-09-15 DIAGNOSIS — O4703 False labor before 37 completed weeks of gestation, third trimester: Secondary | ICD-10-CM | POA: Diagnosis not present

## 2017-09-15 LAB — PROTEIN / CREATININE RATIO, URINE
Creatinine, Urine: 139 mg/dL
PROTEIN CREATININE RATIO: 0.1 mg/mg{creat} (ref 0.00–0.15)
TOTAL PROTEIN, URINE: 14 mg/dL

## 2017-09-15 LAB — CBC
HEMATOCRIT: 32.7 % — AB (ref 36.0–46.0)
Hemoglobin: 10.7 g/dL — ABNORMAL LOW (ref 12.0–15.0)
MCH: 25.8 pg — ABNORMAL LOW (ref 26.0–34.0)
MCHC: 32.7 g/dL (ref 30.0–36.0)
MCV: 78.8 fL (ref 78.0–100.0)
PLATELETS: 237 10*3/uL (ref 150–400)
RBC: 4.15 MIL/uL (ref 3.87–5.11)
RDW: 17.3 % — AB (ref 11.5–15.5)
WBC: 6.4 10*3/uL (ref 4.0–10.5)

## 2017-09-15 LAB — COMPREHENSIVE METABOLIC PANEL
ALT: 19 U/L (ref 14–54)
ANION GAP: 10 (ref 5–15)
AST: 27 U/L (ref 15–41)
Albumin: 3.1 g/dL — ABNORMAL LOW (ref 3.5–5.0)
Alkaline Phosphatase: 217 U/L — ABNORMAL HIGH (ref 38–126)
BILIRUBIN TOTAL: 0.4 mg/dL (ref 0.3–1.2)
BUN: 5 mg/dL — AB (ref 6–20)
CHLORIDE: 104 mmol/L (ref 101–111)
CO2: 18 mmol/L — ABNORMAL LOW (ref 22–32)
Calcium: 8.7 mg/dL — ABNORMAL LOW (ref 8.9–10.3)
Creatinine, Ser: 0.66 mg/dL (ref 0.44–1.00)
Glucose, Bld: 79 mg/dL (ref 65–99)
POTASSIUM: 3.2 mmol/L — AB (ref 3.5–5.1)
Sodium: 132 mmol/L — ABNORMAL LOW (ref 135–145)
TOTAL PROTEIN: 7 g/dL (ref 6.5–8.1)

## 2017-09-15 NOTE — Discharge Instructions (Signed)
Braxton Hicks Contractions °Contractions of the uterus can occur throughout pregnancy, but they are not always a sign that you are in labor. You may have practice contractions called Braxton Hicks contractions. These false labor contractions are sometimes confused with true labor. °What are Braxton Hicks contractions? °Braxton Hicks contractions are tightening movements that occur in the muscles of the uterus before labor. Unlike true labor contractions, these contractions do not result in opening (dilation) and thinning of the cervix. Toward the end of pregnancy (32-34 weeks), Braxton Hicks contractions can happen more often and may become stronger. These contractions are sometimes difficult to tell apart from true labor because they can be very uncomfortable. You should not feel embarrassed if you go to the hospital with false labor. °Sometimes, the only way to tell if you are in true labor is for your health care provider to look for changes in the cervix. The health care provider will do a physical exam and may monitor your contractions. If you are not in true labor, the exam should show that your cervix is not dilating and your water has not broken. °If there are other health problems associated with your pregnancy, it is completely safe for you to be sent home with false labor. You may continue to have Braxton Hicks contractions until you go into true labor. °How to tell the difference between true labor and false labor °True labor °· Contractions last 30-70 seconds. °· Contractions become very regular. °· Discomfort is usually felt in the top of the uterus, and it spreads to the lower abdomen and low back. °· Contractions do not go away with walking. °· Contractions usually become more intense and increase in frequency. °· The cervix dilates and gets thinner. °False labor °· Contractions are usually shorter and not as strong as true labor contractions. °· Contractions are usually irregular. °· Contractions  are often felt in the front of the lower abdomen and in the groin. °· Contractions may go away when you walk around or change positions while lying down. °· Contractions get weaker and are shorter-lasting as time goes on. °· The cervix usually does not dilate or become thin. °Follow these instructions at home: °· Take over-the-counter and prescription medicines only as told by your health care provider. °· Keep up with your usual exercises and follow other instructions from your health care provider. °· Eat and drink lightly if you think you are going into labor. °· If Braxton Hicks contractions are making you uncomfortable: °? Change your position from lying down or resting to walking, or change from walking to resting. °? Sit and rest in a tub of warm water. °? Drink enough fluid to keep your urine pale yellow. Dehydration may cause these contractions. °? Do slow and deep breathing several times an hour. °· Keep all follow-up prenatal visits as told by your health care provider. This is important. °Contact a health care provider if: °· You have a fever. °· You have continuous pain in your abdomen. °Get help right away if: °· Your contractions become stronger, more regular, and closer together. °· You have fluid leaking or gushing from your vagina. °· You pass blood-tinged mucus (bloody show). °· You have bleeding from your vagina. °· You have low back pain that you never had before. °· You feel your baby’s head pushing down and causing pelvic pressure. °· Your baby is not moving inside you as much as it used to. °Summary °· Contractions that occur before labor are called Braxton   Hicks contractions, false labor, or practice contractions. °· Braxton Hicks contractions are usually shorter, weaker, farther apart, and less regular than true labor contractions. True labor contractions usually become progressively stronger and regular and they become more frequent. °· Manage discomfort from Braxton Hicks contractions by  changing position, resting in a warm bath, drinking plenty of water, or practicing deep breathing. °This information is not intended to replace advice given to you by your health care provider. Make sure you discuss any questions you have with your health care provider. °Document Released: 12/08/2016 Document Revised: 12/08/2016 Document Reviewed: 12/08/2016 °Elsevier Interactive Patient Education © 2018 [employer] ° °

## 2017-09-15 NOTE — MAU Note (Signed)
Pt presents with c/o ctxs that began last night @ 2100, reports ctxs are 2-5 minutes apart.  Denies VB or LOF.  Reports has had diarrhea 3-4 times in 24 hours.  Also states has a H/A that started this morning, unrelieved with Tylenol.

## 2017-09-15 NOTE — Progress Notes (Signed)
Pt left unit prior to discharge instructions being reviewed & given.

## 2017-09-15 NOTE — MAU Provider Note (Signed)
Chief Complaint:  Contractions; Headache; and Diarrhea   None     HPI: Christy [name] is a 37 y.o. [id number] at [redacted]w[redacted]d who presents to MAU reporting contractions.  States that her contractions started last night.  Feels like contractions are every 2-4 minutes apart.  Patient also endorsing headache, diarrhea, and swelling.  She is worried about preeclampsia as she was induced with her prior pregnancy for preeclampsia.  States that she has tried Tylenol for her headache without relief.  Rates her pain currently 6 out of 10.  Headache is constant.  Patient has associated vision changes but states that they are associated with bright lights due to her headache.  Patient mentions multiple times that she is ready to have this baby. Endorsing some loose stools but not true diarrhea.  Denies leakage of fluid, vaginal discharge, or vaginal bleeding. Good fetal movement.   Pregnancy Course:  Patient Active Problem List   Diagnosis Date Noted  . Pelvic pressure in pregnancy, antepartum, third trimester 08/25/2017  . Noncompliance 07/26/2017  . Anemia complicating pregnancy 45/40/9811  . History of prior pregnancy with small for gestational age newborn 06/05/2017  . Supervision of other normal pregnancy, antepartum 05/01/2017  . Short interval between pregnancies affecting pregnancy, antepartum 03/15/2017  . History of severe pre-eclampsia 03/27/2016  . Encounter for supervision of multigravida with advanced maternal age 17/03/2016    Past Medical History: Past Medical History:  Diagnosis Date  . Anemia   . Preeclampsia, severe 03/29/2016    Past obstetric history: OB History  Gravida Para Term Preterm AB Living  9 7 6 1 1 7   SAB TAB Ectopic Multiple Live Births  1 0 0 0 7    # Outcome Date GA Lbr Len/2nd Weight Sex Delivery Anes PTL Lv  9 Current           8 Preterm 03/30/16 [redacted]w[redacted]d 05:45 / 00:17 2.065 kg (4 lb 8.8 oz) M Vag-Spont EPI  LIV     Complications: Severe preeclampsia     Birth  Comments: extra digits bilateral hands  7 Term 09/17/10 [redacted]w[redacted]d  2.835 kg (6 lb 4 oz) M Vag-Spont EPI N LIV  6 Term 08/17/07 [redacted]w[redacted]d  3.175 kg (7 lb) F Vag-Spont  N LIV  5 Term 11/10/04 [redacted]w[redacted]d  3.402 kg (7 lb 8 oz) M Vag-Spont None N LIV  4 Term 07/23/01 [redacted]w[redacted]d  3.345 kg (7 lb 6 oz) M Vag-Spont None N LIV  3 Term 05/21/99 [redacted]w[redacted]d  3.289 kg (7 lb 4 oz) M Vag-Spont None N LIV  2 Term 05/30/97 [redacted]w[redacted]d  2.948 kg (6 lb 8 oz) F Vag-Spont EPI N LIV  1 SAB             Obstetric Comments  03/30/2016 Induced for severe preeclampsia    Past Surgical History: Past Surgical History:  Procedure Laterality Date  . NO PAST SURGERIES       Family History: Family History  Problem Relation Age of Onset  . Hypertension Mother   . Hypertension Father   . Heart attack Father   . Stroke Father   . Hypertension Sister   . Hypertension Maternal Grandmother     Social History: Social History   Tobacco Use  . Smoking status: Never Smoker  . Smokeless tobacco: Never Used  Substance Use Topics  . Alcohol use: No  . Drug use: No    Allergies:  Allergies  Allergen Reactions  . Diflucan [Fluconazole]     Mouth and vagina  area breaks out into hives  . Sulfamethoxazole-Trimethoprim Hives  . Latex Rash  . Metronidazole Rash    If takes longer than 5 days will develop a rash    Meds:  No medications prior to admission.    I have reviewed patient's Past Medical Hx, Surgical Hx, Family Hx, Social Hx, medications and allergies.   ROS:  All systems reviewed and are negative for acute change except as noted in the HPI.   Physical Exam   Patient Vitals for the past 24 hrs:  BP Temp Temp src Pulse Resp SpO2 Height Weight  09/15/17 1240 110/69 98.4 F (36.9 C) Oral (!) 102 20 100 % - -  09/15/17 1100 125/72 98.5 F (36.9 C) Oral (!) 118 18 99 % 5\' 5"  (1.651 m) 93 kg (205 lb)  09/15/17 1055 - - - - - 99 % - -   Constitutional: Well-developed, well-nourished female in no acute distress.   Cardiovascular: normal rate and rhythm, pulses intact Respiratory: normal rate and effort.  GI: Abd soft, non-tender, gravid appropriate for gestational age.  MS: Extremities nontender, no edema, normal ROM Neurologic: Alert and oriented x 4. CN grossly intact. Pelvic: NEFG, physiologic discharge, no blood, cervix clean. No CMT Psych: irritable demeanor  Dilation: 2.5 Effacement (%): 50 Cervical Position: Posterior Station: Ballotable Presentation: Vertex Exam by:: Dr. Gerarda Fraction  Labs: Results for orders placed or performed during the hospital encounter of 09/15/17 (from the past 24 hour(s))  Protein / creatinine ratio, urine     Status: None   Collection Time: 09/15/17 10:33 AM  Result Value Ref Range   Creatinine, Urine 139.00 mg/dL   Total Protein, Urine 14 mg/dL   Protein Creatinine Ratio 0.10 0.00 - 0.15 mg/mg[Cre]  Comprehensive metabolic panel     Status: Abnormal   Collection Time: 09/15/17 11:32 AM  Result Value Ref Range   Sodium 132 (L) 135 - 145 mmol/L   Potassium 3.2 (L) 3.5 - 5.1 mmol/L   Chloride 104 101 - 111 mmol/L   CO2 18 (L) 22 - 32 mmol/L   Glucose, Bld 79 65 - 99 mg/dL   BUN 5 (L) 6 - 20 mg/dL   Creatinine, Ser 0.66 0.44 - 1.00 mg/dL   Calcium 8.7 (L) 8.9 - 10.3 mg/dL   Total Protein 7.0 6.5 - 8.1 g/dL   Albumin 3.1 (L) 3.5 - 5.0 g/dL   AST 27 15 - 41 U/L   ALT 19 14 - 54 U/L   Alkaline Phosphatase 217 (H) 38 - 126 U/L   Total Bilirubin 0.4 0.3 - 1.2 mg/dL   GFR calc non Af Amer >60 >60 mL/min   GFR calc Af Amer >60 >60 mL/min   Anion gap 10 5 - 15  CBC     Status: Abnormal   Collection Time: 09/15/17 11:32 AM  Result Value Ref Range   WBC 6.4 4.0 - 10.5 K/uL   RBC 4.15 3.87 - 5.11 MIL/uL   Hemoglobin 10.7 (L) 12.0 - 15.0 g/dL   HCT 32.7 (L) 36.0 - 46.0 %   MCV 78.8 78.0 - 100.0 fL   MCH 25.8 (L) 26.0 - 34.0 pg   MCHC 32.7 30.0 - 36.0 g/dL   RDW 17.3 (H) 11.5 - 15.5 %   Platelets 237 150 - 400 K/uL    Imaging:  US Fetal Bpp  W/nonstress  Result Date: 08/24/2017 ----------------------------------------------------------------------  OBSTETRICS REPORT                      (  Signed Final 08/24/2017 03:00 pm) ---------------------------------------------------------------------- Patient Info  ID #:       [id number]                          D.O.B.:  1981-08-04 (36 yrs)  Name:       AURELIA GRAS Rexrode               Visit Date: 08/23/2017 10:38 am ---------------------------------------------------------------------- Performed By  Performed By:     Shauna Hugh Day RNC          Ref. Address:     [place], [place]  Attending:        Emeterio Reeve MD        [facility]                                                             [facility]  Referred By:      Woodroe Mode                    MD ---------------------------------------------------------------------- Orders   #  Description                                 Code   1  US FETAL BPP W/NONSTRESS                    [phone]  ----------------------------------------------------------------------   #  Ordered By               Order #        Accession #    Episode #   1  JAMES ARNOLD  [id number]      [id number]     [id number]  ---------------------------------------------------------------------- Service(s) Provided   US Fetal BPP W NST                                   [phone]  ---------------------------------------------------------------------- Indications   [redacted] weeks gestation of pregnancy                Z3A.34   Polyhydramnios, third trimester, antepartum    O40.3XX0   condition or complication, unspecified fetus  ---------------------------------------------------------------------- OB History   Gravidity:    9         Term:   7        Prem:   0        SAB:   1  TOP:          0       Ectopic:  0        Living: 7 ---------------------------------------------------------------------- Fetal Evaluation  Num Of Fetuses:     1  Preg. Location:     Intrauterine  Cardiac Activity:   Observed  Presentation:       Cephalic  Amniotic Fluid  AFI FV:      Subjectively within normal limits  AFI Sum(cm)     %Tile       Largest Pocket(cm)  16.86           62          6.04  RUQ(cm)       RLQ(cm)       LUQ(cm)        LLQ(cm)  4.85          4             6.04           1.97 ---------------------------------------------------------------------- Biophysical Evaluation  Amniotic F.V:   Pocket => 2 cm two         F. Tone:        Observed                  planes  F. Movement:    Observed                   N.S.T:          Reactive  F. Breathing:   Observed                   Score:          10/10 ---------------------------------------------------------------------- Gestational Age  Best:          34w 3d     Det. ByLoman Chroman         EDD:   10/01/17                                      (02/22/17) ---------------------------------------------------------------------- Impression  Normal amniotic fluid volume. Intrauterine pregnancy at 34.3  weeks with BPP 8/8 ---------------------------------------------------------------------- Recommendations  Follow-up ultrasounds as clinically indicated. Amniotic fluid in  normal and scheduled surveillance is no longer indicated ----------------------------------------------------------------------                 Emeterio Reeve, MD Electronically Signed Final Report   08/24/2017 03:00 pm ----------------------------------------------------------------------  US Fetal Bpp W/nonstress  Result Date: 08/23/2017 ----------------------------------------------------------------------  OBSTETRICS REPORT                      (  Signed Final 08/23/2017 05:21 pm)  ---------------------------------------------------------------------- Patient Info  ID #:       [id number]                          D.O.B.:  05-16-1981 (36 yrs)  Name:       LATIANA TOMEI Clonch               Visit Date: 08/18/2017 02:45 pm ---------------------------------------------------------------------- Performed By  Performed By:     Shauna Hugh Day RNC          Ref. Address:     [place], [place]  Attending:        Vivien Rota MD         Location[facility]         [facility]                                                             [facility]  Referred By:      Woodroe Mode                    MD ---------------------------------------------------------------------- Orders   #  Description                                 Code   1  US FETAL BPP W/NONSTRESS                    [phone]  ----------------------------------------------------------------------   #  Ordered By               Order #        Accession #    Episode #   1  KELLY DAVIS  [id number]      [id number]     [id number]  ---------------------------------------------------------------------- Indications   [redacted] weeks gestation of pregnancy                Z3A.33   Polyhydramnios, third trimester, antepartum    O40.3XX0   condition or complication, unspecified fetus  ---------------------------------------------------------------------- OB History  Gravidity:    9         Term:   7        Prem:   0        SAB:   1  TOP:          0       Ectopic:  0        Living: 7 ---------------------------------------------------------------------- Fetal Evaluation  Num Of Fetuses:     1  Preg. Location:     Intrauterine  Cardiac Activity:   Observed  Presentation:       Cephalic  Amniotic Fluid   AFI FV:      Subjectively within normal limits  AFI Sum(cm)     %Tile       Largest Pocket(cm)  17.53           64          5.52  RUQ(cm)       RLQ(cm)       LUQ(cm)        LLQ(cm)  3.71          3.25          5.52           5.05 ---------------------------------------------------------------------- Biophysical Evaluation  Amniotic F.V:   Pocket => 2 cm two         F. Tone:        Observed                  planes  F. Movement:    Observed                   N.S.T:          Reactive  F. Breathing:   Not Observed               Score:          8/10 ---------------------------------------------------------------------- Gestational Age  Best:          33w 5d     Det. ByLoman Chroman         EDD:   10/01/17                                      (02/22/17) ---------------------------------------------------------------------- Impression  BPP 8/10 ---------------------------------------------------------------------- Recommendations  Cont weekly testing ----------------------------------------------------------------------                   Vivien Rota, MD Electronically Signed Final Report   08/23/2017 05:21 pm ----------------------------------------------------------------------   MAU Course: Vitals and nursing notes reviewed I have ordered labs and reviewed them PIH labs normal BPs wnl; no elevated pressures Had minimal cervical change after 1hr observation in MAU  Treatments given in MAU: Declined headache medicine  I personally reviewed the patient's NST today, found to be REACTIVE. 130 bpm, mod var, +accels, no decels. CTX: 5 min.  MDM: Plan of care reviewed with patient, including labs and tests ordered and medical treatment.   Assessment: 1. False labor   2. Pelvic  pressure in pregnancy, antepartum, third trimester   3. Acute non intractable tension-type headache     Plan: Discharge home in stable condition.  Labor precautions and fetal kick counts reviewed Handout given Follow-up  with OB provider or return to MAU if symptoms worsen   Luiz Blare, DO [place] [facility], [facility] 09/15/2017 1:39 PM

## 2017-09-15 NOTE — MAU Note (Addendum)
Was here earlier today for labor ck and sent home. Ctxs closer and stronger. No bleeding but mucousy d/c,.Diarrhea all day

## 2017-09-16 NOTE — Discharge Instructions (Signed)
If your contractions get stronger or closer together or you become more uncomfortable at home please, have leakage of fluid, or bleeding come back to MAU.   Braxton Hicks Contractions Contractions of the uterus can occur throughout pregnancy, but they are not always a sign that you are in labor. You may have practice contractions called Braxton Hicks contractions. These false labor contractions are sometimes confused with true labor. What are [place] contractions? Braxton Hicks contractions are tightening movements that occur in the muscles of the uterus before labor. Unlike true labor contractions, these contractions do not result in opening (dilation) and thinning of the cervix. Toward the end of pregnancy (32-34 weeks), Braxton Hicks contractions can happen more often and may become stronger. These contractions are sometimes difficult to tell apart from true labor because they can be very uncomfortable. You should not feel embarrassed if you go to the hospital with false labor. Sometimes, the only way to tell if you are in true labor is for your health care provider to look for changes in the cervix. The health care provider will do a physical exam and may monitor your contractions. If you are not in true labor, the exam should show that your cervix is not dilating and your water has not broken. If there are other health problems associated with your pregnancy, it is completely safe for you to be sent home with false labor. You may continue to have Braxton Hicks contractions until you go into true labor. How to tell the difference between true labor and false labor True labor  Contractions last 30-70 seconds.  Contractions become very regular.  Discomfort is usually felt in the top of the uterus, and it spreads to the lower abdomen and low back.  Contractions do not go away with walking.  Contractions usually become more intense and increase in frequency.  The cervix dilates and gets  thinner. False labor  Contractions are usually shorter and not as strong as true labor contractions.  Contractions are usually irregular.  Contractions are often felt in the front of the lower abdomen and in the groin.  Contractions may go away when you walk around or change positions while lying down.  Contractions get weaker and are shorter-lasting as time goes on.  The cervix usually does not dilate or become thin. Follow these instructions at home:  Take over-the-counter and prescription medicines only as told by your health care provider.  Keep up with your usual exercises and follow other instructions from your health care provider.  Eat and drink lightly if you think you are going into labor.  If Braxton Hicks contractions are making you uncomfortable: ? Change your position from lying down or resting to walking, or change from walking to resting. ? Sit and rest in a tub of warm water. ? Drink enough fluid to keep your urine pale yellow. Dehydration may cause these contractions. ? Do slow and deep breathing several times an hour.  Keep all follow-up prenatal visits as told by your health care provider. This is important. Contact a health care provider if:  You have a fever.  You have continuous pain in your abdomen. Get help right away if:  Your contractions become stronger, more regular, and closer together.  You have fluid leaking or gushing from your vagina.  You pass blood-tinged mucus (bloody show).  You have bleeding from your vagina.  You have low back pain that you never had before.  You feel your babys head pushing down  and causing pelvic pressure.  Your baby is not moving inside you as much as it used to. Summary  Contractions that occur before labor are called Braxton Hicks contractions, false labor, or practice contractions.  Braxton Hicks contractions are usually shorter, weaker, farther apart, and less regular than true labor contractions.  True labor contractions usually become progressively stronger and regular and they become more frequent.  Manage discomfort from [place] contractions by changing position, resting in a warm bath, drinking plenty of water, or practicing deep breathing. This information is not intended to replace advice given to you by your health care provider. Make sure you discuss any questions you have with your health care provider. Document Released: 12/08/2016 Document Revised: 12/08/2016 Document Reviewed: 12/08/2016 Elsevier Interactive Patient Education  2018 [employer].

## 2017-09-16 NOTE — MAU Note (Signed)
I have communicated with Dr. Tammi Klippel and reviewed vital signs:  Vitals:   09/15/17 2150 09/16/17 0100  BP: 127/69 126/72  Pulse: (!) 103 89  Resp: 18 17  Temp: 98.5 F (36.9 C) 98.5 F (36.9 C)  SpO2:  100%    Vaginal exam:  Dilation: 3 Effacement (%): 50 Cervical Position: Middle Station: Ballotable Presentation: Vertex Exam by:: Kaiden Pech, rn ,   Also reviewed contraction pattern and that non-stress test is reactive.  It has been documented that patient is contracting every 5 minutes with no cervical change over 2 hours not indicating active labor.  Patient denies any other complaints.  Based on this report provider has given order for discharge.  A discharge order and diagnosis entered by a provider.   Labor discharge instructions reviewed with patient.

## 2017-09-20 ENCOUNTER — Other Ambulatory Visit: Payer: Medicaid Other

## 2017-09-20 ENCOUNTER — Encounter: Payer: Medicaid Other | Admitting: Obstetrics and Gynecology

## 2017-09-20 ENCOUNTER — Ambulatory Visit (INDEPENDENT_AMBULATORY_CARE_PROVIDER_SITE_OTHER): Payer: Medicaid Other | Admitting: Family Medicine

## 2017-09-20 VITALS — BP 125/69 | HR 103 | Wt 205.0 lb

## 2017-09-20 DIAGNOSIS — O09529 Supervision of elderly multigravida, unspecified trimester: Secondary | ICD-10-CM

## 2017-09-20 DIAGNOSIS — O09523 Supervision of elderly multigravida, third trimester: Secondary | ICD-10-CM

## 2017-09-20 DIAGNOSIS — O99013 Anemia complicating pregnancy, third trimester: Secondary | ICD-10-CM

## 2017-09-20 DIAGNOSIS — Z8759 Personal history of other complications of pregnancy, childbirth and the puerperium: Secondary | ICD-10-CM

## 2017-09-20 DIAGNOSIS — D649 Anemia, unspecified: Secondary | ICD-10-CM

## 2017-09-20 DIAGNOSIS — Z3480 Encounter for supervision of other normal pregnancy, unspecified trimester: Secondary | ICD-10-CM

## 2017-09-20 DIAGNOSIS — Z3483 Encounter for supervision of other normal pregnancy, third trimester: Secondary | ICD-10-CM

## 2017-09-20 DIAGNOSIS — O99019 Anemia complicating pregnancy, unspecified trimester: Secondary | ICD-10-CM

## 2017-09-20 LAB — POCT URINALYSIS DIP (DEVICE)
Bilirubin Urine: NEGATIVE
Glucose, UA: NEGATIVE mg/dL
HGB URINE DIPSTICK: NEGATIVE
Ketones, ur: NEGATIVE mg/dL
Leukocytes, UA: NEGATIVE
NITRITE: NEGATIVE
PH: 7 (ref 5.0–8.0)
PROTEIN: NEGATIVE mg/dL
SPECIFIC GRAVITY, URINE: 1.015 (ref 1.005–1.030)
UROBILINOGEN UA: 0.2 mg/dL (ref 0.0–1.0)

## 2017-09-20 NOTE — Progress Notes (Signed)
Pt stated having contraction.

## 2017-09-20 NOTE — Progress Notes (Signed)
   PRENATAL VISIT NOTE  Subjective:  Christy [name] is a 37 y.o. [id number] at [redacted]w[redacted]d being seen today for ongoing prenatal care.  She is currently monitored for the following issues for this high-risk pregnancy and has Encounter for supervision of multigravida with advanced maternal age; History of severe pre-eclampsia; Short interval between pregnancies affecting pregnancy, antepartum; Supervision of other normal pregnancy, antepartum; History of prior pregnancy with small for gestational age newborn; Anemia complicating pregnancy; Noncompliance; and Pelvic pressure in pregnancy, antepartum, third trimester on their problem list.  Patient reports contractions; has been contracting on/off for a few days, has been seen in MAU a few times..  Contractions: Regular. Vag. Bleeding: Bloody Show.  Movement: Present. Denies leaking of fluid.   The following portions of the patient's history were reviewed and updated as appropriate: allergies, current medications, past family history, past medical history, past social history, past surgical history and problem list. Problem list updated.  Objective:   Vitals:   09/20/17 1444  BP: 125/69  Pulse: (!) 103  Weight: 205 lb (93 kg)    Fetal Status: Fetal Heart Rate (bpm): 147   Movement: Present  Presentation: Vertex  General:  Alert, oriented and cooperative. Patient is in no acute distress.  Skin: Skin is warm and dry. No rash noted.   Cardiovascular: Normal heart rate noted  Respiratory: Normal respiratory effort, no problems with respiration noted  Abdomen: Soft, gravid, appropriate for gestational age.  Pain/Pressure: Present     Pelvic: Cervical exam performed Dilation: 3 Effacement (%): 30 Station: -3  Extremities: Normal range of motion.  Edema: Trace  Mental Status:  Normal mood and affect. Normal behavior. Normal judgment and thought content.   Assessment and Plan:  Pregnancy: [id number] at [redacted]w[redacted]d  1. Supervision of other normal pregnancy,  antepartum - Membranes stripped per patient's request - GBS negative - Follow in 1 week if not already delivered, and schedule IOL for 41 weeks  2. History of severe pre-eclampsia BP wnl - Pre-E precautions  3. Encounter for supervision of multigravida with advanced maternal age  17. Anemia affecting pregnancy, antepartum Last Hgb 10.7  Term labor symptoms and general obstetric precautions including but not limited to vaginal bleeding, contractions, leaking of fluid and fetal movement were reviewed in detail with the patient. Please refer to After Visit Summary for other counseling recommendations.  Return in about 1 week (around 09/27/2017) for [facility].  Almyra Free P. Verlena Marlette, MD OB Fellow  Future Appointments  Date Time Provider [place]  09/27/2017  2:15 PM Katheren Shams, [place] [facility] [place]  10/05/2017 11:15 AM Woodroe Mode, MD [place]

## 2017-09-21 ENCOUNTER — Inpatient Hospital Stay (HOSPITAL_COMMUNITY)
Admission: AD | Admit: 2017-09-21 | Discharge: 2017-09-24 | DRG: 807 | Disposition: A | Discharge status: Home / Self Care | Payer: Medicaid Other | Source: Ambulatory Visit | Attending: Obstetrics and Gynecology | Admitting: Obstetrics and Gynecology

## 2017-09-21 ENCOUNTER — Encounter (HOSPITAL_COMMUNITY): Payer: Self-pay

## 2017-09-21 DIAGNOSIS — D649 Anemia, unspecified: Secondary | ICD-10-CM | POA: Diagnosis present

## 2017-09-21 DIAGNOSIS — O9902 Anemia complicating childbirth: Secondary | ICD-10-CM | POA: Diagnosis present

## 2017-09-21 DIAGNOSIS — N949 Unspecified condition associated with female genital organs and menstrual cycle: Secondary | ICD-10-CM

## 2017-09-21 DIAGNOSIS — Z3A38 38 weeks gestation of pregnancy: Secondary | ICD-10-CM

## 2017-09-21 DIAGNOSIS — O99019 Anemia complicating pregnancy, unspecified trimester: Secondary | ICD-10-CM | POA: Diagnosis present

## 2017-09-21 DIAGNOSIS — O403XX0 Polyhydramnios, third trimester, not applicable or unspecified: Principal | ICD-10-CM | POA: Diagnosis present

## 2017-09-21 DIAGNOSIS — O09299 Supervision of pregnancy with other poor reproductive or obstetric history, unspecified trimester: Secondary | ICD-10-CM

## 2017-09-21 DIAGNOSIS — Z3A39 39 weeks gestation of pregnancy: Secondary | ICD-10-CM | POA: Diagnosis not present

## 2017-09-21 DIAGNOSIS — Z8759 Personal history of other complications of pregnancy, childbirth and the puerperium: Secondary | ICD-10-CM

## 2017-09-21 DIAGNOSIS — O368130 Decreased fetal movements, third trimester, not applicable or unspecified: Secondary | ICD-10-CM | POA: Diagnosis present

## 2017-09-21 DIAGNOSIS — O288 Other abnormal findings on antenatal screening of mother: Secondary | ICD-10-CM

## 2017-09-21 DIAGNOSIS — O26893 Other specified pregnancy related conditions, third trimester: Secondary | ICD-10-CM

## 2017-09-21 DIAGNOSIS — O99213 Obesity complicating pregnancy, third trimester: Secondary | ICD-10-CM

## 2017-09-21 DIAGNOSIS — O09513 Supervision of elderly primigravida, third trimester: Secondary | ICD-10-CM

## 2017-09-21 DIAGNOSIS — O409XX0 Polyhydramnios, unspecified trimester, not applicable or unspecified: Secondary | ICD-10-CM | POA: Diagnosis present

## 2017-09-21 DIAGNOSIS — R102 Pelvic and perineal pain: Secondary | ICD-10-CM

## 2017-09-22 ENCOUNTER — Inpatient Hospital Stay (HOSPITAL_COMMUNITY): Payer: Medicaid Other

## 2017-09-22 LAB — CBC
HCT: 31.3 % — ABNORMAL LOW (ref 36.0–46.0)
HEMOGLOBIN: 10.2 g/dL — AB (ref 12.0–15.0)
MCH: 25.7 pg — AB (ref 26.0–34.0)
MCHC: 32.6 g/dL (ref 30.0–36.0)
MCV: 78.8 fL (ref 78.0–100.0)
Platelets: 262 10*3/uL (ref 150–400)
RBC: 3.97 MIL/uL (ref 3.87–5.11)
RDW: 17.5 % — ABNORMAL HIGH (ref 11.5–15.5)
WBC: 7 10*3/uL (ref 4.0–10.5)

## 2017-09-22 LAB — RPR: RPR: NONREACTIVE

## 2017-09-22 LAB — RAPID STREP SCREEN (MED CTR MEBANE ONLY): Streptococcus, Group A Screen (Direct): NEGATIVE

## 2017-09-22 LAB — TYPE AND SCREEN
ABO/RH(D): B POS
Antibody Screen: NEGATIVE

## 2017-09-22 MED ORDER — EPHEDRINE 5 MG/ML INJ
10.0000 mg | INTRAVENOUS | Status: DC | PRN
Start: 2017-09-22 — End: 2017-09-23
  Filled 2017-09-22: qty 2

## 2017-09-22 MED ORDER — LACTATED RINGERS IV SOLN
INTRAVENOUS | Status: DC
  Administered 2017-09-22 (×2): via INTRAVENOUS
  Administered 2017-09-23: 125 mL via INTRAVENOUS
  Administered 2017-09-23: 03:00:00 via INTRAVENOUS

## 2017-09-22 MED ORDER — PHENYLEPHRINE 40 MCG/ML (10ML) SYRINGE FOR IV PUSH (FOR BLOOD PRESSURE SUPPORT)
80.0000 ug | PREFILLED_SYRINGE | INTRAVENOUS | Status: DC | PRN
  Filled 2017-09-22: qty 5

## 2017-09-22 MED ORDER — TERBUTALINE SULFATE 1 MG/ML IJ SOLN
0.2500 mg | Freq: Once | INTRAMUSCULAR | Status: DC | PRN
  Filled 2017-09-22: qty 1

## 2017-09-22 MED ORDER — ONDANSETRON HCL 4 MG/2ML IJ SOLN
4.0000 mg | Freq: Four times a day (QID) | INTRAMUSCULAR | Status: DC | PRN
  Administered 2017-09-23: 4 mg via INTRAVENOUS
  Filled 2017-09-22: qty 2

## 2017-09-22 MED ORDER — OXYTOCIN 40 UNITS IN LACTATED RINGERS INFUSION - SIMPLE MED: 2.5000 [IU]/h | INTRAVENOUS | Status: DC

## 2017-09-22 MED ORDER — LIDOCAINE HCL (PF) 1 % IJ SOLN
30.0000 mL | INTRAMUSCULAR | Status: DC | PRN
  Filled 2017-09-22: qty 30

## 2017-09-22 MED ORDER — FENTANYL 2.5 MCG/ML BUPIVACAINE 1/10 % EPIDURAL INFUSION (WH - ANES)
14.0000 mL/h | INTRAMUSCULAR | Status: DC | PRN
  Filled 2017-09-22: qty 100

## 2017-09-22 MED ORDER — OXYCODONE-ACETAMINOPHEN 5-325 MG PO TABS: 1.0000 | ORAL_TABLET | ORAL | Status: DC | PRN

## 2017-09-22 MED ORDER — PHENYLEPHRINE 40 MCG/ML (10ML) SYRINGE FOR IV PUSH (FOR BLOOD PRESSURE SUPPORT)
80.0000 ug | PREFILLED_SYRINGE | INTRAVENOUS | Status: DC | PRN
  Filled 2017-09-22: qty 10
  Filled 2017-09-22: qty 5

## 2017-09-22 MED ORDER — FENTANYL CITRATE (PF) 100 MCG/2ML IJ SOLN
50.0000 ug | INTRAMUSCULAR | Status: DC | PRN
  Administered 2017-09-23: 50 ug via INTRAVENOUS
  Filled 2017-09-22: qty 2

## 2017-09-22 MED ORDER — OXYTOCIN BOLUS FROM INFUSION
500.0000 mL | Freq: Once | INTRAVENOUS | Status: AC
  Administered 2017-09-23: 500 mL via INTRAVENOUS

## 2017-09-22 MED ORDER — FLEET ENEMA 7-19 GM/118ML RE ENEM: 1.0000 | ENEMA | RECTAL | Status: DC | PRN

## 2017-09-22 MED ORDER — LACTATED RINGERS IV SOLN
500.0000 mL | INTRAVENOUS | Status: DC | PRN
  Administered 2017-09-22: 250 mL via INTRAVENOUS

## 2017-09-22 MED ORDER — EPHEDRINE 5 MG/ML INJ
10.0000 mg | INTRAVENOUS | Status: DC | PRN
  Filled 2017-09-22: qty 2

## 2017-09-22 MED ORDER — OXYTOCIN 40 UNITS IN LACTATED RINGERS INFUSION - SIMPLE MED
1.0000 m[IU]/min | INTRAVENOUS | Status: DC
  Administered 2017-09-22: 2 m[IU]/min via INTRAVENOUS
  Administered 2017-09-23: 22 m[IU]/min via INTRAVENOUS
  Filled 2017-09-22: qty 1000

## 2017-09-22 MED ORDER — DIPHENHYDRAMINE HCL 50 MG/ML IJ SOLN
12.5000 mg | INTRAMUSCULAR | Status: DC | PRN
  Administered 2017-09-23: 12.5 mg via INTRAVENOUS

## 2017-09-22 MED ORDER — OXYCODONE-ACETAMINOPHEN 5-325 MG PO TABS: 2.0000 | ORAL_TABLET | ORAL | Status: DC | PRN

## 2017-09-22 MED ORDER — OXYTOCIN 40 UNITS IN LACTATED RINGERS INFUSION - SIMPLE MED
1.0000 m[IU]/min | INTRAVENOUS | Status: DC
  Filled 2017-09-22: qty 1000

## 2017-09-22 MED ORDER — HYDROXYZINE HCL 50 MG PO TABS
50.0000 mg | ORAL_TABLET | Freq: Four times a day (QID) | ORAL | Status: DC | PRN
  Administered 2017-09-22: 50 mg via ORAL
  Filled 2017-09-22 (×2): qty 1

## 2017-09-22 MED ORDER — ACETAMINOPHEN 325 MG PO TABS
650.0000 mg | ORAL_TABLET | ORAL | Status: DC | PRN
  Administered 2017-09-22: 650 mg via ORAL
  Filled 2017-09-22: qty 2

## 2017-09-22 MED ORDER — FENTANYL 2.5 MCG/ML BUPIVACAINE 1/10 % EPIDURAL INFUSION (WH - ANES)
14.0000 mL/h | INTRAMUSCULAR | Status: DC | PRN
  Administered 2017-09-23: 14 mL/h via EPIDURAL

## 2017-09-22 MED ORDER — LACTATED RINGERS IV SOLN
500.0000 mL | Freq: Once | INTRAVENOUS | Status: AC
  Administered 2017-09-23: 500 mL via INTRAVENOUS

## 2017-09-22 MED ORDER — DIPHENHYDRAMINE HCL 50 MG/ML IJ SOLN
12.5000 mg | INTRAMUSCULAR | Status: DC | PRN
  Filled 2017-09-22: qty 1

## 2017-09-22 MED ORDER — SOD CITRATE-CITRIC ACID 500-334 MG/5ML PO SOLN: 30.0000 mL | ORAL | Status: DC | PRN

## 2017-09-22 MED ORDER — MENTHOL 3 MG MT LOZG
1.0000 | LOZENGE | OROMUCOSAL | Status: DC | PRN
  Filled 2017-09-22 (×2): qty 9

## 2017-09-22 MED ORDER — LACTATED RINGERS IV SOLN: 500.0000 mL | Freq: Once | INTRAVENOUS | Status: DC

## 2017-09-22 NOTE — Anesthesia Pain Management Evaluation Note (Signed)
  CRNA Pain Management Visit Note  Patient: Christy [name], 37 y.o., female  "Hello I am a member of the anesthesia team at [facility]. We have an anesthesia team available at all times to provide care throughout the hospital, including epidural management and anesthesia for C-section. I don't know your plan for the delivery whether it a natural birth, water birth, IV sedation, nitrous supplementation, doula or epidural, but we want to meet your pain goals."   1.Was your pain managed to your expectations on prior hospitalizations?   Yes   2.What is your expectation for pain management during this hospitalization?     Labor support without medications, Epidural, IV pain meds and Nitrous Oxide  3.How can we help you reach that goal? Pt unsure of plan for induction of labor today. Pain control methods discussed in case she needs them. All questions answered.  Record the patient's initial score and the patient's pain goal.   Pain: 5  Pain Goal: 10 [facility] wants you to be able to say your pain was always managed very well.  Christy [name] 09/22/2017

## 2017-09-22 NOTE — Progress Notes (Addendum)
RN and provider  at bedside. Provider checked cervix stating pt was 3cm thick and ballotable. Pt becoming angry and agitated. Pt asking for pitocin and/or AROM. Provider explains that walking would be best option now that ctx have spaced out and current cervical exam. Pt refusing stating that she was "exhausted" and that we have "wasted her time." Pt given the option to return home; pt refused. Provider strongly encourage pt to walk. Pt states she will wait for second Provider to come on the unit to discuss her concerns. RN and Provider agree. Upon leaving RN re-assessed pt need and opted to assign her a new RN if she desired; stating "I don't care." For now RN will remain the same.

## 2017-09-22 NOTE — Progress Notes (Signed)
RN has been corresponding with both pt and provider. Pt becoming agitated and defensive after given the option to get up to walk to increase ctx pattern, and also given the option to go home if she desired. Pt refusing to go home, and requesting to speak with provider. Provider notified.

## 2017-09-22 NOTE — Progress Notes (Signed)
Addendum to H&P:  LABOR AND DELIVERY ADMISSION HISTORY AND PHYSICAL NOTE  Christy [name] is a 37 y.o. female [phone] with IUP at [redacted]w[redacted]d by ultrasound presenting for IOL for polyhydramnios.  She reports positive fetal movement. She denies leakage of fluid or vaginal bleeding.  Sono: @[redacted]w[redacted]d , CWD, normal female anatomy, transverse presentation, placenta posterior above cervical os, 315g, 54% EFW  Review of Systems  All systems reviewed and negative except as stated in HPI  Physical Exam Blood pressure (!) 144/74, pulse (!) 113, temperature 98.3 F (36.8 C), temperature source Oral, resp. rate 16, height 5\' 5"  (1.651 m), weight 93.9 kg (207 lb 1.9 oz), last menstrual period 12/20/2016, unknown if currently breastfeeding. General appearance: alert, oriented, NAD Lungs: normal respiratory effort Heart: regular rate Abdomen: soft, non-tender; gravid, FH appropriate for GA Extremities: No calf swelling or tenderness Presentation: cephalic per previous exam  Fetal monitoring: FHR 130, minimal variability, +accel, -decel Uterine activity: irregular  Dilation: 3 Effacement (%): Thick Station: Ballotable Exam by:: Gala Romney  Results for orders placed or performed during the hospital encounter of 09/21/17 (from the past 24 hour(s))  CBC   Collection Time: 09/21/17 11:30 PM  Result Value Ref Range   WBC 7.0 4.0 - 10.5 K/uL   RBC 3.97 3.87 - 5.11 MIL/uL   Hemoglobin 10.2 (L) 12.0 - 15.0 g/dL   HCT 31.3 (L) 36.0 - 46.0 %   MCV 78.8 78.0 - 100.0 fL   MCH 25.7 (L) 26.0 - 34.0 pg   MCHC 32.6 30.0 - 36.0 g/dL   RDW 17.5 (H) 11.5 - 15.5 %   Platelets 262 150 - 400 K/uL  RPR   Collection Time: 09/21/17 11:30 PM  Result Value Ref Range   RPR Ser Ql Non Reactive Non Reactive  Type and screen [place]   Collection Time: 09/21/17 11:30 PM  Result Value Ref Range   ABO/RH(D) B POS    Antibody Screen NEG    Sample Expiration      09/24/2017 Performed at [facility],  [address] [place], [place] [address]   Rapid Strep Screen (Not at [facility])   Collection Time: 09/22/17 10:15 AM  Result Value Ref Range   Streptococcus, Group A Screen (Direct) NEGATIVE NEGATIVE    Patient Active Problem List   Diagnosis Date Noted  . Normal labor 09/22/2017  . Pelvic pressure in pregnancy, antepartum, third trimester 08/25/2017  . Polyhydramnios 08/09/2017  . Noncompliance 07/26/2017  . Anemia complicating pregnancy 37/05/6268  . History of prior pregnancy with small for gestational age newborn 06/05/2017  . Supervision of other normal pregnancy, antepartum 05/01/2017  . Short interval between pregnancies affecting pregnancy, antepartum 03/15/2017  . History of severe pre-eclampsia 03/27/2016  . Encounter for supervision of multigravida with advanced maternal age 74/03/2016    Assessment: Christy [name] is a 37 y.o. [id number] at [redacted]w[redacted]d here for IOL for polyhydramnios  #Labor: will induce with pitocin, can titrate per protocol  #Pain: Per patient request  #FWB: Category 2 #ID:  GBS neg  #MOF: both breast and bottle  #MOC:paraguard  #Circ:  N/A  Caroline More, DO PGY-1 09/22/2017, 12:53 PM   CNM attestation:  I have seen and examined this patient; I agree with above documentation in the resident's note.   Christy [name] is a 37 y.o. [id number] here for labor eval initially, but found not to be in labor. Prior to d/c home, obtained U/S to follow up on previously resolved polyhydramnios and found to be poly  again with AFI of 27.44cm.  PE: BP (!) 116/55   Pulse 94   Temp 98.3 F (36.8 C) (Oral)   Resp 16   Ht 5\' 5"  (1.651 m)   Wt 93.9 kg (207 lb 1.9 oz)   LMP 12/20/2016 (Approximate)   BMI 34.47 kg/m  Gen: calm comfortable, NAD Resp: normal effort, no distress Abd: gravid  ROS, labs, PMH reviewed  Plan: Will begin induction using Pit, then plan to AROM once baby's head has come down. Pt agreeable with plan.  Christy [name], Christy [name] CNM 09/22/2017,  3:21 PM

## 2017-09-22 NOTE — H&P (Signed)
Christy [name] is a 37 y.o. female [phone] with IUP at 109w3d presenting for contractions  Membranes are intact, with active fetal movement.   [facility] at [facility] 17 wks  Prenatal History/Complications: Hx preeclampsia Hx SGA Multiple no shows for appts Past Medical History: Past Medical History:  Diagnosis Date  . Anemia   . Preeclampsia, severe 03/29/2016    Past Surgical History: Past Surgical History:  Procedure Laterality Date  . NO PAST SURGERIES      Obstetrical History: OB History    Gravida Para Term Preterm AB Living   9 7 6 1 1 7    SAB TAB Ectopic Multiple Live Births   1 0 0 0 7      Obstetric Comments   03/30/2016 Induced for severe preeclampsia       Social History: Social History   Socioeconomic History  . Marital status: Married    Spouse name: None  . Number of children: None  . Years of education: None  . Highest education level: None  Social Needs  . Financial resource strain: None  . Food insecurity - worry: None  . Food insecurity - inability: None  . Transportation needs - medical: None  . Transportation needs - non-medical: None  Occupational History  . None  Tobacco Use  . Smoking status: Never Smoker  . Smokeless tobacco: Never Used  Substance and Sexual Activity  . Alcohol use: No  . Drug use: No  . Sexual activity: Yes    Birth control/protection: None    Comment: IUD removed 03/2015  Other Topics Concern  . None  Social History Narrative  . None    Family History: Family History  Problem Relation Age of Onset  . Hypertension Mother   . Hypertension Father   . Heart attack Father   . Stroke Father   . Hypertension Sister   . Hypertension Maternal Grandmother     Allergies: Allergies  Allergen Reactions  . Diflucan [Fluconazole]     Mouth and vagina area breaks out into hives  . Sulfamethoxazole-Trimethoprim Hives  . Latex Rash  . Metronidazole Rash    If takes longer than 5 days will develop a rash     Medications Prior to Admission  Medication Sig Dispense Refill Last Dose  . Ferrous Gluconate 324 (37.5 Fe) MG TABS TAKE 1 TABLET (324 MG TOTAL) BY MOUTH DAILY  3 09/21/2017 at Unknown time  . acetaminophen (TYLENOL) 500 MG tablet Take 500 mg by mouth every 6 (six) hours as needed for headache.   Taking  . calcium carbonate (TUMS - DOSED IN MG ELEMENTAL CALCIUM) 500 MG chewable tablet Chew 2 tablets by mouth 2 (two) times daily as needed for indigestion or heartburn.   Taking  . Elastic Bandages & Supports (COMFORT FIT MATERNITY SUPP LG) MISC 1 Units by Does not apply route daily. 1 each 0 Taking  . Prenat w/o A Vit-FeFum-FePo-FA (CONCEPT OB) 130-92.4-1 MG CAPS Take 1 capsule by mouth daily. 30 capsule 11 Taking  . Witch Hazel (TUCKS) 50 % PADS Apply 1 each topically daily as needed (use daily as needed for hemorrhoids). 1 each 2 Taking        Review of Systems   Constitutional: Negative for fever and chills Eyes: Negative for visual disturbances Respiratory: Negative for shortness of breath, dyspnea Cardiovascular: Negative for chest pain or palpitations  Gastrointestinal: Negative for vomiting, diarrhea and constipation.  POSITIVE for abdominal pain (contractions) Genitourinary: Negative for dysuria and urgency Musculoskeletal: Negative  for back pain, joint pain, myalgias  Neurological: Negative for dizziness and headaches      Blood pressure 129/78, pulse (!) 117, temperature 97.9 F (36.6 C), resp. rate 19, height 5\' 5"  (1.651 m), weight 207 lb 1.9 oz (93.9 kg), last menstrual period 12/20/2016, unknown if currently breastfeeding. General appearance: alert and cooperative Lungs: clear to auscultation bilaterally Heart: regular rate and rhythm Abdomen: soft, non-tender; bowel sounds normal Extremities: Homans sign is negative, no sign of DVT DTR's 2+ Presentation: cephalic Fetal monitoring  Baseline: 140 bpm, Variability: Good {> 6 bpm), Accelerations: Reactive and  Decelerations: Absent Uterine activity  2-3 minutes Dilation: 5 Effacement (%): 70, 80 Station: -2 Exam by:: Esau Grew RN   Prenatal labs: ABO, Rh: B/Positive/-- (09/24 1406) Antibody: Negative (09/24 1406) Rubella: 1.40 (09/24 1406) RPR: Non Reactive (12/07 1048)  HBsAg: Negative (09/24 1406)  HIV: Non Reactive (12/07 1048)  GBS: Negative (01/23 0000)  2 hr Glucola 75/105/95 Genetic screening  Neg AFP Anatomy US normal  Prenatal Transfer Tool  Maternal Diabetes: No Genetic Screening: Normal Maternal Ultrasounds/Referrals: Normal Fetal Ultrasounds or other Referrals:  None Maternal Substance Abuse:  No Significant Maternal Medications:  None Significant Maternal Lab Results: Lab values include: Group B Strep negative [facility] Prenatal Labs  Dating 8 week scan Blood type: B/Positive/-- (09/24 1406)   Genetic Screen 1 Screen:    AFP:     Quad:     NIPS: Antibody:Negative (09/24 1406)  Anatomic [place]  normal/female. Will need serial growth in 3rd trimester due to [place] and hx of SGA baby Rubella: 1.40 (09/24 1406)  GTT Early:               Third trimester: 2 hour wnl RPR: Non Reactive (09/24 1406)   Flu vaccine  HBsAg: Negative (09/24 1406)   TDaP vaccine                                               Rhogam: HIV:   Negative  Baby Food                                               GBS:   Contraception  Pap:  Circumcision  female    Pediatrician  CF:  Support Person  SMA  Prenatal Classes  Hgb electrophoresis:AA       No results found for this or any previous visit (from the past 24 hour(s)).  Assessment: Christy [name] is a 37 y.o. [id number] with an IUP at [redacted]w[redacted]d presenting for labor  Plan: #Labor: expectant management #Pain:  Per request #FWB Cat 1 : Christy [name] 09/22/2017, 12:57 AM

## 2017-09-22 NOTE — Progress Notes (Signed)
Vitals:   09/21/17 2350 09/22/17 0153  BP:  117/71  Pulse:  97  Resp:  18  Temp: 97.9 F (36.6 C)    Pt's contractions have spaced out considerably.  Cx 3/thick/ballottable.  Pt very agitated when informed that she is not 5/80/-2.  She demands that she gets pitocin or AROM.  Explained to pt that her cervix is moderately firm and thick and her contractions are spacing out and there is not an indication for IOL at this time.  Pt became very rude and condescending to both the nurse and this CNM, stating that we are "wasting her time at 4 in the morning"  Offered pt discharge and she refused, again demanding Pitocin, stating that it is her right to get it if she asks for it.  After politely disagreeing with her, she was encouraged to get up and walk to see if her contractions would improve.  Pt refused, stating she is tired and demands to speak with Dr. Roselie Awkward.  Dr. Roselie Awkward is unavailable for non urgent matters at this time. An agreement was made to see what happens over the next few hours with her contractions and when DR. Roselie Awkward becomes available they can discuss her concerns.   FHR Cat 1.

## 2017-09-22 NOTE — Progress Notes (Signed)
LABOR PROGRESS NOTE  Christy [name] is a 37 y.o. [id number] at [redacted]w[redacted]d  admitted for IOL for polyhydramnios  Subjective: Patient doing well, reports she was feeling contractions in good pattern earlier, but has slowed down some.  Objective: BP (!) 112/54   Pulse 90   Temp 98.1 F (36.7 C) (Oral)   Resp 18   Ht 5\' 5"  (1.651 m)   Wt 207 lb 1.9 oz (93.9 kg)   LMP 12/20/2016 (Approximate)   BMI 34.47 kg/m  or  Vitals:   09/22/17 1826 09/22/17 1934 09/22/17 2001 09/22/17 2033  BP: 124/70 136/66 (!) 118/53 (!) 112/54  Pulse: 90 96 88 90  Resp:  18 18   Temp:  98.1 F (36.7 C)    TempSrc:  Oral    Weight:      Height:        SVE Dilation: 3 Effacement (%): Thick Cervical Position: Anterior Station: Ballotable Presentation: Vertex Exam by:: Dr. Lambert [name] FHT: baseline rate 130, moderate varibility, + acel, no decel Toco: ctx q2-3 min   Assessment / Plan: 37 y.o. [id number] at [redacted]w[redacted]d here for IOL for polyhydramnios  Labor: Pitocin currently at 71mU/min, continue to titrate. Plan to likely AROM once head comes down, but remains ballotable Fetal Wellbeing:  Cat I Pain Control:  Labor support; unsure if she will want epidural Anticipated MOD:  SVD  Christy Shelter, MD 09/22/2017, 9:18 PM

## 2017-09-22 NOTE — Progress Notes (Signed)
Christy [name] is a 37 y.o. [id number] at [redacted]w[redacted]d by ultrasound admitted for induction of labor due to polyhydramnios.  Subjective: Patient doing well. No questions at this time. States she is trying to rest now.   Objective: BP 118/68   Pulse 86   Temp 99.3 F (37.4 C)   Resp 18   Ht 5\' 5"  (1.651 m)   Wt 93.9 kg (207 lb 1.9 oz)   LMP 12/20/2016 (Approximate)   BMI 34.47 kg/m  No intake/output data recorded. No intake/output data recorded.  FHT:  FHR: 130 bpm, variability: moderate,  accelerations:  Present,  decelerations:  Absent UC:   irregular, every 1-4 minutes SVE:   Dilation: 3 Effacement (%): Thick Station: Ballotable Exam by:: [employer]: Lab Results  Component Value Date   WBC 7.0 09/21/2017   HGB 10.2 (L) 09/21/2017   HCT 31.3 (L) 09/21/2017   MCV 78.8 09/21/2017   PLT 262 09/21/2017    Assessment / Plan: Induction of labor due to polyhydramnios,  progressing well on pitocin  Labor: Pitocin at 79miliunits/min, will continue to titrate per protocol Fetal Wellbeing:  Category I Pain Control:  Per patient request I/D:  n/a Anticipated MOD:  NSVD  Caroline More, DO PGY-1 09/22/2017, 5:06 PM

## 2017-09-23 ENCOUNTER — Encounter (HOSPITAL_COMMUNITY): Payer: Self-pay | Admitting: *Deleted

## 2017-09-23 ENCOUNTER — Inpatient Hospital Stay (HOSPITAL_COMMUNITY): Payer: Medicaid Other | Admitting: Anesthesiology

## 2017-09-23 DIAGNOSIS — Z3A38 38 weeks gestation of pregnancy: Secondary | ICD-10-CM

## 2017-09-23 DIAGNOSIS — O403XX0 Polyhydramnios, third trimester, not applicable or unspecified: Secondary | ICD-10-CM

## 2017-09-23 MED ORDER — SIMETHICONE 80 MG PO CHEW: 80.0000 mg | CHEWABLE_TABLET | ORAL | Status: DC | PRN

## 2017-09-23 MED ORDER — BENZOCAINE-MENTHOL 20-0.5 % EX AERO
1.0000 "application " | INHALATION_SPRAY | CUTANEOUS | Status: DC | PRN
  Filled 2017-09-23: qty 56

## 2017-09-23 MED ORDER — TETANUS-DIPHTH-ACELL PERTUSSIS 5-2.5-18.5 LF-MCG/0.5 IM SUSP: 0.5000 mL | Freq: Once | INTRAMUSCULAR | Status: DC

## 2017-09-23 MED ORDER — ONDANSETRON HCL 4 MG PO TABS: 4.0000 mg | ORAL_TABLET | ORAL | Status: DC | PRN

## 2017-09-23 MED ORDER — PRENATAL MULTIVITAMIN CH
1.0000 | ORAL_TABLET | Freq: Every day | ORAL | Status: DC
Start: 2017-09-23 — End: 2017-09-24
  Filled 2017-09-23: qty 1

## 2017-09-23 MED ORDER — LIDOCAINE HCL (PF) 1 % IJ SOLN
INTRAMUSCULAR | Status: DC | PRN
  Administered 2017-09-23: 13 mL via EPIDURAL

## 2017-09-23 MED ORDER — WITCH HAZEL-GLYCERIN EX PADS: 1.0000 "application " | MEDICATED_PAD | CUTANEOUS | Status: DC | PRN

## 2017-09-23 MED ORDER — ACETAMINOPHEN 325 MG PO TABS
650.0000 mg | ORAL_TABLET | ORAL | Status: DC | PRN
  Administered 2017-09-23 – 2017-09-24 (×3): 650 mg via ORAL
  Filled 2017-09-23 (×3): qty 2

## 2017-09-23 MED ORDER — DIBUCAINE 1 % RE OINT: 1.0000 "application " | TOPICAL_OINTMENT | RECTAL | Status: DC | PRN

## 2017-09-23 MED ORDER — SENNOSIDES-DOCUSATE SODIUM 8.6-50 MG PO TABS
2.0000 | ORAL_TABLET | ORAL | Status: DC
  Filled 2017-09-23: qty 2

## 2017-09-23 MED ORDER — ZOLPIDEM TARTRATE 5 MG PO TABS: 5.0000 mg | ORAL_TABLET | Freq: Every evening | ORAL | Status: DC | PRN

## 2017-09-23 MED ORDER — ONDANSETRON HCL 4 MG/2ML IJ SOLN: 4.0000 mg | INTRAMUSCULAR | Status: DC | PRN

## 2017-09-23 MED ORDER — IBUPROFEN 600 MG PO TABS
600.0000 mg | ORAL_TABLET | Freq: Four times a day (QID) | ORAL | Status: DC
  Administered 2017-09-23 – 2017-09-24 (×4): 600 mg via ORAL
  Filled 2017-09-23 (×4): qty 1

## 2017-09-23 MED ORDER — COCONUT OIL OIL: 1.0000 "application " | TOPICAL_OIL | Status: DC | PRN

## 2017-09-23 MED ORDER — DIPHENHYDRAMINE HCL 25 MG PO CAPS: 25.0000 mg | ORAL_CAPSULE | Freq: Four times a day (QID) | ORAL | Status: DC | PRN

## 2017-09-23 NOTE — Anesthesia Procedure Notes (Signed)
Epidural Patient location during procedure: OB Start time: 09/23/2017 4:36 AM End time: 09/23/2017 4:53 AM  Staffing Anesthesiologist: Lynda Rainwater, MD Performed: anesthesiologist   Preanesthetic Checklist Completed: patient identified, site marked, surgical consent, pre-op evaluation, timeout performed, IV checked, risks and benefits discussed and monitors and equipment checked  Epidural Patient position: sitting Prep: ChloraPrep Patient monitoring: heart rate, cardiac monitor, continuous pulse ox and blood pressure Approach: midline Location: L2-L3 Injection technique: LOR saline  Needle:  Needle type: Tuohy  Needle gauge: 17 G Needle length: 9 cm Needle insertion depth: 7 cm Catheter type: closed end flexible Catheter size: 20 Guage Catheter at skin depth: 11 cm Test dose: negative  Assessment Events: blood not aspirated, injection not painful, no injection resistance, negative IV test and no paresthesia  Additional Notes Reason for block:procedure for pain

## 2017-09-23 NOTE — Progress Notes (Signed)
LABOR PROGRESS NOTE  Christy [name] is a 37 y.o. [id number] at [redacted]w[redacted]d  admitted for IOL for polyhydramnios  Subjective: Patient doing well. Feeling contractions, but still not regular  Objective: BP 111/61   Pulse 92   Temp 98.4 F (36.9 C) (Oral)   Resp 20   Ht 5\' 5"  (1.651 m)   Wt 207 lb 1.9 oz (93.9 kg)   LMP 12/20/2016 (Approximate)   BMI 34.47 kg/m  or  Vitals:   09/22/17 2240 09/22/17 2309 09/22/17 2357 09/23/17 0034  BP: (!) 116/58 125/66 (!) 112/56 111/61  Pulse: 97 96 83 92  Resp:   20   Temp:   98.4 F (36.9 C)   TempSrc:   Oral   Weight:      Height:        SVE Dilation: 3.5 Effacement (%): 40 Cervical Position: Anterior Station: Ballotable Presentation: Vertex Exam by:: Dr. Lambert Keto FHT: baseline rate 150, moderate varibility, + acel, no decel Toco: ctx q2-3 min   Assessment / Plan: 37 y.o. [id number] at [redacted]w[redacted]d here for IOL for polyhydramnios  Labor: Pitocin currently at 43mU/min, continue to titrate. Remains ballotable, so no AROM yet Fetal Wellbeing:  Cat I Pain Control:  Labor support; she is unsure if she will want epidural Anticipated MOD:  SVD  [name], MD 09/23/2017, 1:07 AM

## 2017-09-23 NOTE — Progress Notes (Signed)
LABOR PROGRESS NOTE  Christy [name] is a 37 y.o. [id number] at [redacted]w[redacted]d  admitted for IOL for polyhydramnios  Subjective: Patient doing well. Contractions getting stronger and she would like epidural  Objective: BP 119/61   Pulse 90   Temp 98.4 F (36.9 C) (Oral)   Resp 20   Ht 5\' 5"  (1.651 m)   Wt 207 lb 1.9 oz (93.9 kg)   LMP 12/20/2016 (Approximate)   BMI 34.47 kg/m  or  Vitals:   09/22/17 2309 09/22/17 2357 09/23/17 0034 09/23/17 0107  BP: 125/66 (!) 112/56 111/61 119/61  Pulse: 96 83 92 90  Resp:  20    Temp:  98.4 F (36.9 C)    TempSrc:  Oral    Weight:      Height:        SVE Dilation: 4 Effacement (%): 40 Cervical Position: Anterior Station: -3 Presentation: Vertex Exam by:: Dr. Lambert Keto FHT: baseline rate 125, moderate varibility, + acel, no decel Toco: ctx q2-3 min   Assessment / Plan: 37 y.o. [id number] at [redacted]w[redacted]d here for IOL for polyhydramnios  Labor: Pitocin currently at 27mU/min, continue to titrate. AROM with clear fluid, IUPC palced Fetal Wellbeing:  Cat I Pain Control:  Will get epidural Anticipated MOD:  SVD  [name], MD 09/23/2017, 4:34 AM

## 2017-09-23 NOTE — Anesthesia Postprocedure Evaluation (Signed)
Anesthesia Post Note  Patient: Christy [name]  Procedure(s) Performed: AN AD [place]     Patient location during evaluation: Mother Baby Anesthesia Type: Epidural Level of consciousness: awake and alert and oriented Pain management: satisfactory to patient Vital Signs Assessment: post-procedure vital signs reviewed and stable Respiratory status: respiratory function stable Cardiovascular status: stable Postop Assessment: no headache, no backache, epidural receding, patient able to bend at knees, no signs of nausea or vomiting and adequate PO intake Anesthetic complications: no    Last Vitals:  Vitals:   09/23/17 0730 09/23/17 0745  BP: 101/62 (!) 125/59  Pulse: 78 80  Resp: 18 18  Temp:    SpO2:      Last Pain:  Vitals:   09/23/17 1230  TempSrc:   PainSc: 7    Pain Goal: Patients Stated Pain Goal: 4 (09/23/17 1230)               Diamonds Lippard

## 2017-09-23 NOTE — Anesthesia Preprocedure Evaluation (Signed)
Anesthesia Evaluation  Patient identified by MRN, date of birth, ID band Patient awake    Reviewed: Allergy & Precautions, H&P , Patient's Chart, lab work & pertinent test results  Airway Mallampati: II  TM Distance: >3 FB Neck ROM: full    Dental  (+) Teeth Intact   Pulmonary    breath sounds clear to auscultation       Cardiovascular hypertension,  Rhythm:regular Rate:Normal     Neuro/Psych    GI/Hepatic   Endo/Other    Renal/GU      Musculoskeletal   Abdominal   Peds  Hematology   Anesthesia Other Findings    PIH; Normal Plts   Reproductive/Obstetrics (+) Pregnancy                             Anesthesia Physical  Anesthesia Plan  ASA: III  Anesthesia Plan: Epidural   Post-op Pain Management:    Induction:   PONV Risk Score and Plan:   Airway Management Planned:   Additional Equipment:   Intra-op Plan:   Post-operative Plan:   Informed Consent: I have reviewed the patients History and Physical, chart, labs and discussed the procedure including the risks, benefits and alternatives for the proposed anesthesia with the patient or authorized representative who has indicated his/her understanding and acceptance.   Dental Advisory Given  Plan Discussed with:   Anesthesia Plan Comments: (Labs checked- platelets confirmed with RN in room. Fetal heart tracing, per RN, reported to be stable enough for sitting procedure. Discussed epidural, and patient consents to the procedure:  included risk of possible headache,backache, failed block, allergic reaction, and nerve injury. This patient was asked if she had any questions or concerns before the procedure started.)        Anesthesia Quick Evaluation

## 2017-09-24 ENCOUNTER — Other Ambulatory Visit: Payer: Self-pay

## 2017-09-24 LAB — CULTURE, GROUP A STREP (THRC)

## 2017-09-24 MED ORDER — TRAMADOL HCL 50 MG PO TABS: 50.0000 mg | ORAL_TABLET | Freq: Four times a day (QID) | ORAL | 0 refills | Status: DC | PRN

## 2017-09-24 MED ORDER — OXYCODONE HCL 5 MG PO TABS
5.0000 mg | ORAL_TABLET | Freq: Once | ORAL | Status: AC
  Administered 2017-09-24: 5 mg via ORAL
  Filled 2017-09-24: qty 1

## 2017-09-24 MED ORDER — CALCIUM CARBONATE ANTACID 500 MG PO CHEW
2.0000 | CHEWABLE_TABLET | ORAL | Status: DC | PRN
  Administered 2017-09-24: 400 mg via ORAL
  Filled 2017-09-24: qty 2

## 2017-09-24 MED ORDER — IBUPROFEN 600 MG PO TABS: 600.0000 mg | ORAL_TABLET | Freq: Four times a day (QID) | ORAL | 0 refills | Status: DC

## 2017-09-24 NOTE — Discharge Instructions (Signed)
Vaginal Delivery, Care After °Refer to this sheet in the next few weeks. These instructions provide you with information about caring for yourself after vaginal delivery. Your health care provider may also give you more specific instructions. Your treatment has been planned according to current medical practices, but problems sometimes occur. Call your health care provider if you have any problems or questions. °What can I expect after the procedure? °After vaginal delivery, it is common to have: °· Some bleeding from your vagina. °· Soreness in your abdomen, your vagina, and the area of skin between your vaginal opening and your anus (perineum). °· Pelvic cramps. °· Fatigue. ° °Follow these instructions at home: °Medicines °· Take over-the-counter and prescription medicines only as told by your health care provider. °· If you were prescribed an antibiotic medicine, take it as told by your health care provider. Do not stop taking the antibiotic until it is finished. °Driving ° °· Do not drive or operate heavy machinery while taking prescription pain medicine. °· Do not drive for 24 hours if you received a sedative. °Lifestyle °· Do not drink alcohol. This is especially important if you are breastfeeding or taking medicine to relieve pain. °· Do not use tobacco products, including cigarettes, chewing tobacco, or e-cigarettes. If you need help quitting, ask your health care provider. °Eating and drinking °· Drink at least 8 eight-ounce glasses of water every day unless you are told not to by your health care provider. If you choose to breastfeed your baby, you may need to drink more water than this. °· Eat high-fiber foods every day. These foods may help prevent or relieve constipation. High-fiber foods include: °? Whole grain cereals and breads. °? Brown rice. °? Beans. °? Fresh fruits and vegetables. °Activity °· Return to your normal activities as told by your health care provider. Ask your health care provider  what activities are safe for you. °· Rest as much as possible. Try to rest or take a nap when your baby is sleeping. °· Do not lift anything that is heavier than your baby or 10 lb (4.5 kg) until your health care provider says that it is safe. °· Talk with your health care provider about when you can engage in sexual activity. This may depend on your: °? Risk of infection. °? Rate of healing. °? Comfort and desire to engage in sexual activity. °Vaginal Care °· If you have an episiotomy or a vaginal tear, check the area every day for signs of infection. Check for: °? More redness, swelling, or pain. °? More fluid or blood. °? Warmth. °? Pus or a bad smell. °· Do not use tampons or douches until your health care provider says this is safe. °· Watch for any blood clots that may pass from your vagina. These may look like clumps of dark red, brown, or black discharge. °General instructions °· Keep your perineum clean and dry as told by your health care provider. °· Wear loose, comfortable clothing. °· Wipe from front to back when you use the toilet. °· Ask your health care provider if you can shower or take a bath. If you had an episiotomy or a perineal tear during labor and delivery, your health care provider may tell you not to take baths for a certain length of time. °· Wear a bra that supports your breasts and fits you well. °· If possible, have someone help you with household activities and help care for your baby for at least a few days after   you leave the hospital. °· Keep all follow-up visits for you and your baby as told by your health care provider. This is important. °Contact a health care provider if: °· You have: °? Vaginal discharge that has a bad smell. °? Difficulty urinating. °? Pain when urinating. °? A sudden increase or decrease in the frequency of your bowel movements. °? More redness, swelling, or pain around your episiotomy or vaginal tear. °? More fluid or blood coming from your episiotomy or  vaginal tear. °? Pus or a bad smell coming from your episiotomy or vaginal tear. °? A fever. °? A rash. °? Little or no interest in activities you used to enjoy. °? Questions about caring for yourself or your baby. °· Your episiotomy or vaginal tear feels warm to the touch. °· Your episiotomy or vaginal tear is separating or does not appear to be healing. °· Your breasts are painful, hard, or turn red. °· You feel unusually sad or worried. °· You feel nauseous or you vomit. °· You pass large blood clots from your vagina. If you pass a blood clot from your vagina, save it to show to your health care provider. Do not flush blood clots down the toilet without having your health care provider look at them. °· You urinate more than usual. °· You are dizzy or light-headed. °· You have not breastfed at all and you have not had a menstrual period for 12 weeks after delivery. °· You have stopped breastfeeding and you have not had a menstrual period for 12 weeks after you stopped breastfeeding. °Get help right away if: °· You have: °? Pain that does not go away or does not get better with medicine. °? Chest pain. °? Difficulty breathing. °? Blurred vision or spots in your vision. °? Thoughts about hurting yourself or your baby. °· You develop pain in your abdomen or in one of your legs. °· You develop a severe headache. °· You faint. °· You bleed from your vagina so much that you fill two sanitary pads in one hour. °This information is not intended to replace advice given to you by your health care provider. Make sure you discuss any questions you have with your health care provider. °Document Released: 07/22/2000 Document Revised: 01/06/2016 Document Reviewed: 08/09/2015 °Elsevier Interactive Patient Education © 2018 Else[employer] Inc. ° °

## 2017-09-24 NOTE — Discharge Summary (Signed)
OB Discharge Summary     Patient Name: Christy [name] DOB: 10-May-1981 MRN: [id number]  Date of admission: 09/21/2017 Delivering MD: [name]   Date of discharge: 09/24/2017  Admitting diagnosis: 39wks ctx Intrauterine pregnancy: [redacted]w[redacted]d     Secondary diagnosis:  Principal Problem:   SVD (spontaneous vaginal delivery) Active Problems:   History of severe pre-eclampsia   History of prior pregnancy with small for gestational age newborn   Anemia complicating pregnancy   Polyhydramnios  Additional problems: none     Discharge diagnosis: Term Pregnancy Delivered                                                                                                Post partum procedures:none  Augmentation: AROM and Pitocin  Complications: None  Hospital course:  Onset of Labor With Vaginal Delivery     37 y.o. yo [id number] at [redacted]w[redacted]d was admitted in Latent Labor on 09/21/2017. Patient had an uncomplicated labor course as follows:  Membrane Rupture Time/Date: 3:56 AM ,09/23/2017   Intrapartum Procedures: Episiotomy: None [1]                                         Lacerations:  None [1]  Patient had a delivery of a Viable infant. 09/23/2017  Information for the patient's newborn:  Chandni, Gagan [[id number]]  Delivery Method: Vag-Spont    Pateint had an uncomplicated postpartum course.  She is ambulating, tolerating a regular diet, passing flatus, and urinating well. Patient is discharged home in stable condition on 09/24/17.   Physical exam  Vitals:   09/23/17 1320 09/23/17 1859 09/23/17 2150 09/24/17 0504  BP: (!) 109/59 (!) 122/58 121/66 108/61  Pulse: 66 84 90 70  Resp: 16 20 18 18   Temp: 98.4 F (36.9 C) 98 F (36.7 C) 98.4 F (36.9 C) 98.2 F (36.8 C)  TempSrc: Oral Axillary Oral Oral  SpO2:      Weight:      Height:       General: alert, cooperative and no distress Lochia: appropriate Uterine Fundus: firm Incision: N/A DVT Evaluation: No evidence of DVT  seen on physical exam. Labs: Lab Results  Component Value Date   WBC 7.0 09/21/2017   HGB 10.2 (L) 09/21/2017   HCT 31.3 (L) 09/21/2017   MCV 78.8 09/21/2017   PLT 262 09/21/2017   CMP Latest Ref Rng & Units 09/15/2017  Glucose 65 - 99 mg/dL 79  BUN 6 - 20 mg/dL 5(L)  Creatinine 0.44 - 1.00 mg/dL 0.66  Sodium 135 - 145 mmol/L 132(L)  Potassium 3.5 - 5.1 mmol/L 3.2(L)  Chloride 101 - 111 mmol/L 104  CO2 22 - 32 mmol/L 18(L)  Calcium 8.9 - 10.3 mg/dL 8.7(L)  Total Protein 6.5 - 8.1 g/dL 7.0  Total Bilirubin 0.3 - 1.2 mg/dL 0.4  Alkaline Phos 38 - 126 U/L 217(H)  AST 15 - 41 U/L 27  ALT 14 - 54 U/L 19    Discharge instruction: per After  Visit Summary and "Baby and Me Booklet".  After visit meds:  Allergies as of 09/24/2017      Reactions   Diflucan [fluconazole]    Mouth and vagina area breaks out into hives   Sulfamethoxazole-trimethoprim Hives   Latex Rash   Metronidazole Rash   If takes longer than 5 days will develop a rash      Medication List    TAKE these medications   acetaminophen 500 MG tablet Commonly known as:  TYLENOL Take 500 mg by mouth every 6 (six) hours as needed for headache.   calcium carbonate 500 MG chewable tablet Commonly known as:  TUMS - dosed in mg elemental calcium Chew 2 tablets by mouth 2 (two) times daily as needed for indigestion or heartburn.   COMFORT FIT MATERNITY SUPP LG Misc 1 Units by Does not apply route daily.   CONCEPT OB 130-92.4-1 MG Caps Take 1 capsule by mouth daily.   Ferrous Gluconate 324 (37.5 Fe) MG Tabs TAKE 1 TABLET (324 MG TOTAL) BY MOUTH DAILY   ibuprofen 600 MG tablet Commonly known as:  ADVIL,MOTRIN Take 1 tablet (600 mg total) by mouth every 6 (six) hours.   traMADol 50 MG tablet Commonly known as:  ULTRAM Take 1 tablet (50 mg total) by mouth every 6 (six) hours as needed.   TUCKS 50 % Pads Apply 1 each topically daily as needed (use daily as needed for hemorrhoids).       Diet: routine  diet  Activity: Advance as tolerated. Pelvic rest for 6 weeks.   Outpatient follow up:2 weeks Follow up Appt: Future Appointments  Date Time Provider [place]  09/27/2017  2:15 PM Katheren Shams, DO [facility] [place]  10/05/2017 11:15 AM Woodroe Mode, MD [facility]   Follow up Visit:No Follow-up on file.  Postpartum contraception: IUD Paragard  Newborn Data: Live born female  Birth Weight: 6 lb 11.1 oz (3035 g) APGAR: 9, 9  Newborn Delivery   Birth date/time:  09/23/2017 06:12:00 Delivery type:  Vaginal, Spontaneous     Baby Feeding: Bottle and Breast Disposition:home with mother   09/24/2017 Hansel Feinstein, CNM

## 2017-09-24 NOTE — Plan of Care (Signed)
Patient is progressing as expected. Patient is ambulating without any difficulty. Patient is resting in bed at this time.

## 2017-09-24 NOTE — Lactation Note (Signed)
This note was copied from a baby's chart. Lactation Consultation Note  Patient Name: Christy [name] [id number] Date: 09/24/2017 Reason for consult: Initial assessment;Infant weight loss(6% weigfht loss ) Baby is 7 hours old  LC reviewed and updated the doc flow sheets per mom.  Per mom sometimes it seem like the baby is using me like a pacifier.  Per mom breast feeding is going well and not sore.  LC discussed nutritive vs non - nutritive feeding patterns and recommended feeding STS  Until the baby can stay awake for a feeding.  LC reviewed sore nipple and engorgement prevention and tx.  LC instructed mom on the use hand pump and increased flange for when milk comes in .  Per mom increased flange before when milk comes in. Not active with [facility] but plans to call them - phone number provided.  Mother informed of post-discharge support and given phone number to the lactation department, including services for phone call assistance; out-patient appointments; and breastfeeding support group. List of other breastfeeding resources in the community given in the handout. Encouraged mother to call for problems or concerns related to breastfeeding.   Maternal Data Has patient been taught Hand Expression?: Yes(per mom comfortable ) Does the patient have breastfeeding experience prior to this delivery?: Yes  Feeding - ( latch score  Feeding Type: (per mom recently breast fed ) Length of feed: 20 min  LATCH Score Latch: Grasps breast easily, tongue down, lips flanged, rhythmical sucking.  Audible Swallowing: A few with stimulation  Type of Nipple: Everted at rest and after stimulation  Comfort (Breast/Nipple): Soft / non-tender  Hold (Positioning): No assistance needed to correctly position infant at breast.  LATCH Score: 9  Interventions Interventions: Breast feeding basics reviewed;Hand pump  Lactation Tools Discussed/Used Tools: Pump;Flanges Flange Size: 27;24;Other (comment)(#27  is for when the miok comes in ) Breast pump type: Manual WIC Program: No(per mom ) Pump Review: Setup, frequency, and cleaning;Milk Storage Initiated by:: MAI  Date initiated:: 09/24/17   Consult Status Consult Status: Complete    Jerlyn Ly Trinton Prewitt 09/24/2017, 11:43 AM

## 2017-09-24 NOTE — Progress Notes (Signed)
All discharge teaching completed with the patient. All printed discharge instructions and prescriptions given and explained to the patient. Patient verbalizes an understanding of all instructions. No questions or concerns voiced at this time.

## 2017-09-27 ENCOUNTER — Other Ambulatory Visit: Payer: Medicaid Other

## 2017-09-27 ENCOUNTER — Encounter: Payer: Medicaid Other | Admitting: Obstetrics and Gynecology

## 2017-10-04 ENCOUNTER — Encounter: Payer: Medicaid Other | Admitting: Obstetrics & Gynecology

## 2017-10-05 ENCOUNTER — Encounter: Payer: Medicaid Other | Admitting: Obstetrics & Gynecology

## 2017-10-05 ENCOUNTER — Other Ambulatory Visit: Payer: Medicaid Other

## 2017-10-13 ENCOUNTER — Ambulatory Visit: Payer: Medicaid Other | Admitting: Medical

## 2017-10-13 ENCOUNTER — Encounter: Payer: Self-pay | Admitting: Medical

## 2017-10-13 ENCOUNTER — Other Ambulatory Visit (HOSPITAL_COMMUNITY)
Admission: RE | Admit: 2017-10-13 | Discharge: 2017-10-13 | Disposition: A | Discharge status: Home / Self Care | Payer: Medicaid Other | Source: Ambulatory Visit | Attending: Medical | Admitting: Medical

## 2017-10-13 VITALS — BP 123/75 | HR 81 | Ht 64.0 in | Wt 189.5 lb

## 2017-10-13 DIAGNOSIS — N898 Other specified noninflammatory disorders of vagina: Secondary | ICD-10-CM | POA: Insufficient documentation

## 2017-10-13 DIAGNOSIS — N939 Abnormal uterine and vaginal bleeding, unspecified: Secondary | ICD-10-CM

## 2017-10-13 NOTE — Progress Notes (Signed)
[name] is a 37 y.o. [id number] who is ~ 3 weeks PP who presents to the [facility] today with vaginal odor. She is still having scant bleeding from the recent delivery. She is also breastfeeding. She denies abdominal pain or fever.   BP 123/75   Pulse 81   Ht 5\' 4"  (1.626 m)   Wt 189 lb 8 oz (86 kg)   LMP 12/20/2016 (Approximate)   Breastfeeding? Yes   BMI 32.53 kg/m  Physical Exam  Constitutional: She is oriented to person, place, and time. She appears well-developed and well-nourished. No distress.  Cardiovascular: Normal rate.  Respiratory: Effort normal.  GI: Soft. She exhibits no distension and no mass. There is no tenderness. There is no rebound and no guarding.  Genitourinary: There is bleeding (scant, brown) in the vagina. No vaginal discharge found.  Neurological: She is alert and oriented to person, place, and time.  Skin: Skin is warm and dry. No erythema.  Psychiatric: She has a normal mood and affect.   A: Vaginal odor  P:  Discharge home Wet prep sent. Will await results Warning signs for worsening condition discussed Patient advised to follow-up with [facility] as scheduled for her routine PP exam Patient may return sooner PRN  Luvenia Redden, PA-C 10/13/2017 3:17 PM

## 2017-10-13 NOTE — Patient Instructions (Signed)
Postpartum Care After Vaginal Delivery °The period of time right after you deliver your newborn is called the postpartum period. °What kind of medical care will I receive? °· You may continue to receive fluids and medicines through an IV tube inserted into one of your veins. °· If an incision was made near your vagina (episiotomy) or if you had some vaginal tearing during delivery, cold compresses may be placed on your episiotomy or your tear. This helps to reduce pain and swelling. °· You may be given a squirt bottle to use when you go to the bathroom. You may use this until you are comfortable wiping as usual. To use the squirt bottle, follow these steps: °? Before you urinate, fill the squirt bottle with warm water. Do not use hot water. °? After you urinate, while you are sitting on the toilet, use the squirt bottle to rinse the area around your urethra and vaginal opening. This rinses away any urine and blood. °? You may do this instead of wiping. As you start healing, you may use the squirt bottle before wiping yourself. Make sure to wipe gently. °? Fill the squirt bottle with clean water every time you use the bathroom. °· You will be given sanitary pads to wear. °How can I expect to feel? °· You may not feel the need to urinate for several hours after delivery. °· You will have some soreness and pain in your abdomen and vagina. °· If you are breastfeeding, you may have uterine contractions every time you breastfeed for up to several weeks postpartum. Uterine contractions help your uterus return to its normal size. °· It is normal to have vaginal bleeding (lochia) after delivery. The amount and appearance of lochia is often similar to a menstrual period in the first week after delivery. It will gradually decrease over the next few weeks to a dry, yellow-brown discharge. For most women, lochia stops completely by 6-8 weeks after delivery. Vaginal bleeding can vary from woman to woman. °· Within the first few  days after delivery, you may have breast engorgement. This is when your breasts feel heavy, full, and uncomfortable. Your breasts may also throb and feel hard, tightly stretched, warm, and tender. After this occurs, you may have milk leaking from your breasts. Your health care provider can help you relieve discomfort due to breast engorgement. Breast engorgement should go away within a few days. °· You may feel more sad or worried than normal due to hormonal changes after delivery. These feelings should not last more than a few days. If these feelings do not go away after several days, speak with your health care provider. °How should I care for myself? °· Tell your health care provider if you have pain or discomfort. °· Drink enough water to keep your urine clear or pale yellow. °· Wash your hands thoroughly with soap and water for at least 20 seconds after changing your sanitary pads, after using the toilet, and before holding or feeding your baby. °· If you are not breastfeeding, avoid touching your breasts a lot. Doing this can make your breasts produce more milk. °· If you become weak or lightheaded, or you feel like you might faint, ask for help before: °? Getting out of bed. °? Showering. °· Change your sanitary pads frequently. Watch for any changes in your flow, such as a sudden increase in volume, a change in color, the passing of large blood clots. If you pass a blood clot from your vagina, save it   to show to your health care provider. Do not flush blood clots down the toilet without having your health care provider look at them. °· Make sure that all your vaccinations are up to date. This can help protect you and your baby from getting certain diseases. You may need to have immunizations done before you leave the hospital. °· If desired, talk with your health care provider about methods of family planning or birth control (contraception). °How can I start bonding with my baby? °Spending as much time as  possible with your baby is very important. During this time, you and your baby can get to know each other and develop a bond. Having your baby stay with you in your room (rooming in) can give you time to get to know your baby. Rooming in can also help you become comfortable caring for your baby. Breastfeeding can also help you bond with your baby. °How can I plan for returning home with my baby? °· Make sure that you have a car seat installed in your vehicle. °? Your car seat should be checked by a certified car seat installer to make sure that it is installed safely. °? Make sure that your baby fits into the car seat safely. °· Ask your health care provider any questions you have about caring for yourself or your baby. Make sure that you are able to contact your health care provider with any questions after leaving the hospital. °This information is not intended to replace advice given to you by your health care provider. Make sure you discuss any questions you have with your health care provider. °Document Released: 05/22/2007 Document Revised: 12/28/2015 Document Reviewed: 06/29/2015 °Elsevier Interactive Patient Education © 2018 [employer] Inc[employer] ° °

## 2017-10-16 LAB — CERVICOVAGINAL ANCILLARY ONLY
BACTERIAL VAGINITIS: NEGATIVE
CANDIDA VAGINITIS: NEGATIVE
Chlamydia: NEGATIVE
Neisseria Gonorrhea: NEGATIVE
TRICH (WINDOWPATH): NEGATIVE

## 2017-10-18 ENCOUNTER — Telehealth: Payer: Self-pay

## 2017-10-18 DIAGNOSIS — N760 Acute vaginitis: Principal | ICD-10-CM

## 2017-10-18 DIAGNOSIS — B9689 Other specified bacterial agents as the cause of diseases classified elsewhere: Secondary | ICD-10-CM

## 2017-10-18 MED ORDER — CLINDAMYCIN PHOSPHATE 2 % VA CREA: 1.0000 | TOPICAL_CREAM | Freq: Every day | VAGINAL | 0 refills | Status: AC

## 2017-10-18 NOTE — Telephone Encounter (Signed)
Patient called into front office stating she has a vaginal infection and doesn't understand why we are telling her she doesn't have any infection. Patient was yelling and interrupting when I tried to speak with her. Patient states she could get pelvic inflammatory disease and this is dangerous [place] not wanting to treat her. Patient states she is having a green/brown discharge and it smells fishy and like a dead animal. Per Almyra Free may prescribe cleocin 1 applicator at bedtime for 3 days due to negative wet prep. Discussed with patient we can send in a prescription for a vaginal cream to her pharmacy. Patient asked if it was safe for breastfeeding and I told her it was. Patient verbalized understanding & had no questions

## 2017-10-18 NOTE — Telephone Encounter (Signed)
Pt called the office for test results from 10/13/17. Pt stated that she was supposed to receive a call back and no one called her. I informed pt that everything that she was tested for came back negative. Pt demanded that I put the supervisor on the phone.

## 2017-10-31 ENCOUNTER — Ambulatory Visit (INDEPENDENT_AMBULATORY_CARE_PROVIDER_SITE_OTHER): Payer: Medicaid Other | Admitting: Family Medicine

## 2017-10-31 ENCOUNTER — Encounter: Payer: Self-pay | Admitting: Family Medicine

## 2017-10-31 DIAGNOSIS — Z3202 Encounter for pregnancy test, result negative: Secondary | ICD-10-CM | POA: Diagnosis not present

## 2017-10-31 DIAGNOSIS — Z30430 Encounter for insertion of intrauterine contraceptive device: Secondary | ICD-10-CM | POA: Diagnosis not present

## 2017-10-31 DIAGNOSIS — Z1389 Encounter for screening for other disorder: Secondary | ICD-10-CM | POA: Diagnosis not present

## 2017-10-31 DIAGNOSIS — Z975 Presence of (intrauterine) contraceptive device: Secondary | ICD-10-CM | POA: Insufficient documentation

## 2017-10-31 LAB — POCT PREGNANCY, URINE: Preg Test, Ur: NEGATIVE

## 2017-10-31 MED ORDER — LEVONORGESTREL 19.5 MCG/DAY IU IUD
INTRAUTERINE_SYSTEM | Freq: Once | INTRAUTERINE | Status: AC
Start: 2017-10-31 — End: 2017-10-31
  Administered 2017-10-31: 12:00:00 via INTRAUTERINE

## 2017-10-31 NOTE — Progress Notes (Signed)
Subjective:     Christy [name] is a 37 y.o. female who presents for a postpartum visit. She is 5 weeks postpartum following a spontaneous vaginal delivery. I have fully reviewed the prenatal and intrapartum course. The delivery was at 54 gestational weeks. Outcome: spontaneous vaginal delivery. Anesthesia: epidural. Postpartum course has been uncomplicated. Baby's course has been uncomplicated. Baby is feeding by breast. Bleeding brown. Bowel function is normal. Bladder function is normal. Patient is not sexually active. Contraception method is none; would like IUD. Postpartum depression screening: negative.  The following portions of the patient's history were reviewed and updated as appropriate: allergies, current medications, past family history, past medical history, past social history, past surgical history and problem list.  Review of Systems Pertinent items noted in HPI and remainder of comprehensive ROS otherwise negative.   Objective:    BP 126/82   Pulse 77   Ht 5\' 5"  (1.651 m)   Wt 196 lb 8 oz (89.1 kg)   LMP 12/20/2016 (Approximate)   BMI 32.70 kg/m    General:  Well-appearing female in NAD   HENT:  [place]/AT, normal oral mucosa  Eyes: Normal conjunctivae, no scleral icterus  Lungs:  Normal respiratory effort  Heart: Normal rate  Abdomen: Soft, ND, ND, +BS  Skin : Warm, dry.   MSK: No LE edema  Psych: Normal mood, appropriate affect  Neuro:  Alert and oriented x3        Pelvic: NEFG. Normal Uterus slightly anteverted on bimanual exam. Vaginal mucosa and cervix normal appearing w/o lesions or abnormalities. IUD inserted (see procedure note).  UPT: negative  Assessment:     Normal postpartum exam. Pap smear not done at today's visit.   IUD inserted  Plan:    1. Contraception: IUD (Lilleta) inserted today 2. Follow up in: 4 -6 weeks for IUD string check.   Christy Free P. Degele, MD OB Fellow  Future Appointments  Date Time Provider Golf  11/29/2017  9:55  AM Stann Mainland Katheran James, CNM [place]

## 2017-10-31 NOTE — Patient Instructions (Signed)
IUD PLACEMENT POST-PROCEDURE INSTRUCTIONS  1. You may take Ibuprofen, Aleve or Tylenol for pain if needed.  Cramping should resolve within in 24 hours.  2. You may have a small amount of spotting.  You should wear a mini pad for the next few days.  3. You may have intercourse after 24 hours (although I recommend waiting 7 days).  If you using this for birth control, it is effective immediately.  4. You need to call if you have any pelvic pain, fever, heavy bleeding or foul smelling vaginal discharge.  Irregular bleeding is common the first several months after having an IUD placed. You do not need to call for this reason unless you are concerned.  5. Shower or bathe as normal  6. You should have a follow-up appointment in 4-8 weeks for a re-check to make sure you are not having any problems.

## 2017-10-31 NOTE — Progress Notes (Signed)
    GYNECOLOGY OFFICE PROCEDURE NOTE  Christy [name] is a 37 y.o. [id number] here for [place] IUD insertion. No GYN concerns.  Last pap smear was on 10/14/2015 and was normal.  IUD Insertion Procedure Note  Patient identified, informed consent performed, consent signed.   Discussed risks of irregular bleeding, cramping, infection, malpositioning or misplacement of the IUD outside the uterus which may require further procedure such as laparoscopy. Time out was performed.  Urine pregnancy test negative.  Speculum placed in the vagina.  Cervix visualized.  Cleaned with Betadine x 2.  Grasped anteriorly with a single tooth tenaculum.  Uterus sounded to 9 cm.  Liletta IUD placed per manufacturer's recommendations.  Strings trimmed to 3 cm. Tenaculum was removed, good hemostasis noted.  Patient tolerated procedure well.   Patient was given post-procedure instructions.  She was advised to have backup contraception for one week.  Patient was also asked to check IUD strings periodically and follow up in 4-6 weeks for IUD check.  Lot #[id number]  Jenne Pane. Neyah Ellerman, MD OB Fellow

## 2017-11-06 ENCOUNTER — Telehealth: Payer: Self-pay | Admitting: General Practice

## 2017-11-06 ENCOUNTER — Encounter: Payer: Self-pay | Admitting: *Deleted

## 2017-11-06 NOTE — Telephone Encounter (Signed)
Patient called into front office stating there is a problem with her medication at the pharmacy. Called the pharmacy who states a PA is needed on the clindamycin gel. Called medicaid & PA is pending for 24 hours. Called patient, no answer- left message stating I am trying to reach you to return your phone call. I have contacted your pharmacy and the medication will need another 24 hours to process. You may call back if you have questions

## 2017-11-07 ENCOUNTER — Encounter: Payer: Self-pay | Admitting: *Deleted

## 2017-11-16 ENCOUNTER — Telehealth: Payer: Self-pay | Admitting: General Practice

## 2017-11-16 NOTE — Telephone Encounter (Signed)
Patient called into front office requesting an update on her clindamycin gel Rx. Discussed with patient I called her insurance company the day she called on the 1st and provided clinical

## 2017-11-29 ENCOUNTER — Ambulatory Visit: Payer: Medicaid Other | Admitting: Medical

## 2017-12-12 ENCOUNTER — Telehealth: Payer: Self-pay | Admitting: General Practice

## 2017-12-12 ENCOUNTER — Encounter (HOSPITAL_BASED_OUTPATIENT_CLINIC_OR_DEPARTMENT_OTHER): Payer: Self-pay | Admitting: *Deleted

## 2017-12-12 ENCOUNTER — Emergency Department (HOSPITAL_BASED_OUTPATIENT_CLINIC_OR_DEPARTMENT_OTHER)
Admission: EM | Admit: 2017-12-12 | Discharge: 2017-12-12 | Disposition: A | Discharge status: Home / Self Care | Payer: Medicaid Other | Attending: Emergency Medicine | Admitting: Emergency Medicine

## 2017-12-12 ENCOUNTER — Other Ambulatory Visit: Payer: Self-pay

## 2017-12-12 ENCOUNTER — Emergency Department (HOSPITAL_COMMUNITY): Admission: EM | Admit: 2017-12-12 | Payer: Medicaid Other | Source: Home / Self Care

## 2017-12-12 DIAGNOSIS — Z5321 Procedure and treatment not carried out due to patient leaving prior to being seen by health care provider: Secondary | ICD-10-CM | POA: Diagnosis not present

## 2017-12-12 DIAGNOSIS — R51 Headache: Secondary | ICD-10-CM | POA: Insufficient documentation

## 2017-12-12 NOTE — Telephone Encounter (Signed)
Patient called into front office stating for the past week she has had off and on headaches and constant facial numbness. Patient states the numbness starts under her eyes and goes down to her chin. Patient states it kind of feels like when you go to the dentist and they numb you. Patient states if she lays down and rests it goes away & she feels better but whenever she gets up and becomes active again it all comes back. Patient states she has been particularly stressed lately and wonders if it's related to that. Discussed with patient this isn't related to pregnancy/post partum period and recommended she go to her primary care doctor or [facility] ED- also offered appt with Roselyn Reef. Patient verbalized understanding and states she is coming in tonight during walk in hours for a string check so she will make an appt with Roselyn Reef then. Patient had no other questions at this time.

## 2017-12-12 NOTE — ED Notes (Signed)
Pt seen driving out of ER parking lot by this rn. Pt did not notify staff that she was leaving.

## 2017-12-12 NOTE — ED Notes (Signed)
Pt does not want to wait and decided to go to [facility]. This tech encouraged her to stay.

## 2017-12-12 NOTE — ED Triage Notes (Signed)
Numbness in the left side of her face x 4 days. She has had a headache. States her she knows what she wants to say but her words come out wrong. Needle like pain in her left arm and hand.

## 2017-12-13 ENCOUNTER — Other Ambulatory Visit: Payer: Self-pay

## 2017-12-13 ENCOUNTER — Ambulatory Visit: Payer: Self-pay | Admitting: Family Medicine

## 2017-12-13 ENCOUNTER — Encounter: Payer: Self-pay | Admitting: Family Medicine

## 2017-12-13 ENCOUNTER — Ambulatory Visit (INDEPENDENT_AMBULATORY_CARE_PROVIDER_SITE_OTHER): Payer: Medicaid Other | Admitting: Family Medicine

## 2017-12-13 VITALS — BP 132/88 | HR 84 | Temp 98.5°F | Resp 18 | Ht 64.0 in | Wt 194.6 lb

## 2017-12-13 DIAGNOSIS — R51 Headache: Secondary | ICD-10-CM

## 2017-12-13 DIAGNOSIS — R4781 Slurred speech: Secondary | ICD-10-CM | POA: Diagnosis not present

## 2017-12-13 DIAGNOSIS — R519 Headache, unspecified: Secondary | ICD-10-CM

## 2017-12-13 DIAGNOSIS — R200 Anesthesia of skin: Secondary | ICD-10-CM | POA: Diagnosis not present

## 2017-12-13 DIAGNOSIS — R29898 Other symptoms and signs involving the musculoskeletal system: Secondary | ICD-10-CM | POA: Diagnosis not present

## 2017-12-13 MED ORDER — ASPIRIN EC 81 MG PO TBEC: 81.0000 mg | DELAYED_RELEASE_TABLET | Freq: Every day | ORAL | 2 refills | Status: DC

## 2017-12-13 NOTE — Patient Instructions (Signed)
     IF you received an x-ray today, you will receive an invoice from [facility]. Please contact [facility] at [phone] with questions or concerns regarding your invoice.   IF you received labwork today, you will receive an invoice from [facility]. Please contact [facility] at [phone] with questions or concerns regarding your invoice.   Our billing staff will not be able to assist you with questions regarding bills from these companies.  You will be contacted with the lab results as soon as they are available. The fastest way to get your results is to activate your My Chart account. Instructions are located on the last page of this paperwork. If you have not heard from us regarding the results in 2 weeks, please contact this office.     

## 2017-12-13 NOTE — Progress Notes (Signed)
5/8/20192:05 PM  Christy [name] [birth year], 37 y.o. female [id number]  Chief Complaint  Patient presents with  . Numbness    numbness on left side of face and also having a headache and blurred vision     HPI:   Patient is a 37 y.o. female who presents today for acute onset of headache, left facial numbness, slurred speech and left arm weakness  She reports headache and left facial numbness started 6 days ago, became worse the following day, thought it might be a migraine (though she has NO history of migraines or any other type of headache), took OTC meds, did not help.  Then yesterday her speech became slurred, could not get words out, lost her train of thought and her left arm became weak .she went to the ER but left without being seen as the wait was long Symptoms are resolving Today still having left facial numbness, mostly only with activity, and mild head pressure, no enough to call it a headache She has intermittent blurry vision, feels as is she wants to take off her glasses, not so much photophobia Denies photophobia Headache is frontal/temporal, across, pressure   PP, SVD on 09/23/17, breastfeeding Liletta IUD placed 10/31/17 Severe pre-eclampsia in 2017  [place]: Dad MI, multiple CVA, HTN  Denies any fever, chills, chest pain, SOB, palpitations, jaw pain, nausea or vomiting Does not smoke. No etoh or illicit drug use Increased stress at work  Fall Risk  06/20/2017 05/09/2017  Falls in the past year? No No     Depression screen [facility] 2/9 10/31/2017 07/14/2017 02/15/2017  Decreased Interest 0 0 0  Down, Depressed, Hopeless 0 0 0  PHQ - 2 Score 0 0 0  Altered sleeping 0 0 0  Tired, decreased energy 0 0 0  Change in appetite 0 0 0  Feeling bad or failure about yourself  0 0 0  Trouble concentrating 0 0 0  Moving slowly or fidgety/restless 0 0 0  Suicidal thoughts 0 0 0  PHQ-9 Score 0 0 0    Allergies  Allergen Reactions  . Diflucan [Fluconazole]     Mouth and  vagina area breaks out into hives  . Sulfamethoxazole-Trimethoprim Hives  . Latex Rash  . Metronidazole Rash    If takes longer than 5 days will develop a rash    Prior to Admission medications   Medication Sig Start Date End Date Taking? Authorizing Provider  acetaminophen (TYLENOL) 500 MG tablet Take 500 mg by mouth every 6 (six) hours as needed for headache.   Yes [provider]  calcium carbonate (TUMS - DOSED IN MG ELEMENTAL CALCIUM) 500 MG chewable tablet Chew 2 tablets by mouth 2 (two) times daily as needed for indigestion or heartburn.   Yes [provider]  ibuprofen (ADVIL,MOTRIN) 600 MG tablet Take 1 tablet (600 mg total) by mouth every 6 (six) hours. 09/24/17  Yes Seabron Spates, CNM  Prenat w/o A Vit-FeFum-FePo-FA (CONCEPT OB) 130-92.4-1 MG CAPS Take 1 capsule by mouth daily. 07/20/17  Yes Leftwich-Kirby, Kathie Dike, CNM  Witch Hazel (TUCKS) 50 % PADS Apply 1 each topically daily as needed (use daily as needed for hemorrhoids). 09/06/17  Yes Donnamae Jude, MD    Past Medical History:  Diagnosis Date  . Anemia   . Preeclampsia, severe 03/29/2016    Past Surgical History:  Procedure Laterality Date  . NO PAST SURGERIES      Social History   Tobacco Use  . Smoking status:  Never Smoker  . Smokeless tobacco: Never Used  Substance Use Topics  . Alcohol use: No    Family History  Problem Relation Age of Onset  . Hypertension Mother   . Hypertension Father   . Heart attack Father   . Stroke Father   . Hypertension Sister   . Hypertension Maternal Grandmother     ROS Per hpi  OBJECTIVE:  Blood pressure 132/88, pulse 84, temperature 98.5 F (36.9 C), temperature source Oral, resp. rate 18, height 5\' 4"  (1.626 m), weight 194 lb 9.6 oz (88.3 kg), last menstrual period 11/14/2017, SpO2 100 %, currently breastfeeding.    Visual Acuity Screening   Right eye Left eye Both eyes  Without correction:     With correction: 20/15-1 20/20 20/15-1    Physical Exam  Constitutional: She is oriented to person, place, and time. She appears well-developed and well-nourished.  HENT:  Head: Normocephalic and atraumatic.  Right Ear: Hearing, tympanic membrane, external ear and ear canal normal.  Left Ear: Hearing, tympanic membrane, external ear and ear canal normal.  Mouth/Throat: Oropharynx is clear and moist.  Eyes: Pupils are equal, round, and reactive to light. Conjunctivae and EOM are normal.  Neck: Neck supple. No thyromegaly present.  Cardiovascular: Normal rate, regular rhythm, normal heart sounds and intact distal pulses. Exam reveals no gallop and no friction rub.  No murmur heard. TA with palpable pulses , non indurated nor tender  Pulmonary/Chest: Effort normal and breath sounds normal. She has no wheezes. She has no rales.  Abdominal: Soft. Bowel sounds are normal. She exhibits no distension and no mass. There is no tenderness.  Musculoskeletal: Normal range of motion. She exhibits no edema.  Lymphadenopathy:    She has no cervical adenopathy.  Neurological: She is alert and oriented to person, place, and time. She has normal strength and normal reflexes. No cranial nerve deficit or sensory deficit. She displays a negative Romberg sign. Coordination and gait normal.  Skin: Skin is warm and dry.  Psychiatric: She has a normal mood and affect.  Nursing note and vitals reviewed.   ASSESSMENT and PLAN  1. Acute nonintractable headache, unspecified headache type Symptoms resolving, no prior history, normal neuro exam. DDX: complex migraine, TIA, psuedotumor cerebrii. Starting ASA 81mg  given strong fhx CVD. MRI and neuro eval. ER precautions reviewed.  - MR Brain W Wo Contrast; Future - Ambulatory referral to Neurology  2. Left facial numbness - Ambulatory referral to Neurology  3. Left arm weakness - Ambulatory referral to Neurology  4. Slurred speech - Ambulatory referral to Neurology  Other orders - aspirin EC 81 MG  tablet; Take 1 tablet (81 mg total) by mouth daily.  Return for after neuro.    Rutherford Guys, MD [facility] [place], [place] [address] Ph.  [phone] Fax [phone]

## 2017-12-14 ENCOUNTER — Telehealth: Payer: Self-pay | Admitting: Family Medicine

## 2017-12-14 ENCOUNTER — Encounter: Payer: Self-pay | Admitting: Family Medicine

## 2017-12-14 NOTE — Telephone Encounter (Signed)
Please complete the notes on this patient due to insurance needing more information to complete clinical review/auth.   Thank you

## 2017-12-14 NOTE — Telephone Encounter (Signed)
Done.  Sorry.

## 2017-12-18 ENCOUNTER — Ambulatory Visit
Admission: RE | Admit: 2017-12-18 | Discharge: 2017-12-18 | Disposition: A | Discharge status: Home / Self Care | Payer: Medicaid Other | Source: Ambulatory Visit | Attending: Family Medicine | Admitting: Family Medicine

## 2017-12-18 DIAGNOSIS — R51 Headache: Principal | ICD-10-CM

## 2017-12-18 DIAGNOSIS — R519 Headache, unspecified: Secondary | ICD-10-CM

## 2017-12-18 MED ORDER — GADOBENATE DIMEGLUMINE 529 MG/ML IV SOLN
18.0000 mL | Freq: Once | INTRAVENOUS | Status: AC | PRN
  Administered 2017-12-18: 18 mL via INTRAVENOUS

## 2018-09-11 ENCOUNTER — Ambulatory Visit (HOSPITAL_COMMUNITY)
Admission: EM | Admit: 2018-09-11 | Discharge: 2018-09-11 | Disposition: A | Discharge status: Home / Self Care | Payer: Medicaid Other | Attending: Family Medicine | Admitting: Family Medicine

## 2018-09-11 ENCOUNTER — Encounter (HOSPITAL_COMMUNITY): Payer: Self-pay | Admitting: Emergency Medicine

## 2018-09-11 ENCOUNTER — Other Ambulatory Visit: Payer: Self-pay

## 2018-09-11 DIAGNOSIS — G44209 Tension-type headache, unspecified, not intractable: Secondary | ICD-10-CM

## 2018-09-11 DIAGNOSIS — G4452 New daily persistent headache (NDPH): Secondary | ICD-10-CM | POA: Diagnosis not present

## 2018-09-11 DIAGNOSIS — M7918 Myalgia, other site: Secondary | ICD-10-CM | POA: Diagnosis not present

## 2018-09-11 MED ORDER — CYCLOBENZAPRINE HCL 5 MG PO TABS: 5.0000 mg | ORAL_TABLET | Freq: Three times a day (TID) | ORAL | 0 refills | Status: DC | PRN

## 2018-09-11 MED ORDER — NAPROXEN 500 MG PO TABS: 500.0000 mg | ORAL_TABLET | Freq: Two times a day (BID) | ORAL | 0 refills | Status: DC

## 2018-09-11 NOTE — Discharge Instructions (Addendum)
Use warmth to area Gentle stretching and massage of muscles Take the naproxen 2 x a day with food Take the cyclobenzaprine 1-2 at night.  May take during day if needed If pain medicine needed in addition take Excedrin migraine Try to make more time for yourself

## 2018-09-11 NOTE — ED Provider Notes (Signed)
[facility]    CSN: [id number] Arrival date & time: 09/11/18  1602     History   Chief Complaint Chief Complaint  Patient presents with  . Hypertension    HPI Christy [name] is a 38 y.o. female.   HPI  Patient is here for high blood pressure.  She thinks her blood pressure elevated because she is been having headaches.  She is taking at home it was 130/90.  Blood pressure here is normal.  She is been having daily headaches.  Is like a band around her forehead.  It starts off in the back of her neck.  She has stiffness and soreness in her neck and shoulder muscles.  This is been going on for couple of months.  She states that she is under a lot of stress.  Recent death in the family.  Works.  A children at home.  She states that she does not feel like she has any time for herself.  She has been taking ibuprofen 800 mg, Tylenol, and then Benadryl at night.  In spite of this she has a headache every day.  She has to take pain medication every day.  No history of migraines or headache condition.  No fall or injury.  No sinus problems.  Past Medical History:  Diagnosis Date  . Anemia   . Preeclampsia, severe 03/29/2016    Patient Active Problem List   Diagnosis Date Noted  . IUD (intrauterine device) in place 10/31/2017  . SVD (spontaneous vaginal delivery) 09/22/2017  . Noncompliance 07/26/2017  . Anemia complicating pregnancy 60/63/0160  . Supervision of other normal pregnancy, antepartum 05/01/2017  . Encounter for supervision of multigravida with advanced maternal age 66/03/2016    Past Surgical History:  Procedure Laterality Date  . NO PAST SURGERIES      OB History    Gravida  9   Para  8   Term  7   Preterm  1   AB  1   Living  8     SAB  1   TAB  0   Ectopic  0   Multiple  0   Live Births  8        Obstetric Comments  03/30/2016 Induced for severe preeclampsia         Home Medications    Prior to Admission medications     Medication Sig Start Date End Date Taking? Authorizing Provider  acetaminophen (TYLENOL) 500 MG tablet Take 500 mg by mouth every 6 (six) hours as needed for headache.   Yes [provider]  cyclobenzaprine (FLEXERIL) 5 MG tablet Take 1 tablet (5 mg total) by mouth 3 (three) times daily as needed for muscle spasms. 09/11/18   Raylene Everts, MD  naproxen (NAPROSYN) 500 MG tablet Take 1 tablet (500 mg total) by mouth 2 (two) times daily. 09/11/18   Raylene Everts, MD  Witch Hazel (TUCKS) 50 % PADS Apply 1 each topically daily as needed (use daily as needed for hemorrhoids). 09/06/17   Donnamae Jude, MD    Family History Family History  Problem Relation Age of Onset  . Hypertension Mother   . Hypertension Father   . Heart attack Father   . Stroke Father   . Hypertension Sister   . Hypertension Maternal Grandmother     Social History Social History   Tobacco Use  . Smoking status: Never Smoker  . Smokeless tobacco: Never Used  Substance Use Topics  .  Alcohol use: No  . Drug use: No     Allergies   Diflucan [fluconazole]; Sulfamethoxazole-trimethoprim; Latex; and Metronidazole   Review of Systems Review of Systems  Constitutional: Negative for chills and fever.  HENT: Negative for ear pain and sore throat.   Eyes: Negative for pain and visual disturbance.  Respiratory: Negative for cough and shortness of breath.   Cardiovascular: Negative for chest pain and palpitations.  Gastrointestinal: Negative for abdominal pain and vomiting.  Genitourinary: Negative for dysuria and hematuria.  Musculoskeletal: Positive for neck pain and neck stiffness. Negative for arthralgias and back pain.  Skin: Negative for color change and rash.  Neurological: Positive for headaches. Negative for seizures and syncope.  Psychiatric/Behavioral: Positive for sleep disturbance.  All other systems reviewed and are negative.    Physical Exam Triage Vital Signs ED Triage Vitals   Enc Vitals Group     BP 09/11/18 1636 128/62     Pulse Rate 09/11/18 1636 (!) 104     Resp 09/11/18 1636 20     Temp 09/11/18 1636 98.9 F (37.2 C)     Temp Source 09/11/18 1636 Oral     SpO2 09/11/18 1636 100 %     Weight --      Height --      Head Circumference --      Peak Flow --      Pain Score 09/11/18 1633 9     Pain Loc --      Pain Edu? --      Excl. in [place]? --    No data found.  Updated Vital Signs BP 128/62 (BP Location: Right Arm) Comment (BP Location): large cuff  Pulse (!) 104   Temp 98.9 F (37.2 C) (Oral)   Resp 20   LMP 09/11/2018   SpO2 100%   Visual Acuity Right Eye Distance:   Left Eye Distance:   Bilateral Distance:    Right Eye Near:   Left Eye Near:    Bilateral Near:     Physical Exam Constitutional:      General: She is not in acute distress.    Appearance: She is well-developed and normal weight.  HENT:     Head: Normocephalic and atraumatic.     Right Ear: Tympanic membrane, ear canal and external ear normal.     Left Ear: Tympanic membrane, ear canal and external ear normal.     Nose: Nose normal.     Mouth/Throat:     Mouth: Mucous membranes are moist.  Eyes:     Extraocular Movements: Extraocular movements intact.     Conjunctiva/sclera: Conjunctivae normal.     Pupils: Pupils are equal, round, and reactive to light.     Comments: Disks are flat  Neck:     Musculoskeletal: Normal range of motion.     Comments: Full but slow range of motion.  Tenderness of the paraspinous cervical muscles from the occiput down to the upper body of the trapezius.  Muscle tension but no spasm palpable. Cardiovascular:     Rate and Rhythm: Normal rate and regular rhythm.  Pulmonary:     Effort: Pulmonary effort is normal. No respiratory distress.     Breath sounds: Normal breath sounds.  Abdominal:     General: There is no distension.     Palpations: Abdomen is soft.  Musculoskeletal: Normal range of motion.  Skin:    General: Skin is warm  and dry.  Neurological:     General: No  focal deficit present.     Mental Status: She is alert. Mental status is at baseline.     Coordination: Coordination normal.     Gait: Gait normal.     Deep Tendon Reflexes: Reflexes normal.  Psychiatric:        Mood and Affect: Mood normal.        Thought Content: Thought content normal.      UC Treatments / Results  Labs (all labs ordered are listed, but only abnormal results are displayed) Labs Reviewed - No data to display  EKG None  Radiology No results found.  Procedures Procedures (including critical care time)  Medications Ordered in UC Medications - No data to display  Initial Impression / Assessment and Plan / UC Course  I have reviewed the triage vital signs and the nursing notes.  Pertinent labs & imaging results that were available during my care of the patient were reviewed by me and considered in my medical decision making (see chart for details).     Reviewed muscular neck pain.  Causes of tension.  Tension headache.  Involvement of stress. Final Clinical Impressions(s) / UC Diagnoses   Final diagnoses:  Myofascial pain syndrome, cervical  Tension headache  New persistent daily headache     Discharge Instructions     Use warmth to area Gentle stretching and massage of muscles Take the naproxen 2 x a day with food Take the cyclobenzaprine 1-2 at night.  May take during day if needed If pain medicine needed in addition take Excedrin migraine Try to make more time for yourself   ED Prescriptions    Medication Sig Dispense Auth. Provider   naproxen (NAPROSYN) 500 MG tablet Take 1 tablet (500 mg total) by mouth 2 (two) times daily. 30 tablet Raylene Everts, MD   cyclobenzaprine (FLEXERIL) 5 MG tablet Take 1 tablet (5 mg total) by mouth 3 (three) times daily as needed for muscle spasms. 30 tablet Raylene Everts, MD     Controlled Substance Prescriptions [place] Controlled Substance Registry  consulted? Not Applicable   Raylene Everts, MD 09/11/18 [phone]

## 2018-09-11 NOTE — ED Triage Notes (Signed)
Stiffness and pain in back of head and neck area.  States does not feel like a migraine.  Took blood pressure reading yesterday and reports it was 138/90.  Reports difficulty sleeping and feeling tense.    Patient reports symptoms for a month.

## 2018-12-04 IMAGING — US US MFM OB DETAIL+14 WK
1 series · 14 of 28 positions shown · non-contrast
Comparison: none

[Series 1: us mfm ob detail+14 wk · 73 acquisitions, 14 frames shown]
[im 3/73]
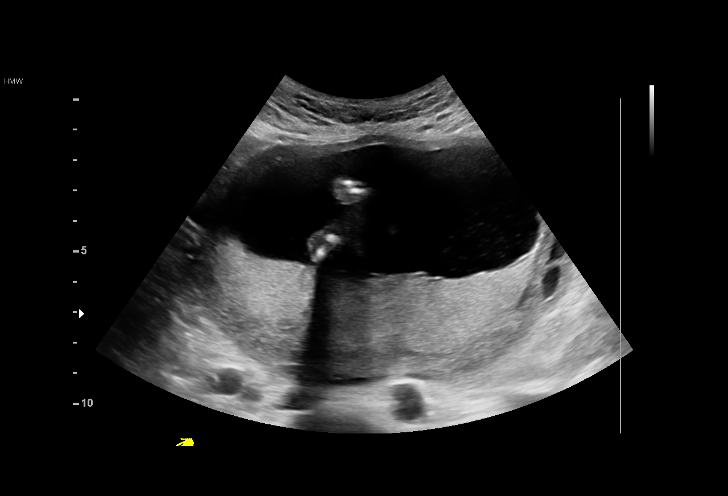
[im 9/73]
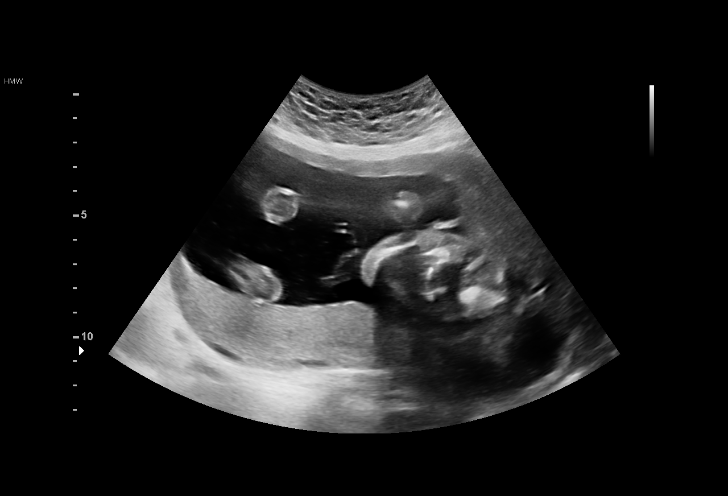
[im 14/73]
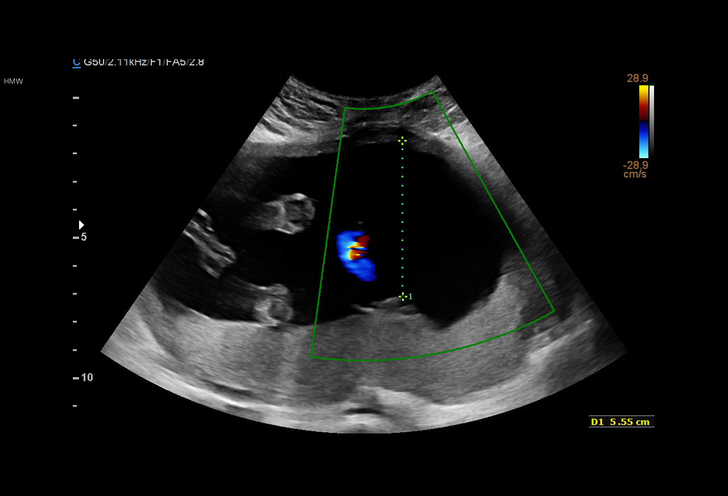
[im 19/73]
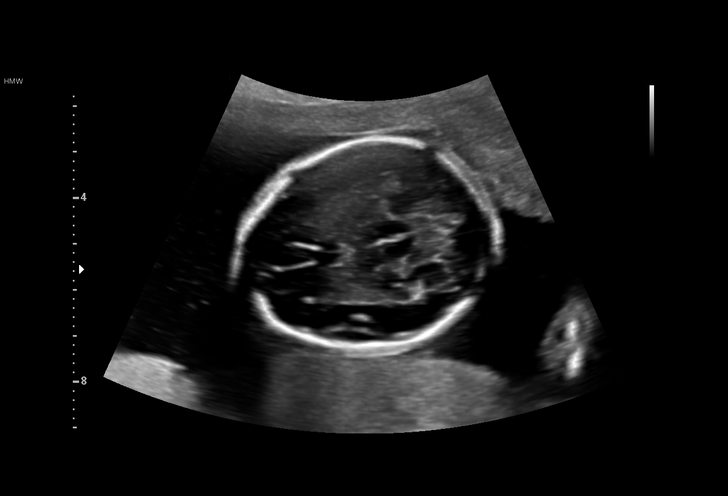
[im 25/73]
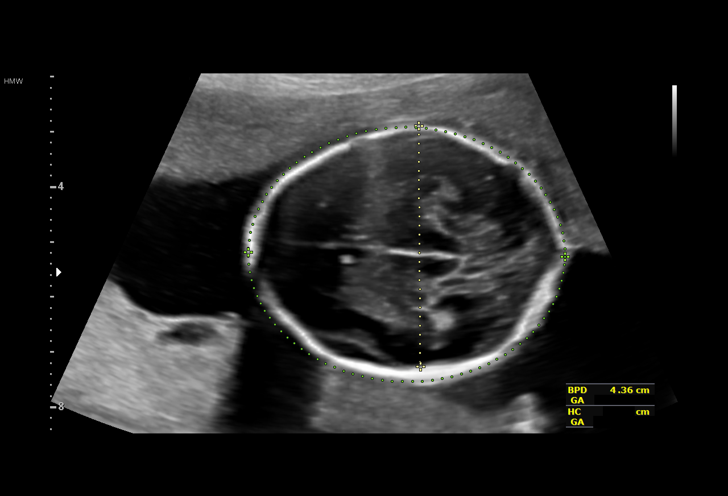
[im 30/73]
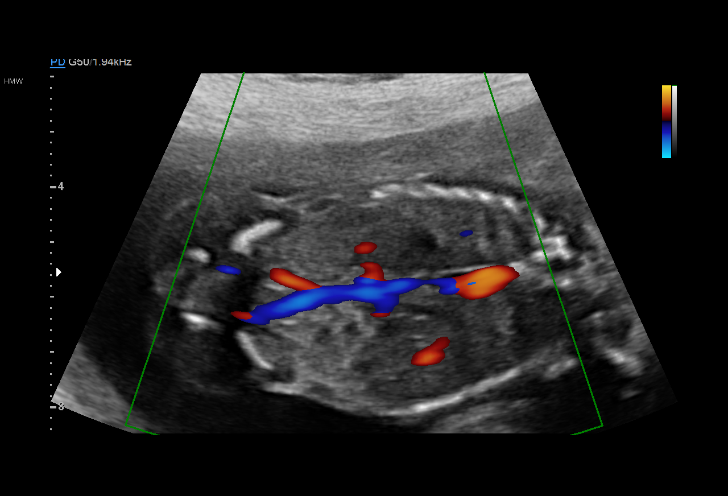
[im 35/73]
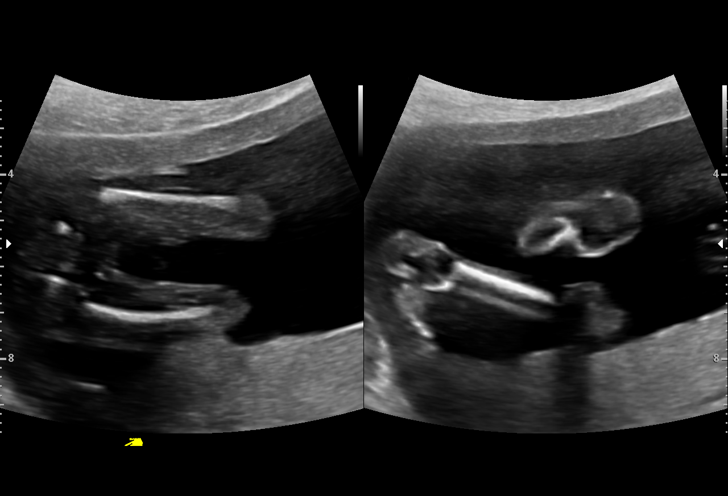
[im 41/73]
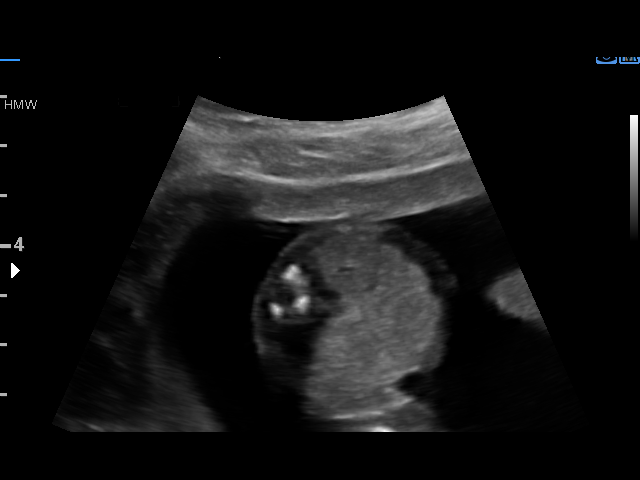
[im 46/73]
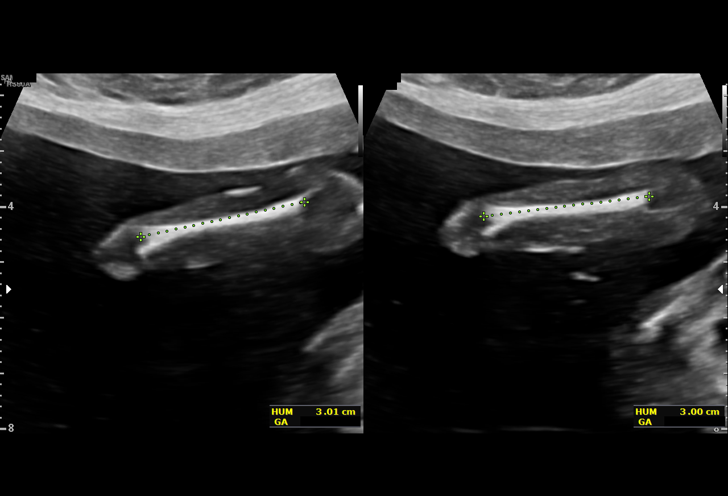
[im 51/73]
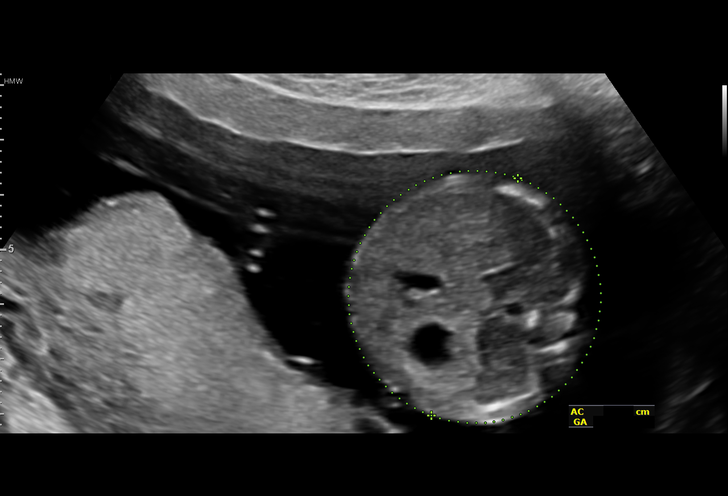
[im 57/73]
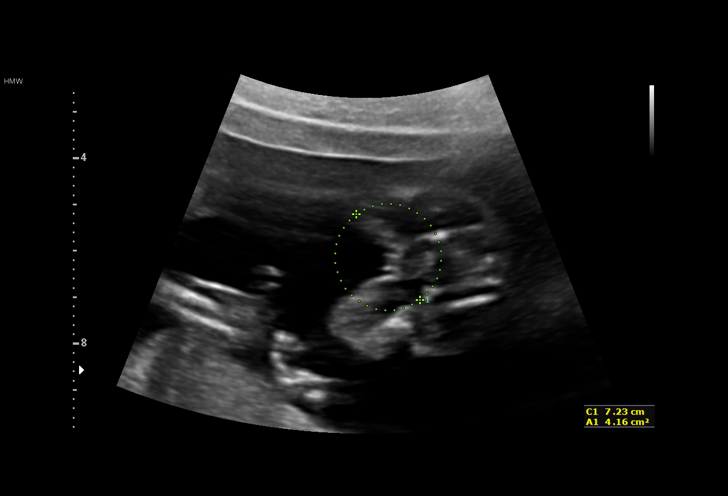
[im 62/73]
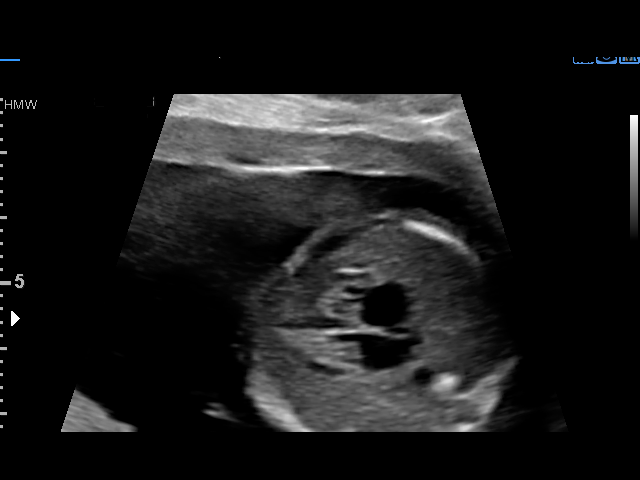
[im 67/73]
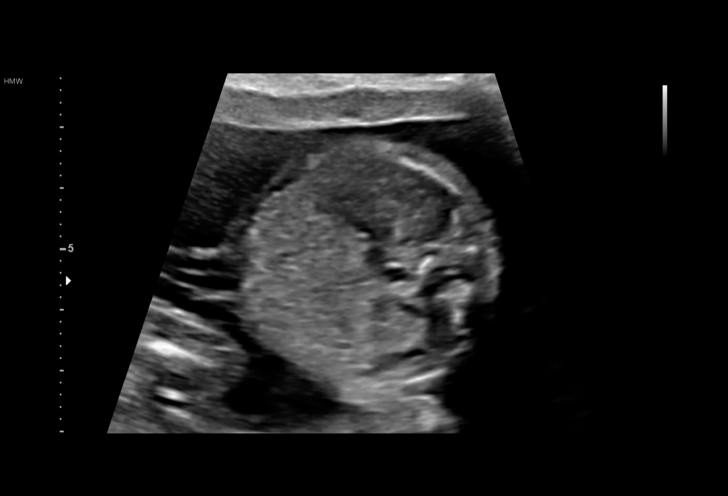
[im 73/73]
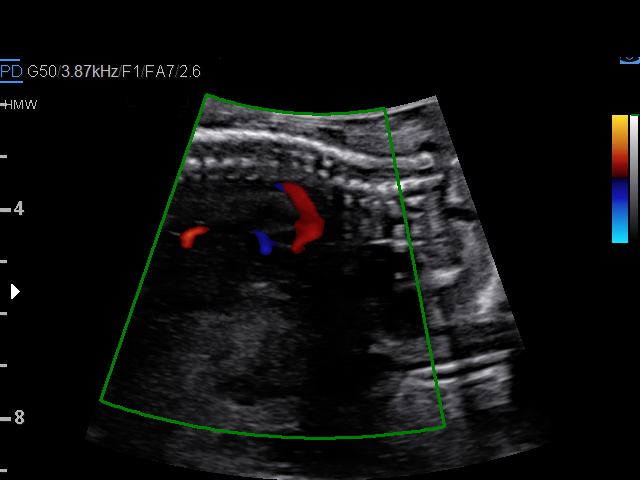

[14 of 28 positions shown; findings below may reference images not displayed]

1  ISMENIA PRENDERGAST             [id number]      [id number]     [id number]
Indications

19 weeks gestation of pregnancy
Obesity complicating pregnancy, second
trimester (pregravid BMI 36)
Advanced maternal age multigravida 35+,
second trimester; quad pending
Poor obstetric history: Previous
preeclampsia / eclampsia/gestational HTN
Encounter for antenatal screening for
malformations
Short interval between pregancies, 2nd
trimester
Poor obstetric history: Previous fetal growth
restriction (FGR)
OB History

Gravidity:    9         Term:   7        Prem:   0        SAB:   1
TOP:          0       Ectopic:  0        Living: 7
Fetal Evaluation

Num Of Fetuses:     1
Cardiac Activity:   Observed
Presentation:       Transverse, head to maternal left
Placenta:           Posterior, above cervical os
P. Cord Insertion:  Visualized, central

Amniotic Fluid
AFI FV:      Subjectively within normal limits

Largest Pocket(cm)
5.5
Biometry

BPD:      43.6  mm     G. Age:  19w 1d         46  %    CI:        71.42   %   70 - 86
FL/HC:      20.0   %   16.1 -
HC:      164.3  mm     G. Age:  19w 1d         37  %    HC/AC:      1.15       1.09 -
AC:       143   mm     G. Age:  19w 4d         57  %    FL/BPD:     75.2   %
FL:       32.8  mm     G. Age:  20w 2d         76  %    FL/AC:      22.9   %   20 - 24
HUM:        30  mm     G. Age:  19w 6d         66  %

Est. FW:     315  gm    0 lb 11 oz      54  %
Gestational Age

U/S Today:     19w 4d                                        EDD:   09/29/17
Best:          19w 2d    Det. By:   Early Ultrasound         EDD:   10/01/17
(02/22/17)
Anatomy

Cranium:               Appears normal         Aortic Arch:            Appears normal
Cavum:                 Appears normal         Ductal Arch:            Not well visualized
Ventricles:            Appears normal         Diaphragm:              Appears normal
Choroid Plexus:        Appears normal         Stomach:                Appears normal, left
sided
Cerebellum:            Appears normal         Abdomen:                Appears normal
Posterior Fossa:       Appears normal         Abdominal Wall:         Appears nml (cord
insert, abd wall)
Nuchal Fold:           Appears normal         Cord Vessels:           Appears normal (3
vessel cord)
Face:                  Appears normal         Kidneys:                Appear normal
(orbits and profile)
Lips:                  Appears normal         Bladder:                Appears normal
Thoracic:              Appears normal         Spine:                  Appears normal
Heart:                 Appears normal         Upper Extremities:      Appears normal
(4CH, axis, and
situs)
RVOT:                  Appears normal         Lower Extremities:      Appears normal
LVOT:                  Appears normal

Other:  Nasal bone visualized. Heels visualized. Fetus appears to be a
female. Technically difficult due to fetal position.
Cervix Uterus Adnexa

Cervix
Length:              4  cm.
Normal appearance by transabdominal scan.

Uterus
No abnormality visualized.

Left Ovary
No adnexal mass visualized.

Right Ovary
No adnexal mass visualized.

Cul De Sac:   No free fluid seen.

Adnexa:       No abnormality visualized.
Impression

Singleton intrauterine pregnancy at 19 weeks 2 days
gestation with fetal cardiac activity
Normal detailed fetal anatomy; limited views of the DA
Markers of aneuploidy: none
Normal amniotic fluid volume
Measurements consistent with early US
Recommendations

Follow-up ultrasound for serial growths in the third trimester
(AMA/previous SGA fetus)

## 2019-01-30 ENCOUNTER — Other Ambulatory Visit: Payer: Self-pay

## 2019-01-30 ENCOUNTER — Ambulatory Visit (INDEPENDENT_AMBULATORY_CARE_PROVIDER_SITE_OTHER): Payer: Medicaid Other | Admitting: General Practice

## 2019-01-30 DIAGNOSIS — N3001 Acute cystitis with hematuria: Secondary | ICD-10-CM | POA: Diagnosis not present

## 2019-01-30 DIAGNOSIS — R102 Pelvic and perineal pain unspecified side: Secondary | ICD-10-CM

## 2019-01-30 DIAGNOSIS — R300 Dysuria: Secondary | ICD-10-CM

## 2019-01-30 DIAGNOSIS — R350 Frequency of micturition: Secondary | ICD-10-CM

## 2019-01-30 LAB — POCT URINALYSIS DIP (DEVICE)
Bilirubin Urine: NEGATIVE
Glucose, UA: NEGATIVE mg/dL
Ketones, ur: NEGATIVE mg/dL
Nitrite: NEGATIVE
Protein, ur: 100 mg/dL — AB
Specific Gravity, Urine: 1.02 (ref 1.005–1.030)
Urobilinogen, UA: 0.2 mg/dL (ref 0.0–1.0)
pH: 7 (ref 5.0–8.0)

## 2019-01-30 MED ORDER — PHENAZOPYRIDINE HCL 200 MG PO TABS: 200.0000 mg | ORAL_TABLET | Freq: Three times a day (TID) | ORAL | 0 refills | Status: DC | PRN

## 2019-01-30 MED ORDER — NITROFURANTOIN MONOHYD MACRO 100 MG PO CAPS
100.0000 mg | ORAL_CAPSULE | Freq: Two times a day (BID) | ORAL | 0 refills | Status: AC
Start: 2019-01-30 — End: 2019-02-04

## 2019-01-30 NOTE — Progress Notes (Signed)
Patient presents to office today reporting bladder spasms/pelvic pain, hematuria, dysuria, & urinary frequency since Monday that has progressively got worse. Patient is doubled over in pain. UA reveals likely UTI. Discussed with patient sending macrobid, pyridium, & urine culture ordered. Told patient we may need to change her antibiotic based off the urine culture, if so we will call/send message and let you know. Discussed culture takes a few days to come back. Patient states she is breastfeeding her 51 month old but can stop temporarily if need be. Discussed with Dr Rosana Hoes who states macrobid is fine but if patient takes pyridium she should avoid breastfeeding for 48 hours after or "pump and dump". Informed patient. Discussed if symptoms get worse and she doesn't find relief from antibiotics or pyridium she should go to the ER. Patient verbalized understanding & had no questions.  Koren Bound RN BSN 01/30/19

## 2019-01-31 NOTE — Progress Notes (Signed)
I have reviewed this chart and agree with the RN/CMA assessment and management.    K. Meryl Davis, M.D. Attending [facility] (Faculty Practice)   

## 2019-02-01 ENCOUNTER — Telehealth: Payer: Self-pay

## 2019-02-01 DIAGNOSIS — N3001 Acute cystitis with hematuria: Secondary | ICD-10-CM

## 2019-02-01 LAB — URINE CULTURE

## 2019-02-01 MED ORDER — PHENAZOPYRIDINE HCL 200 MG PO TABS: 200.0000 mg | ORAL_TABLET | Freq: Three times a day (TID) | ORAL | 0 refills | Status: DC | PRN

## 2019-02-06 ENCOUNTER — Telehealth (INDEPENDENT_AMBULATORY_CARE_PROVIDER_SITE_OTHER): Payer: Medicaid Other | Admitting: Obstetrics & Gynecology

## 2019-02-06 DIAGNOSIS — N3000 Acute cystitis without hematuria: Secondary | ICD-10-CM

## 2019-02-06 NOTE — Telephone Encounter (Signed)
The patient called in requesting a call back. She stated she would like to know what medication is being prescribed for her UTI. She stated she no one has gotten back to her about sending in a prescription.

## 2019-02-07 MED ORDER — NITROFURANTOIN MONOHYD MACRO 100 MG PO CAPS: 100.0000 mg | ORAL_CAPSULE | Freq: Two times a day (BID) | ORAL | 0 refills | Status: DC

## 2019-02-07 MED ORDER — PHENAZOPYRIDINE HCL 100 MG PO TABS: 100.0000 mg | ORAL_TABLET | Freq: Two times a day (BID) | ORAL | 0 refills | Status: DC

## 2019-02-07 NOTE — Addendum Note (Signed)
Addended by: Donn Pierini on: 02/07/2019 03:00 PM   Modules accepted: Orders

## 2019-02-07 NOTE — Telephone Encounter (Signed)
Pt wants to know if she needs to have another antibiotic to treat her UTI. She reports she took the [facility] for 5 days as prescribed. She reports her symptoms went away and now she has a tingling post urination. She reports no other symptoms at this time.   Spoke with Dr. Rip Harbour and Macrobid and Pyridium ordered. Pt was informed to make sure she completes all medication. Pt voiced understanding.

## 2019-03-15 IMAGING — US US FETAL BPP W/ NON-STRESS
1 series · 10 of 10 positions shown · non-contrast
Comparison: none

[Series 1: us fetal bpp w/nonstress · 10 acquisitions, 10 frames shown]
[im 1/10]
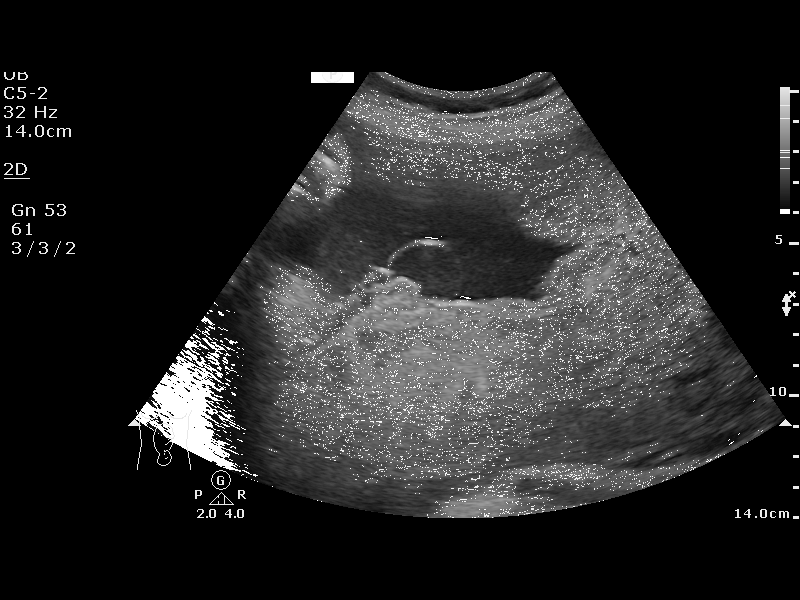
[im 2/10]
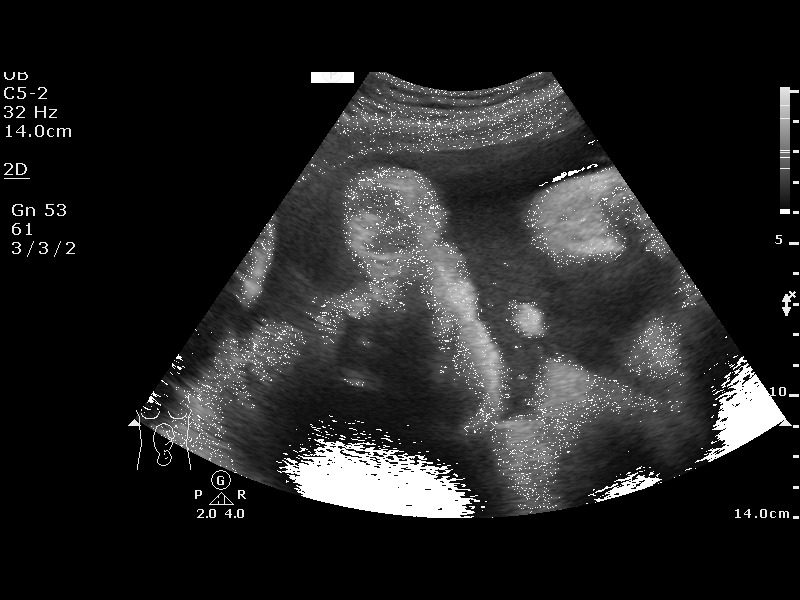
[im 3/10]
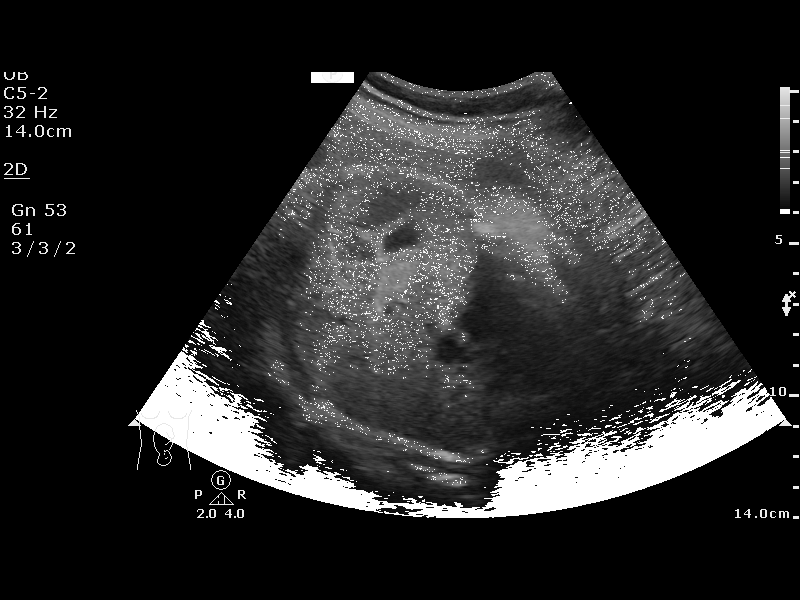
[im 4/10]
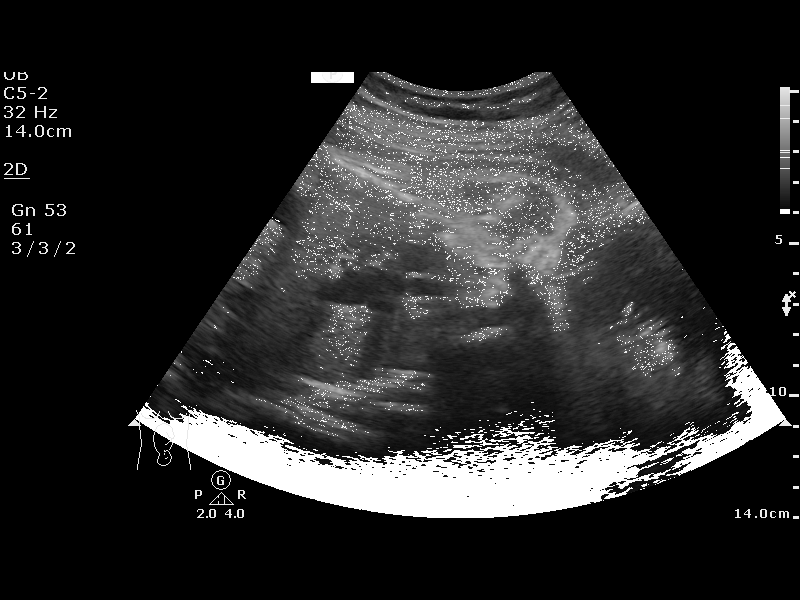
[im 5/10]
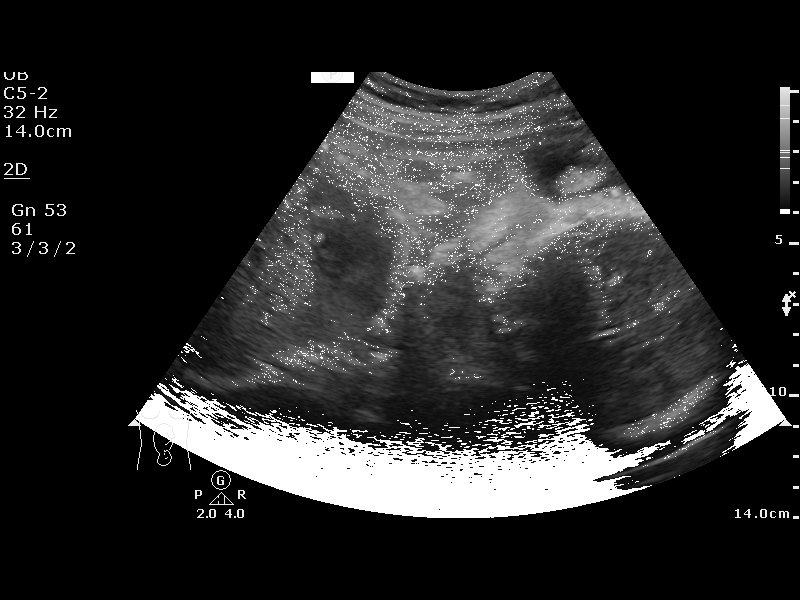
[im 6/10]
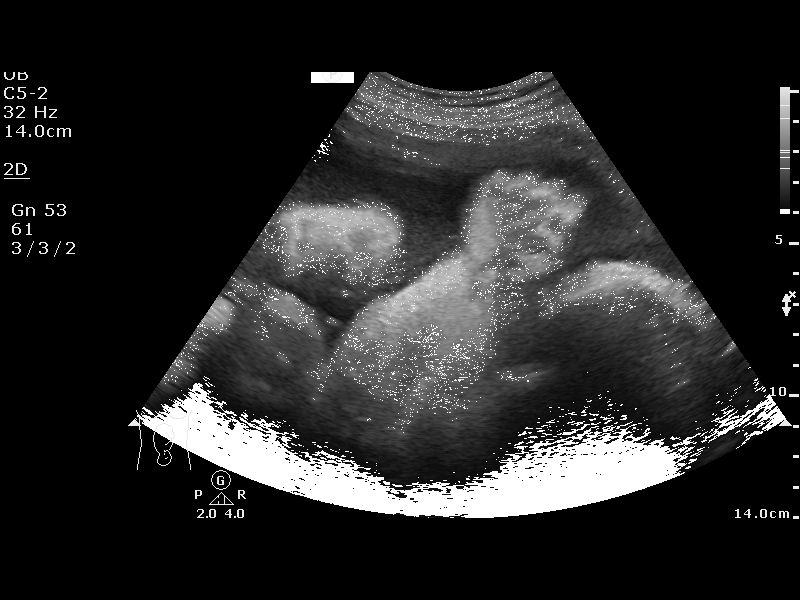
[im 7/10]
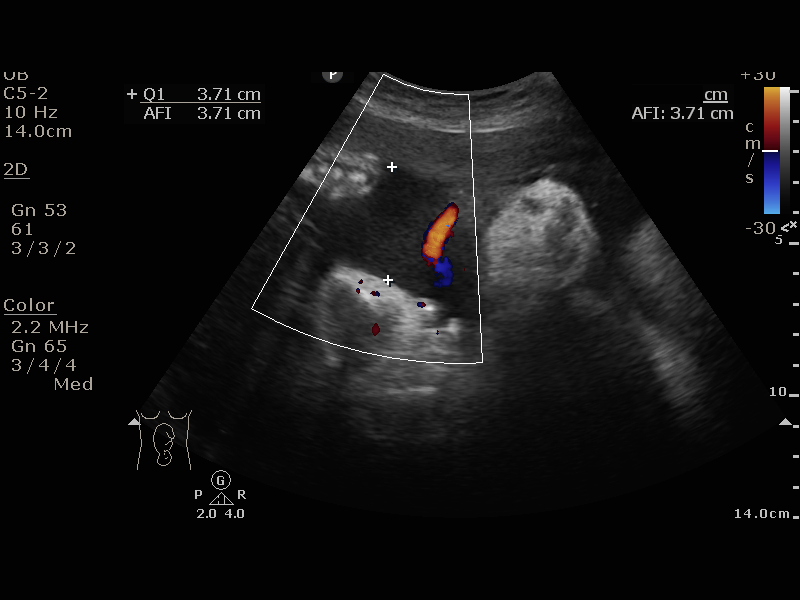
[im 8/10]
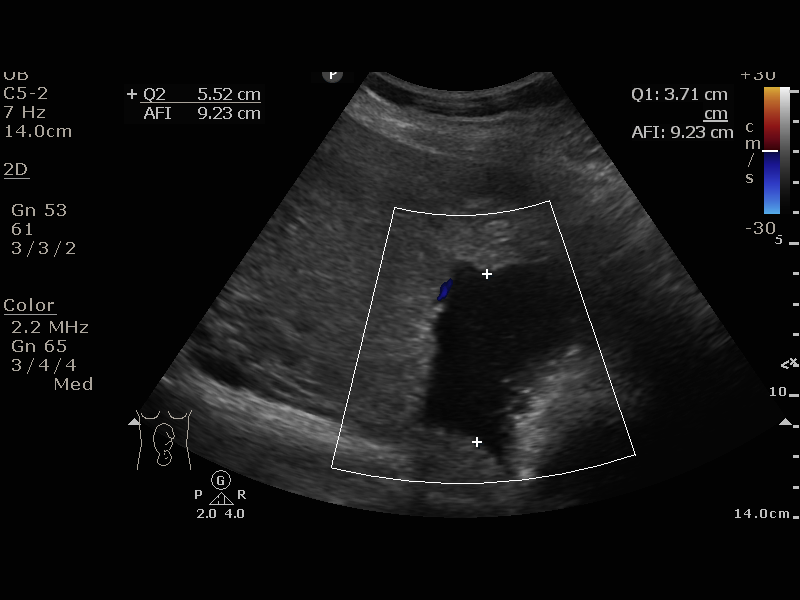
[im 9/10]
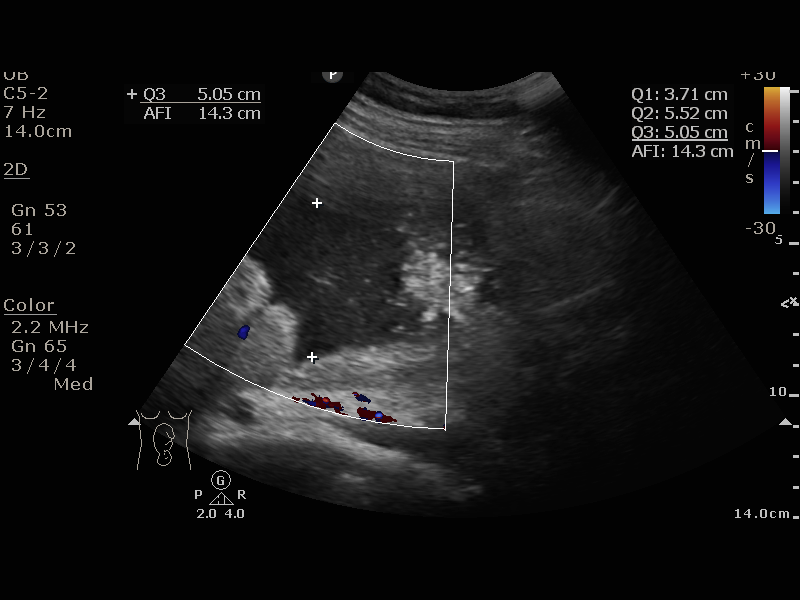
[im 10/10]
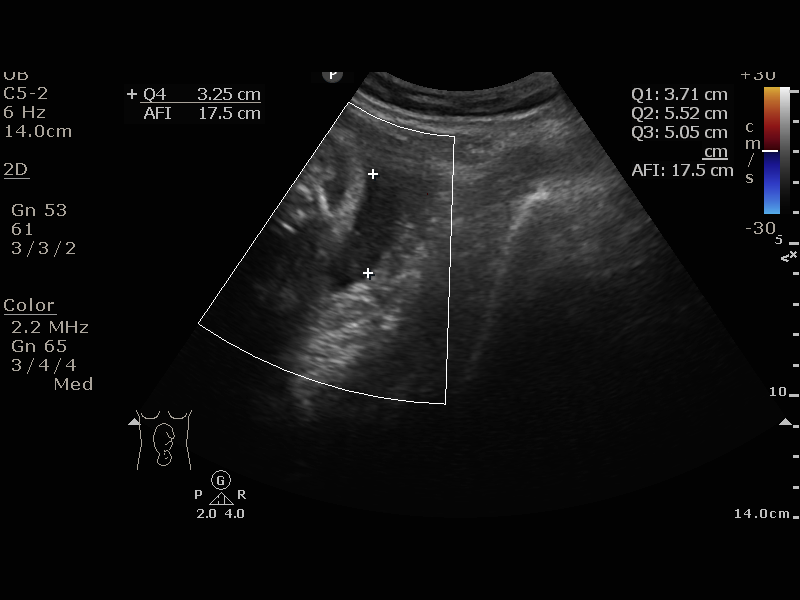

[10 of 10 positions shown; findings below may reference images not displayed]

Women's
[REDACTED]

1  US FETAL BPP W/NONSTRESS                    76818.4

1  CHIT SENG RANDY              [id number]      [id number]     [id number]
Indications

33 weeks gestation of pregnancy
Polyhydramnios, third trimester, antepartum
condition or complication, unspecified fetus
OB History

Gravidity:    9         Term:   7        Prem:   0        SAB:   1
TOP:          0       Ectopic:  0        Living: 7
Fetal Evaluation

Num Of Fetuses:     1
Preg. Location:     Intrauterine
Cardiac Activity:   Observed
Presentation:       Cephalic

Amniotic Fluid
AFI FV:      Subjectively within normal limits

AFI Sum(cm)     %Tile       Largest Pocket(cm)
17.53           64

RUQ(cm)       RLQ(cm)       LUQ(cm)        LLQ(cm)
3.71
Biophysical Evaluation

Amniotic F.V:   Pocket => 2 cm two         F. Tone:        Observed
planes
F. Movement:    Observed                   N.S.T:          Reactive
F. Breathing:   Not Observed               Score:          [DATE]
Gestational Age

Best:          33w 5d     Det. By:  Early Ultrasound         EDD:   10/01/17
(02/22/17)
Impression

Recommendations

Cont weekly testing

## 2019-04-01 NOTE — Telephone Encounter (Signed)
Opened to order pt Pyridium painful urination.

## 2019-10-01 ENCOUNTER — Other Ambulatory Visit: Payer: Self-pay

## 2019-10-01 ENCOUNTER — Ambulatory Visit (HOSPITAL_COMMUNITY)
Admission: EM | Admit: 2019-10-01 | Discharge: 2019-10-01 | Disposition: A | Discharge status: Home / Self Care | Payer: Medicaid Other

## 2019-10-01 NOTE — ED Triage Notes (Signed)
Wells Guiles, patient access, reports patient chose to leave because she did not want to wait

## 2019-12-16 ENCOUNTER — Ambulatory Visit: Payer: Medicaid Other | Admitting: Certified Nurse Midwife

## 2020-04-08 ENCOUNTER — Telehealth: Payer: Self-pay | Admitting: Lactation Services

## 2020-04-08 DIAGNOSIS — T8332XS Displacement of intrauterine contraceptive device, sequela: Secondary | ICD-10-CM

## 2020-04-08 NOTE — Telephone Encounter (Signed)
Patient reports she has sexual intercourse about 3-4 weeks ago and she had severe pain that lasted 1-1.5 hours. The pain subsided and she has had spotting since.   She reports this is similar when another IUD was misplaced and she wants to be evaluated. She cannot feel her strings but reports she never was able to.   Patient reports she does not want to get pregnant. Reviewed with patient that if she is concerned about pregnancy to use a back up birth control.   Patient reports she had Liposuction in May and was concerned that is related.   Patient advised we will scheduled [place] and then follow up. [place] scheduled for Complete US with transvaginal on 9/3 at 3 pm. Check in at 2:45 with full bladder. Patient informed of appointment date, time and location. Patient voiced understanding.

## 2020-04-08 NOTE — Telephone Encounter (Signed)
Patient was assessed and managed by nursing staff during this encounter. I have reviewed the chart and agree with the documentation and plan. I have also made any necessary editorial changes.  Verita Schneiders, MD 04/08/2020 9:39 AM

## 2020-04-10 ENCOUNTER — Ambulatory Visit: Payer: Medicaid Other | Attending: Obstetrics & Gynecology

## 2020-05-07 ENCOUNTER — Other Ambulatory Visit (HOSPITAL_COMMUNITY)
Admission: RE | Admit: 2020-05-07 | Discharge: 2020-05-07 | Disposition: A | Discharge status: Home / Self Care | Payer: 59 | Source: Ambulatory Visit | Attending: Family Medicine | Admitting: Family Medicine

## 2020-05-07 ENCOUNTER — Ambulatory Visit (INDEPENDENT_AMBULATORY_CARE_PROVIDER_SITE_OTHER): Payer: Medicaid Other

## 2020-05-07 VITALS — BP 131/75 | HR 78 | Wt 185.4 lb

## 2020-05-07 DIAGNOSIS — Z113 Encounter for screening for infections with a predominantly sexual mode of transmission: Secondary | ICD-10-CM

## 2020-05-07 NOTE — Progress Notes (Signed)
Patient was assessed and managed by nursing staff during this encounter. I have reviewed the chart and agree with the documentation and plan. I have also made any necessary editorial changes.  Griffin Basil, MD 05/07/2020 12:42 PM

## 2020-05-07 NOTE — Progress Notes (Addendum)
Pt here today for STD testing. Reports life insurance company coordinated STD blood testing that was completed yesterday. Would only like vaginal swab today. Reports fishy vaginal odor with no abnormal discharge. Explained this sounds suspicious for BV; pt would not like any treatment until infection confirmed by result of vaginal swab. Self-swab instructions given and specimen obtained. Explained pt will be called with any abnormal results. Assisted pt with downloading MyChart so that she can review results there.   Pt requests IUD strings be checked today; explained she will need a provider visit for this. Pt denies any periods of heavy bleeding or pain that caused her to be concerned about IUD out of place. Explained how to feel for IUD strings at home. If unable to feel, pt to schedule appt with provider.   Apolonio Schneiders RN 05/07/20

## 2020-05-08 LAB — CERVICOVAGINAL ANCILLARY ONLY
Bacterial Vaginitis (gardnerella): NEGATIVE
Candida Glabrata: NEGATIVE
Candida Vaginitis: NEGATIVE
Chlamydia: NEGATIVE
Comment: NEGATIVE
Comment: NEGATIVE
Comment: NEGATIVE
Comment: NEGATIVE
Comment: NEGATIVE
Comment: NORMAL
Neisseria Gonorrhea: NEGATIVE
Trichomonas: NEGATIVE

## 2023-03-15 ENCOUNTER — Ambulatory Visit (INDEPENDENT_AMBULATORY_CARE_PROVIDER_SITE_OTHER): Payer: Medicaid Other | Admitting: Obstetrics and Gynecology

## 2023-03-15 ENCOUNTER — Other Ambulatory Visit (HOSPITAL_COMMUNITY)
Admission: RE | Admit: 2023-03-15 | Discharge: 2023-03-15 | Disposition: A | Discharge status: Home / Self Care | Payer: Medicaid Other | Source: Ambulatory Visit | Attending: Obstetrics and Gynecology | Admitting: Obstetrics and Gynecology

## 2023-03-15 ENCOUNTER — Encounter: Payer: Self-pay | Admitting: Obstetrics and Gynecology

## 2023-03-15 VITALS — BP 140/92 | HR 93 | Ht 64.0 in | Wt 200.4 lb

## 2023-03-15 DIAGNOSIS — Z1239 Encounter for other screening for malignant neoplasm of breast: Secondary | ICD-10-CM

## 2023-03-15 DIAGNOSIS — Z124 Encounter for screening for malignant neoplasm of cervix: Secondary | ICD-10-CM | POA: Insufficient documentation

## 2023-03-15 DIAGNOSIS — R300 Dysuria: Secondary | ICD-10-CM

## 2023-03-15 DIAGNOSIS — R310 Gross hematuria: Secondary | ICD-10-CM

## 2023-03-15 DIAGNOSIS — Z113 Encounter for screening for infections with a predominantly sexual mode of transmission: Secondary | ICD-10-CM | POA: Insufficient documentation

## 2023-03-15 DIAGNOSIS — Z01419 Encounter for gynecological examination (general) (routine) without abnormal findings: Secondary | ICD-10-CM

## 2023-03-15 LAB — POCT URINALYSIS DIP (DEVICE)
Bilirubin Urine: NEGATIVE
Glucose, UA: NEGATIVE mg/dL
Ketones, ur: NEGATIVE mg/dL
Nitrite: NEGATIVE
Protein, ur: NEGATIVE mg/dL
Specific Gravity, Urine: 1.025 (ref 1.005–1.030)
Urobilinogen, UA: 0.2 mg/dL (ref 0.0–1.0)
pH: 6 (ref 5.0–8.0)

## 2023-03-15 LAB — POCT PREGNANCY, URINE: Preg Test, Ur: NEGATIVE

## 2023-03-15 MED ORDER — CEPHALEXIN 500 MG PO CAPS: 500.0000 mg | ORAL_CAPSULE | Freq: Four times a day (QID) | ORAL | 0 refills | Status: DC

## 2023-03-15 NOTE — Progress Notes (Signed)
1 day hx dysuria, no fever chill. No back pain     GYNECOLOGY ANNUAL PREVENTATIVE CARE ENCOUNTER NOTE  History:     Christy [name] is a 42 y.o. [phone] female here for a routine annual gynecologic exam.  Current complaints: 1 day history dysuria with no fever or chills.  Pt notes some mild hematuria as well.   Denies abnormal vaginal bleeding, discharge, pelvic pain, problems with intercourse or other gynecologic concerns. Pt has a current mirena IUD that was placed in 2019.  Pt advised that mirena will last 8 years for contraception.   Gynecologic History No LMP recorded. (Menstrual status: IUD). Contraception: IUD Last Pap: 10/14/15. Results were: normal with negative HPV Last mammogram:n/a  Obstetric History OB History  Gravida Para Term Preterm AB Living  9 8 7 1 1 8   SAB IAB Ectopic Multiple Live Births  1 0 0 0 8    # Outcome Date GA Lbr Len/2nd Weight Sex Type Anes PTL Lv  9 Term 09/23/17 [redacted]w[redacted]d 35:02 / 00:10 6 lb 11.1 oz (3.035 kg) F Vag-Spont EPI  LIV  8 Preterm 03/30/16 [redacted]w[redacted]d 05:45 / 00:17 4 lb 8.8 oz (2.065 kg) M Vag-Spont EPI  LIV     Birth Comments: extra digits bilateral hands     Complications: Severe preeclampsia  7 Term 09/17/10 [redacted]w[redacted]d  6 lb 4 oz (2.835 kg) M Vag-Spont EPI N LIV  6 Term 08/17/07 [redacted]w[redacted]d  7 lb (3.175 kg) F Vag-Spont  N LIV  5 Term 11/10/04 [redacted]w[redacted]d  7 lb 8 oz (3.402 kg) M Vag-Spont None N LIV  4 Term 07/23/01 [redacted]w[redacted]d  7 lb 6 oz (3.345 kg) M Vag-Spont None N LIV  3 Term 05/21/99 [redacted]w[redacted]d  7 lb 4 oz (3.289 kg) M Vag-Spont None N LIV  2 Term 05/30/97 [redacted]w[redacted]d  6 lb 8 oz (2.948 kg) F Vag-Spont EPI N LIV  1 SAB             Obstetric Comments  03/30/2016 Induced for severe preeclampsia    Past Medical History:  Diagnosis Date   Anemia    Preeclampsia, severe 03/29/2016    Past Surgical History:  Procedure Laterality Date   NO PAST SURGERIES      Current Outpatient Medications on File Prior to Visit  Medication Sig Dispense Refill   Ascorbic Acid (VITAMIN  C PO) Take by mouth.     levonorgestrel (MIRENA, 52 MG,) 20 MCG/24HR IUD Mirena 20 mcg/24 hours (6 yrs) 52 mg intrauterine device  Take by intrauterine route.     VITAMIN D PO Take by mouth.     No current facility-administered medications on file prior to visit.    Allergies  Allergen Reactions   Diflucan [Fluconazole]     Mouth and vagina area breaks out into hives   Sulfamethoxazole-Trimethoprim Hives   Latex Rash   Metronidazole Rash    If takes longer than 5 days will develop a rash    Social History:  reports that she has never smoked. She has never used smokeless tobacco. She reports that she does not drink alcohol and does not use drugs.  Family History  Problem Relation Age of Onset   Hypertension Mother    Hypertension Father    Heart attack Father    Stroke Father    Hypertension Sister    Hypertension Maternal Grandmother     The following portions of the patient's history were reviewed and updated as appropriate: allergies, current medications, past family history,  past medical history, past social history, past surgical history and problem list.  Review of Systems Pertinent items noted in HPI and remainder of comprehensive ROS otherwise negative.  Physical Exam:  BP (!) 140/92   Pulse 93   Ht 5\' 4"  (1.626 m)   Wt 200 lb 6.4 oz (90.9 kg)   BMI 34.40 kg/m  CONSTITUTIONAL: Well-developed, well-nourished female in no acute distress.  HENT:  Normocephalic, atraumatic, External right and left ear normal. Oropharynx is clear and moist EYES: Conjunctivae and EOM are normal. NECK: Normal range of motion, supple, no masses.  Normal thyroid.  SKIN: Skin is warm and dry. No rash noted. Not diaphoretic. No erythema. No pallor. MUSCULOSKELETAL: Normal range of motion. No tenderness.  No cyanosis, clubbing, or edema.  2+ distal pulses. NEUROLOGIC: Alert and oriented to person, place, and time. Normal reflexes, muscle tone coordination.  PSYCHIATRIC: Normal mood and  affect. Normal behavior. Normal judgment and thought content. CARDIOVASCULAR: Normal heart rate noted, regular rhythm RESPIRATORY: Clear to auscultation bilaterally. Effort and breath sounds normal, no problems with respiration noted. BREASTS: deferred ABDOMEN: Soft, no distention noted.  No tenderness, rebound or guarding.  PELVIC: Normal appearing external genitalia and urethral meatus; normal appearing vaginal mucosa and cervix.  No abnormal discharge noted.  Pap smear obtained.  Normal uterine size, no other palpable masses, no uterine or adnexal tenderness. No CMT. Performed in the presence of a chaperone.   Assessment and Plan:    1. Breast screening Mammogram scheduled - MM 3D SCREENING MAMMOGRAM BILATERAL BREAST; Future  2. Cervical cancer screening  - Cytology - PAP( [place])  3. Screen for STD (sexually transmitted disease) Per pt request - Cervicovaginal ancillary only( [place])  4. Women's annual routine gynecological examination Normal annual exam Will monitor BP, if elevation is persistent will refer to internal medicine for maintainence  5. Dysuria Will empirically treat UTI sx  - Urine Culture - cephALEXin (KEFLEX) 500 MG capsule; Take 1 capsule (500 mg total) by mouth 4 (four) times daily.  Dispense: 28 capsule; Refill: 0  6. Gross hematuria  - Urine Culture - cephALEXin (KEFLEX) 500 MG capsule; Take 1 capsule (500 mg total) by mouth 4 (four) times daily.  Dispense: 28 capsule; Refill: 0  Will follow up results of pap smear and manage accordingly. Mammogram scheduled Routine preventative health maintenance measures emphasized. Please refer to After Visit Summary for other counseling recommendations.     F/u in 1 year Mariel Aloe, MD, FACOG Obstetrician & Gynecologist, [facility] for [employer], [facility]

## 2023-11-18 ENCOUNTER — Ambulatory Visit
Admission: EM | Admit: 2023-11-18 | Discharge: 2023-11-18 | Disposition: A | Discharge status: Home / Self Care | Attending: Family Medicine | Admitting: Family Medicine

## 2023-11-18 DIAGNOSIS — B351 Tinea unguium: Secondary | ICD-10-CM | POA: Diagnosis not present

## 2023-11-18 MED ORDER — TERBINAFINE HCL 250 MG PO TABS: 250.0000 mg | ORAL_TABLET | Freq: Every day | ORAL | 0 refills | Status: DC

## 2023-11-18 NOTE — ED Triage Notes (Signed)
 Pt c/o issues with right great toe nail x 1 year after a pedicure-states she has not sought medical tx prior to today-NAD-steady gait

## 2023-11-18 NOTE — ED Provider Notes (Signed)
 [facility]  Note:  This document was prepared using Conservation officer, historic buildings and may include unintentional dictation errors.  MRN: [id number] DOB: 1980/09/03  Subjective:   Christy [name] is a 43 y.o. female presenting for 1 year history of persistent right great toenail fungal infection.  Patient has tried multiple over-the-counter remedies without any kind of relief.  Symptoms started after she got a pedicure.  Has not sought medical treatment prior to today's visit.  Patient does have significant allergies to fluconazole.  Chart review does not indicate she has taken any other kind of antifungal medication in the same class.  No current facility-administered medications for this encounter.  Current Outpatient Medications:    Ascorbic Acid (VITAMIN C PO), Take by mouth., Disp: , Rfl:    cephALEXin (KEFLEX) 500 MG capsule, Take 1 capsule (500 mg total) by mouth 4 (four) times daily., Disp: 28 capsule, Rfl: 0   levonorgestrel (MIRENA, 52 MG,) 20 MCG/24HR IUD, Mirena 20 mcg/24 hours (6 yrs) 52 mg intrauterine device  Take by intrauterine route., Disp: , Rfl:    VITAMIN D PO, Take by mouth., Disp: , Rfl:    Allergies  Allergen Reactions   Diflucan [Fluconazole]     Mouth and vagina area breaks out into hives   Sulfamethoxazole-Trimethoprim Hives   Latex Rash   Metronidazole Rash    If takes longer than 5 days will develop a rash    Past Medical History:  Diagnosis Date   Anemia    Preeclampsia, severe 03/29/2016     Past Surgical History:  Procedure Laterality Date   NO PAST SURGERIES      Family History  Problem Relation Age of Onset   Hypertension Mother    Hypertension Father    Heart attack Father    Stroke Father    Hypertension Sister    Hypertension Maternal Grandmother     Social History   Tobacco Use   Smoking status: Never   Smokeless tobacco: Never  Vaping Use   Vaping status: Never Used  Substance Use Topics    Alcohol use: No   Drug use: No    ROS   Objective:   Vitals: BP (!) 147/92 (BP Location: Right Arm)   Pulse 72   Temp 98.5 F (36.9 C) (Oral)   Resp 20   LMP 11/09/2023   SpO2 98%   Physical Exam Constitutional:      General: She is not in acute distress.    Appearance: Normal appearance. She is well-developed. She is not ill-appearing, toxic-appearing or diaphoretic.  HENT:     Head: Normocephalic and atraumatic.     Nose: Nose normal.     Mouth/Throat:     Mouth: Mucous membranes are moist.  Eyes:     General: No scleral icterus.       Right eye: No discharge.        Left eye: No discharge.     Extraocular Movements: Extraocular movements intact.  Cardiovascular:     Rate and Rhythm: Normal rate.  Pulmonary:     Effort: Pulmonary effort is normal.  Skin:    General: Skin is warm and dry.       Neurological:     General: No focal deficit present.     Mental Status: She is alert and oriented to person, place, and time.  Psychiatric:        Mood and Affect: Mood normal.  Behavior: Behavior normal.    Assessment and Plan :   PDMP not reviewed this encounter.  1. Onychomycosis    Recommended terbinafine daily for management of onychomycosis.  Advised checking LFTs throughout the process.  Counseled patient on potential for adverse effects with medications prescribed/recommended today, ER and return-to-clinic precautions discussed, patient verbalized understanding.    Adolph Hoop, PA-C 11/18/23 1001

## 2023-12-27 ENCOUNTER — Telehealth: Payer: Self-pay | Admitting: Family Medicine

## 2023-12-27 NOTE — Telephone Encounter (Signed)
 Patient needs a call back in regards to a UTI she says that our next available is too far out.

## 2023-12-28 ENCOUNTER — Ambulatory Visit

## 2023-12-28 NOTE — Telephone Encounter (Signed)
 Returned call to patient. Able to schedule her this afternoon due to cancellation. Patient informed she needs to be able to void once she gets here. She voiced understanding. Aaron Aas

## 2024-03-05 ENCOUNTER — Emergency Department (HOSPITAL_BASED_OUTPATIENT_CLINIC_OR_DEPARTMENT_OTHER)

## 2024-03-05 ENCOUNTER — Other Ambulatory Visit: Payer: Self-pay

## 2024-03-05 ENCOUNTER — Encounter (HOSPITAL_BASED_OUTPATIENT_CLINIC_OR_DEPARTMENT_OTHER): Payer: Self-pay

## 2024-03-05 ENCOUNTER — Emergency Department (HOSPITAL_BASED_OUTPATIENT_CLINIC_OR_DEPARTMENT_OTHER)
Admission: EM | Admit: 2024-03-05 | Discharge: 2024-03-05 | Disposition: A | Discharge status: Home / Self Care | Source: Ambulatory Visit

## 2024-03-05 DIAGNOSIS — R0602 Shortness of breath: Secondary | ICD-10-CM | POA: Insufficient documentation

## 2024-03-05 DIAGNOSIS — Z91040 Latex allergy status: Secondary | ICD-10-CM | POA: Diagnosis not present

## 2024-03-05 DIAGNOSIS — R079 Chest pain, unspecified: Secondary | ICD-10-CM | POA: Diagnosis not present

## 2024-03-05 LAB — CBC WITH DIFFERENTIAL/PLATELET
Abs Immature Granulocytes: 0 K/uL (ref 0.00–0.07)
Basophils Absolute: 0.1 K/uL (ref 0.0–0.1)
Basophils Relative: 2 %
Eosinophils Absolute: 0.1 K/uL (ref 0.0–0.5)
Eosinophils Relative: 3 %
HCT: 35.4 % — ABNORMAL LOW (ref 36.0–46.0)
Hemoglobin: 12 g/dL (ref 12.0–15.0)
Immature Granulocytes: 0 %
Lymphocytes Relative: 35 %
Lymphs Abs: 1.5 K/uL (ref 0.7–4.0)
MCH: 29.3 pg (ref 26.0–34.0)
MCHC: 33.9 g/dL (ref 30.0–36.0)
MCV: 86.3 fL (ref 80.0–100.0)
Monocytes Absolute: 0.4 K/uL (ref 0.1–1.0)
Monocytes Relative: 10 %
Neutro Abs: 2.1 K/uL (ref 1.7–7.7)
Neutrophils Relative %: 50 %
Platelets: 296 K/uL (ref 150–400)
RBC: 4.1 MIL/uL (ref 3.87–5.11)
RDW: 13 % (ref 11.5–15.5)
WBC: 4.2 K/uL (ref 4.0–10.5)
nRBC: 0 % (ref 0.0–0.2)

## 2024-03-05 LAB — TROPONIN T, HIGH SENSITIVITY: Troponin T High Sensitivity: <15 ng/L (ref <19)

## 2024-03-05 LAB — HCG, SERUM, QUALITATIVE: Preg, Serum: NEGATIVE

## 2024-03-05 LAB — COMPREHENSIVE METABOLIC PANEL WITH GFR
ALT: 21 U/L (ref 0–44)
AST: 26 U/L (ref 15–41)
Albumin: 4.3 g/dL (ref 3.5–5.0)
Alkaline Phosphatase: 83 U/L (ref 38–126)
Anion gap: 10 (ref 5–15)
BUN: 6 mg/dL (ref 6–20)
CO2: 23 mmol/L (ref 22–32)
Calcium: 9.2 mg/dL (ref 8.9–10.3)
Chloride: 106 mmol/L (ref 98–111)
Creatinine, Ser: 0.87 mg/dL (ref 0.44–1.00)
GFR, Estimated: >60 mL/min (ref >60)
Glucose, Bld: 88 mg/dL (ref 70–99)
Potassium: 3.5 mmol/L (ref 3.5–5.1)
Sodium: 139 mmol/L (ref 135–145)
Total Bilirubin: 0.3 mg/dL (ref 0.0–1.2)
Total Protein: 7.5 g/dL (ref 6.5–8.1)

## 2024-03-05 LAB — D-DIMER, QUANTITATIVE: D-Dimer, Quant: <0.27 ug{FEU}/mL (ref 0.00–0.50)

## 2024-03-05 NOTE — ED Triage Notes (Signed)
 Pt reports on vacation last week and reports elevated up to as high as 170/100 during the trip. Drove 8 hrs to [place]  Recent SOB with exertion, grabbing feeling in chest, tightness right shoulder, numbness in right arm, fatigue. Tingling in right arm x 2-3 days.[name]

## 2024-03-05 NOTE — ED Notes (Signed)
 Pt ambulated around the nurse's station O2 was between 97-99% .

## 2024-03-05 NOTE — ED Provider Notes (Signed)
 [facility] Provider Note   CSN: [id number] Arrival date & time: 03/05/24  1357     Patient presents with: Shortness of Breath   Christy [name] is a 43 y.o. female.   Patient to ED for evaluation of DOE that started while on vacation last week. No cough or fever. She states they drove to [place]  for vacation. No pain or swelling in one or both legs. No history of blood clots. She has the Mirena  IUD in place. She states no pain associated with the DOE, but this morning, independent of feeling SOB she had 2 episodes of sharp central chest pain that last a few seconds before completely resolving. No pain now. No nausea, vomiting.    Shortness of Breath      Prior to Admission medications   Medication Sig Start Date End Date Taking? Authorizing Provider  Ascorbic Acid (VITAMIN C PO) Take by mouth.    [provider]  cephALEXin  (KEFLEX ) 500 MG capsule Take 1 capsule (500 mg total) by mouth 4 (four) times daily. 03/15/23   Zina Jerilynn LABOR, MD  levonorgestrel  (MIRENA , 52 MG,) 20 MCG/24HR IUD Mirena  20 mcg/24 hours (6 yrs) 52 mg intrauterine device  Take by intrauterine route.    [provider]  terbinafine  (LAMISIL ) 250 MG tablet Take 1 tablet (250 mg total) by mouth daily. 11/18/23   Christopher Savannah, PA-C  VITAMIN D PO Take by mouth.    [provider]    Allergies: Diflucan [fluconazole], Sulfamethoxazole-trimethoprim, Latex, and Metronidazole     Review of Systems  Respiratory:  Positive for shortness of breath.     Updated Vital Signs BP (!) 146/82   Pulse 77   Temp 98 F (36.7 C)   Resp 10   Wt 90.7 kg   LMP  (LMP Unknown)   SpO2 99%   BMI 34.33 kg/m   Physical Exam Constitutional:      Appearance: She is well-developed.  HENT:     Head: Normocephalic.  Cardiovascular:     Rate and Rhythm: Normal rate and regular rhythm.     Heart sounds: No murmur heard. Pulmonary:     Effort: Pulmonary  effort is normal.     Breath sounds: Normal breath sounds. No wheezing, rhonchi or rales.  Abdominal:     General: Bowel sounds are normal.     Palpations: Abdomen is soft.     Tenderness: There is no abdominal tenderness. There is no guarding or rebound.  Musculoskeletal:        General: Normal range of motion.     Cervical back: Normal range of motion and neck supple.     Right lower leg: No tenderness.     Left lower leg: No tenderness.  Skin:    General: Skin is warm and dry.  Neurological:     General: No focal deficit present.     Mental Status: She is alert and oriented to person, place, and time.     (all labs ordered are listed, but only abnormal results are displayed) Labs Reviewed  CBC WITH DIFFERENTIAL/PLATELET - Abnormal; Notable for the following components:      Result Value   HCT 35.4 (*)    All other components within normal limits  HCG, SERUM, QUALITATIVE  D-DIMER, QUANTITATIVE  COMPREHENSIVE METABOLIC PANEL WITH GFR  CBC WITH DIFFERENTIAL/PLATELET  CBC WITH DIFFERENTIAL/PLATELET  TROPONIN T, HIGH SENSITIVITY  TROPONIN T, HIGH SENSITIVITY   Results for orders placed or  performed during the hospital encounter of 03/05/24  Pregnancy serum, qualitative   Collection Time: 03/05/24  2:28 PM  Result Value Ref Range   Preg, Serum NEGATIVE NEGATIVE  D-dimer, quantitative   Collection Time: 03/05/24  3:05 PM  Result Value Ref Range   D-Dimer, Quant <0.27 0.00 - 0.50 ug/mL-FEU  CBC with Differential/Platelet   Collection Time: 03/05/24  3:15 PM  Result Value Ref Range   WBC 4.2 4.0 - 10.5 K/uL   RBC 4.10 3.87 - 5.11 MIL/uL   Hemoglobin 12.0 12.0 - 15.0 g/dL   HCT 64.5 (L) 63.9 - 53.9 %   MCV 86.3 80.0 - 100.0 fL   MCH 29.3 26.0 - 34.0 pg   MCHC 33.9 30.0 - 36.0 g/dL   RDW 86.9 88.4 - 84.4 %   Platelets 296 150 - 400 K/uL   nRBC 0.0 0.0 - 0.2 %   Neutrophils Relative % 50 %   Neutro Abs 2.1 1.7 - 7.7 K/uL   Lymphocytes Relative 35 %   Lymphs Abs 1.5  0.7 - 4.0 K/uL   Monocytes Relative 10 %   Monocytes Absolute 0.4 0.1 - 1.0 K/uL   Eosinophils Relative 3 %   Eosinophils Absolute 0.1 0.0 - 0.5 K/uL   Basophils Relative 2 %   Basophils Absolute 0.1 0.0 - 0.1 K/uL   Immature Granulocytes 0 %   Abs Immature Granulocytes 0.00 0.00 - 0.07 K/uL  Comprehensive metabolic panel with GFR   Collection Time: 03/05/24  3:15 PM  Result Value Ref Range   Sodium 139 135 - 145 mmol/L   Potassium 3.5 3.5 - 5.1 mmol/L   Chloride 106 98 - 111 mmol/L   CO2 23 22 - 32 mmol/L   Glucose, Bld 88 70 - 99 mg/dL   BUN 6 6 - 20 mg/dL   Creatinine, Ser 9.12 0.44 - 1.00 mg/dL   Calcium  9.2 8.9 - 10.3 mg/dL   Total Protein 7.5 6.5 - 8.1 g/dL   Albumin 4.3 3.5 - 5.0 g/dL   AST 26 15 - 41 U/L   ALT 21 0 - 44 U/L   Alkaline Phosphatase 83 38 - 126 U/L   Total Bilirubin 0.3 0.0 - 1.2 mg/dL   GFR, Estimated >39 >39 mL/min   Anion gap 10 5 - 15  Troponin T, High Sensitivity   Collection Time: 03/05/24  3:15 PM  Result Value Ref Range   Troponin T High Sensitivity <15 <19 ng/L    EKG: EKG Interpretation Date/Time:  Tuesday March 05 2024 14:05:04 EDT Ventricular Rate:  75 PR Interval:  181 QRS Duration:  91 QT Interval:  404 QTC Calculation: 452 R Axis:   4  Text Interpretation: Sinus rhythm Low voltage, precordial leads Borderline T abnormalities, anterior leads Confirmed by Simon Rea [phone]) on 03/05/2024 2:09:13 PM  Radiology: DG Chest Portable 1 View Result Date: 03/05/2024 CLINICAL DATA:  Chest pain EXAM: PORTABLE CHEST 1 VIEW COMPARISON:  None Available. FINDINGS: The heart size and mediastinal contours are within normal limits. Both lungs are clear. The visualized skeletal structures are unremarkable. IMPRESSION: No acute cardiopulmonary process. Electronically Signed   By: Jackquline Boxer M.D.   On: 03/05/2024 15:53     Procedures   Medications Ordered in the ED - No data to display                                  Medical  Decision  Making This patient presents to the ED for concern of chest pain, this involves an extensive number of treatment options, and is a complaint that carries with it a high risk of complications and morbidity.  The differential diagnosis includes ACS/CAD, PTX, dissection, aneurysm, PNA, PE, GERD, MSK   Co morbidities that complicate the patient evaluation  History of pre-eclampsia (last delivery 03/2016), anemia   Additional history obtained:  Additional history and/or information obtained from chart review, notable for evaluated by cardiology 05/2016 for chest pain experienced through then recent pregnancy. 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  Exercise tolerance test at that time: Blood pressure and heart rate demonstrated a normal response to exercise. Blood pressure demonstrated a normal response to exercise. Overall, the patient's exercise capacity was normal.   85% of maximum heart rate was achieved after 7 minutes. Recovery time: 5 minutes.  Duke Treadmill Score: intermediate risk The patient's response to exercise was adequate for diagnosis.     Lab Tests:  I Ordered, and personally interpreted labs.  The pertinent results include:   D-dimer - <0.27 Troponin - <15 CBC: normal WBC, hgb and plts Cmet - no abnormalities   Imaging Studies ordered:  I ordered imaging studies including CXR I independently visualized and interpreted imaging which showed NAD I agree with the radiologist interpretation   Cardiac Monitoring:  The patient was maintained on a cardiac monitor.  I personally viewed and interpreted the cardiac monitored which showed an underlying rhythm of:  EKG Interpretation Date/Time:  Tuesday March 05 2024 14:05:04 EDT Ventricular Rate:  75 PR Interval:  181 QRS Duration:  91 QT Interval:  404 QTC Calculation: 452 R  Axis:   4  Text Interpretation: Sinus rhythm Low voltage, precordial leads Borderline T abnormalities, anterior leads Confirmed by Simon Rea [phone]) on 03/05/2024 2:09:13 PM    Test Considered:  N/a   Critical Interventions:  N/a   Consultations Obtained:  I requested consultation with the n/a,  and discussed lab and imaging findings as well as pertinent plan - they recommend: n/a   Problem List / ED Course:  Here with DOE and 1 week, no cough or fever Brief chest pain today as described in the HPI She reports concern for blood pressure elevation - not on any medications. 170 at home is highest  Well appearing. Labs reassuring. No evidence PE, PNA, ACS. Doubt dissection, no PTX She maintains 97-99% while ambulating - no report of SOB  She can be discharged home. Follow up with PCP recommended. BP form provided and instructions on measuring BP twice daily then sharing that information with PCP.    Reevaluation:  After the interventions noted above, I reevaluated the patient and found that they have :stayed the same - asymptomatic   Social Determinants of Health:  Mother of 8 children, nonsmoker   Disposition:  After consideration of the diagnostic results and the patients response to treatment, I feel that the patient would benefit from discharge home.   Amount and/or Complexity of Data Reviewed Labs: ordered. Radiology: ordered.        Final diagnoses:  SOB (shortness of breath)  Nonspecific chest pain    ED Discharge Orders     None          Odell Balls, PA-C 03/05/24 1636    Simon Rea SAILOR, MD 03/08/24 [phone]

## 2024-03-05 NOTE — Discharge Instructions (Signed)
 As we discussed, your tests, exam and xray tests are all negative. Follow up with your doctor for recheck if symptoms persist.   Measure your blood pressure twice a day at the same time everyday and keep the measurements recorded. Take this information to your doctor for review.

## 2024-03-28 ENCOUNTER — Ambulatory Visit
Admission: EM | Admit: 2024-03-28 | Discharge: 2024-03-28 | Disposition: A | Discharge status: Home / Self Care | Attending: Family Medicine | Admitting: Family Medicine

## 2024-03-28 DIAGNOSIS — I10 Essential (primary) hypertension: Secondary | ICD-10-CM

## 2024-03-28 MED ORDER — AMLODIPINE BESYLATE 2.5 MG PO TABS: 2.5000 mg | ORAL_TABLET | Freq: Every day | ORAL | 0 refills | Status: DC

## 2024-03-28 NOTE — Discharge Instructions (Addendum)
 Start amlodipine  for blood pressure. Take 1 tablet daily and follow up with your PCP.   For diabetes or elevated blood sugar, please make sure you are limiting and avoiding starchy, carbohydrate foods like pasta, breads, sweet breads, pastry, rice, potatoes, desserts. These foods can elevate your blood sugar. Also, limit and avoid drinks that contain a lot of sugar such as sodas, sweet teas, fruit juices.  Drinking plain water will be much more helpful, try 80 ounces of water daily.  It is okay to flavor your water naturally by cutting cucumber, lemon, mint or lime, placing it in a picture with water and drinking it over a period of 24-48 hours as long as it remains refrigerated.  For elevated blood pressure, make sure you are monitoring salt in your diet.  Do not eat restaurant foods and limit processed foods at home. I highly recommend you prepare and cook your own foods at home.  Processed foods include things like frozen meals, pre-seasoned meats and dinners, deli meats, canned foods as these foods contain a high amount of sodium/salt.  Make sure you are paying attention to sodium labels on foods you buy at the grocery store. Buy your spices separately such as garlic powder, onion powder, cumin, cayenne, parsley flakes so that you can avoid seasonings that contain salt. However, salt-free seasonings are available and can be used, an example is Mrs. Dash and includes a lot of different mixtures that do not contain salt.  Lastly, when cooking using oils that are healthier for you is important. This includes olive oil, avocado oil, canola oil. We have discussed a lot of foods to avoid but below is a list of foods that can be very healthy to use in your diet whether it is for diabetes, cholesterol, high blood pressure, or in general healthy eating.  Salads - kale, spinach, cabbage, spring mix, arugula Fruits - avocadoes, berries (blueberries, raspberries, blackberries), apples, oranges, pomegranate,  grapefruit, kiwi Vegetables - asparagus, cauliflower, broccoli, green beans, brussel sprouts, bell peppers, beets; stay away from or limit starchy vegetables like potatoes, carrots, peas Other general foods - kidney beans, egg whites, almonds, walnuts, sunflower seeds, pumpkin seeds, fat free yogurt, almond milk, flax seeds, quinoa, oats   DO NOT EAT ANY FOODS ON THIS LIST THAT YOU ARE ALLERGIC TO. For more specific needs, I highly recommend consulting a dietician or nutritionist but this can definitely be a good starting point.

## 2024-03-28 NOTE — ED Provider Notes (Signed)
 [facility]  Note:  This document was prepared using Conservation officer, historic buildings and may include unintentional dictation errors.  MRN: [id number] DOB: [birth year]  Subjective:   Christy [name] is a 43 y.o. female presenting for consultation for high blood pressure. Has had intermittent elevated readings. Reports dizziness when her blood pressure readings are high. Also feels tense right sided neck and trapezius pain with elevated bp readings. Generally her readings are 140s systolic when they are elevated. Has a history of pre-eclampsia but never diagnosed with HTN or take blood pressure medications. She did take bp medications after delivering for about 3 weeks since 2017. Has a PCP appointment in 2 weeks. No smoking of any kind including cigarettes, cigars, vaping, marijuana use. No alcohol use.  Of note, she had a recent visit to the [facility] emergency room for chest pain and shortness of breath.  She did have extensive workup within which was largely negative.  No current facility-administered medications for this encounter.  Current Outpatient Medications:    Ascorbic Acid (VITAMIN C PO), Take by mouth., Disp: , Rfl:    levonorgestrel  (MIRENA , 52 MG,) 20 MCG/24HR IUD, Mirena  20 mcg/24 hours (6 yrs) 52 mg intrauterine device  Take by intrauterine route., Disp: , Rfl:    Allergies  Allergen Reactions   Diflucan [Fluconazole]     Mouth and vagina area breaks out into hives   Sulfamethoxazole-Trimethoprim Hives   Latex Rash   Metronidazole  Rash    If takes longer than 5 days will develop a rash    Past Medical History:  Diagnosis Date   Anemia    Preeclampsia, severe 03/29/2016     Past Surgical History:  Procedure Laterality Date   NO PAST SURGERIES      Family History  Problem Relation Age of Onset   Hypertension Mother    Hypertension Father    Heart attack Father    Stroke Father    Hypertension Sister    Hypertension  Maternal Grandmother     Social History   Tobacco Use   Smoking status: Never   Smokeless tobacco: Never  Vaping Use   Vaping status: Never Used  Substance Use Topics   Alcohol use: No   Drug use: No    ROS   Objective:   Vitals: BP 133/89 (BP Location: Right Arm)   Pulse 83   Temp 98.4 F (36.9 C) (Oral)   Resp 16   LMP  (LMP Unknown)   SpO2 96%   BP Readings from Last 3 Encounters:  03/28/24 133/89  03/05/24 (!) 146/82  11/18/23 (!) 147/92   Physical Exam Constitutional:      General: She is not in acute distress.    Appearance: Normal appearance. She is well-developed. She is not ill-appearing, toxic-appearing or diaphoretic.  HENT:     Head: Normocephalic and atraumatic.     Right Ear: External ear normal.     Left Ear: External ear normal.     Nose: Nose normal.     Mouth/Throat:     Mouth: Mucous membranes are moist.  Eyes:     General: No scleral icterus.       Right eye: No discharge.        Left eye: No discharge.     Extraocular Movements: Extraocular movements intact.  Neck:     Thyroid: No thyroid mass, thyromegaly or thyroid tenderness.     Vascular: No carotid bruit.  Cardiovascular:  Rate and Rhythm: Normal rate and regular rhythm.     Heart sounds: Normal heart sounds. No murmur heard.    No friction rub. No gallop.  Pulmonary:     Effort: Pulmonary effort is normal. No respiratory distress.     Breath sounds: No stridor. No wheezing, rhonchi or rales.  Chest:     Chest wall: No tenderness.  Musculoskeletal:     Cervical back: Normal range of motion and neck supple. No rigidity.  Skin:    General: Skin is warm and dry.  Neurological:     General: No focal deficit present.     Mental Status: She is alert and oriented to person, place, and time.     Cranial Nerves: No cranial nerve deficit.     Motor: No weakness.     Coordination: Coordination normal.     Gait: Gait normal.  Psychiatric:        Mood and Affect: Mood normal.         Behavior: Behavior normal.     Assessment and Plan :   PDMP not reviewed this encounter.  1. Essential hypertension    Recommend patient start a low-dose blood pressure medication, amlodipine .  Discussed hypertensive friendly diet.  Follow-up with PCP.  Counseled patient on potential for adverse effects with medications prescribed/recommended today, ER and return-to-clinic precautions discussed, patient verbalized understanding.    Christopher Savannah, PA-C 03/28/24 1104

## 2024-03-28 NOTE — ED Triage Notes (Signed)
 Pt c/o elevated BP x ~1.5 months-denies dx HTN-states she was seen at ED 3 weeks ago-has not been able to f/u with PCP and was advised by PCP to UC-NAD-steady gait

## 2024-04-11 ENCOUNTER — Telehealth: Payer: Self-pay

## 2024-04-11 ENCOUNTER — Telehealth: Payer: Self-pay | Admitting: Urgent Care

## 2024-04-11 MED ORDER — AMLODIPINE BESYLATE 2.5 MG PO TABS: 2.5000 mg | ORAL_TABLET | Freq: Every day | ORAL | 0 refills | Status: DC

## 2024-04-11 MED ORDER — AMLODIPINE BESYLATE 2.5 MG PO TABS: 2.5000 mg | ORAL_TABLET | Freq: Every day | ORAL | 0 refills | Status: AC

## 2024-04-11 NOTE — Telephone Encounter (Signed)
 Patient lost her prescription supply for amlodipine  from her last visit.  Will send a courtesy refill.

## 2024-04-11 NOTE — Telephone Encounter (Signed)
 Resent it  amlodipine 

## 2024-04-11 NOTE — Telephone Encounter (Signed)
 Pt called and asked to change to [facility], as she had trouble with insurance at [employer]

## 2024-05-04 ENCOUNTER — Other Ambulatory Visit: Payer: Self-pay

## 2024-05-04 ENCOUNTER — Emergency Department (HOSPITAL_BASED_OUTPATIENT_CLINIC_OR_DEPARTMENT_OTHER)
Admission: EM | Admit: 2024-05-04 | Discharge: 2024-05-04 | Disposition: A | Discharge status: Home / Self Care | Attending: Emergency Medicine | Admitting: Emergency Medicine

## 2024-05-04 ENCOUNTER — Encounter (HOSPITAL_BASED_OUTPATIENT_CLINIC_OR_DEPARTMENT_OTHER): Payer: Self-pay | Admitting: Emergency Medicine

## 2024-05-04 DIAGNOSIS — N390 Urinary tract infection, site not specified: Secondary | ICD-10-CM | POA: Diagnosis not present

## 2024-05-04 DIAGNOSIS — Z91040 Latex allergy status: Secondary | ICD-10-CM | POA: Insufficient documentation

## 2024-05-04 DIAGNOSIS — R300 Dysuria: Secondary | ICD-10-CM | POA: Diagnosis present

## 2024-05-04 LAB — URINALYSIS, ROUTINE W REFLEX MICROSCOPIC
Bilirubin Urine: NEGATIVE
Glucose, UA: NEGATIVE mg/dL
Ketones, ur: NEGATIVE mg/dL
Nitrite: POSITIVE — AB
Protein, ur: 30 mg/dL — AB
Specific Gravity, Urine: 1.025 (ref 1.005–1.030)
pH: 6 (ref 5.0–8.0)

## 2024-05-04 LAB — PREGNANCY, URINE: Preg Test, Ur: NEGATIVE

## 2024-05-04 LAB — URINALYSIS, MICROSCOPIC (REFLEX): WBC, UA: >50 WBC/hpf (ref 0–5)

## 2024-05-04 MED ORDER — PHENAZOPYRIDINE HCL 200 MG PO TABS: 200.0000 mg | ORAL_TABLET | Freq: Three times a day (TID) | ORAL | 0 refills | Status: AC

## 2024-05-04 MED ORDER — CEPHALEXIN 250 MG PO CAPS
500.0000 mg | ORAL_CAPSULE | Freq: Once | ORAL | Status: AC
  Administered 2024-05-04: 500 mg via ORAL
  Filled 2024-05-04: qty 2

## 2024-05-04 MED ORDER — CEPHALEXIN 500 MG PO CAPS: 500.0000 mg | ORAL_CAPSULE | Freq: Two times a day (BID) | ORAL | 0 refills | Status: AC

## 2024-05-04 NOTE — ED Triage Notes (Signed)
 Sharp pain when she pees x 2 days  urine is cloudy  sharp abd pain she states she drank a new tea

## 2024-05-04 NOTE — ED Provider Notes (Signed)
  [facility] AT [facility] Provider Note   CSN: [id number] Arrival date & time: 05/04/24  1634     Patient presents with: Dysuria   Christy [name] is a 43 y.o. female patient with noncontributory past medical history reports to emergency room with complaint of suprapubic pain, dysuria, frequency and urgency.  This has been ongoing for 2 days.  She has also smelled a foul smell in her urine.  She denies any vaginal discharge.  She reports she is not worried about STDs.  No fevers or chills.  She is not having any flank pain or upper abdominal pain.    Dysuria      Prior to Admission medications   Medication Sig Start Date End Date Taking? Authorizing Provider  cephALEXin  (KEFLEX ) 500 MG capsule Take 1 capsule (500 mg total) by mouth 2 (two) times daily for 7 days. 05/04/24 05/11/24 Yes Dell Briner, Warren SAILOR, PA-C  phenazopyridine  (PYRIDIUM ) 200 MG tablet Take 1 tablet (200 mg total) by mouth 3 (three) times daily. 05/04/24  Yes Emalyn Schou, Warren SAILOR, PA-C  amLODipine  (NORVASC ) 2.5 MG tablet Take 1 tablet (2.5 mg total) by mouth daily. 04/11/24   Christopher Savannah, PA-C  Ascorbic Acid (VITAMIN C PO) Take by mouth.    [provider]  levonorgestrel  (MIRENA , 52 MG,) 20 MCG/24HR IUD Mirena  20 mcg/24 hours (6 yrs) 52 mg intrauterine device  Take by intrauterine route.    [provider]    Allergies: Diflucan [fluconazole], Sulfamethoxazole-trimethoprim, Latex, and Metronidazole     Review of Systems  Genitourinary:  Positive for dysuria.    Updated Vital Signs BP (!) 149/87 (BP Location: Right Arm)   Pulse 93   Temp 98.1 F (36.7 C) (Oral)   Resp 18   Ht 5' 4 (1.626 m)   Wt 90.7 kg   SpO2 99%   BMI 34.32 kg/m   Physical Exam Vitals and nursing note reviewed.  Constitutional:      General: She is not in acute distress.    Appearance: She is not toxic-appearing.  HENT:     Head: Normocephalic and atraumatic.  Eyes:     General: No scleral  icterus.    Conjunctiva/sclera: Conjunctivae normal.  Cardiovascular:     Rate and Rhythm: Normal rate and regular rhythm.     Pulses: Normal pulses.     Heart sounds: Normal heart sounds.  Pulmonary:     Effort: Pulmonary effort is normal. No respiratory distress.     Breath sounds: Normal breath sounds.  Abdominal:     General: Abdomen is flat. Bowel sounds are normal.     Palpations: Abdomen is soft.     Tenderness: There is abdominal tenderness in the suprapubic area.     Comments: Mild TTP to suprapubic region.   Musculoskeletal:     Right lower leg: No edema.     Left lower leg: No edema.  Skin:    General: Skin is warm and dry.     Findings: No lesion.  Neurological:     General: No focal deficit present.     Mental Status: She is alert and oriented to person, place, and time. Mental status is at baseline.     (all labs ordered are listed, but only abnormal results are displayed) Labs Reviewed  URINALYSIS, ROUTINE W REFLEX MICROSCOPIC - Abnormal; Notable for the following components:      Result Value   APPearance CLOUDY (*)    Hgb urine dipstick LARGE (*)  Protein, ur 30 (*)    Nitrite POSITIVE (*)    Leukocytes,Ua MODERATE (*)    All other components within normal limits  URINALYSIS, MICROSCOPIC (REFLEX) - Abnormal; Notable for the following components:   Bacteria, UA MANY (*)    All other components within normal limits  URINE CULTURE  PREGNANCY, URINE    EKG: None  Radiology: No results found.   Procedures   Medications Ordered in the ED  cephALEXin  (KEFLEX ) capsule 500 mg (has no administration in time range)                                    Medical Decision Making Amount and/or Complexity of Data Reviewed Labs: ordered.  Risk Prescription drug management.   This patient presents to the ED for concern of suprapubic pain, this involves an extensive number of treatment options, and is a complaint that carries with it a high risk of  complications and morbidity.  The differential diagnosis includes urinary tract infection, kidney stone, appendicitis, STD, pelvic inflammatory disease   Lab Tests:  I personally interpreted labs.  The pertinent results include:   UA positive for nitrites, leukocytes with many bacteria.  Will send for culture.  Pregnancy test is negative    Problem List / ED Course / Critical interventions / Medication management  Patient presents with symptoms consistent with urinary tract infection.  Her UA is grossly positive.  She is hemodynamically stable and well-appearing.  She does not have fevers or chills at home.  Urine has been sent for culture. Was given first dose of antibiotic here.  She will pick up antibiotic at pharmacy.  No sign of systemic illness.  No CVA tenderness thus doubt pyelonephritis.  No flank pain thus doubt kidney stone.  No significant abdominal tenderness on exam thus will not obtain CT scan or further workup.  Feel stable for discharge with outpatient follow-up.        Final diagnoses:  Lower urinary tract infectious disease    ED Discharge Orders          Ordered    cephALEXin  (KEFLEX ) 500 MG capsule  2 times daily        05/04/24 1731    phenazopyridine  (PYRIDIUM ) 200 MG tablet  3 times daily        05/04/24 1731               Clemence Lengyel, Warren SAILOR, PA-C 05/04/24 1734    Ruthe Cornet, DO 05/04/24 1804

## 2024-05-04 NOTE — Discharge Instructions (Signed)
 Take antibiotics twice a day for 7 days.   You can take Pyridium  3 times a day for 2 days. Return for flank pain, worsening abdominal pain nausea vomiting or fever.

## 2024-05-06 LAB — URINE CULTURE: Culture: >=100000 — AB

## 2024-05-07 ENCOUNTER — Telehealth (HOSPITAL_BASED_OUTPATIENT_CLINIC_OR_DEPARTMENT_OTHER): Payer: Self-pay | Admitting: *Deleted

## 2024-05-07 NOTE — Telephone Encounter (Signed)
 Post ED Visit - Positive Culture Follow-up  Culture report reviewed by antimicrobial stewardship pharmacist: [facility] []  Rankin Dee, Pharm.D. []  Venetia Gully, Pharm.D., BCPS AQ-ID []  Garrel Crews, Pharm.D., BCPS []  Almarie Lunger, [address].D., BCPS []  [place], [place].D., BCPS, AAHIVP []  Rosaline Bihari, Pharm.D., BCPS, AAHIVP [x]  Vernell Meier, PharmD, BCPS []  Latanya Hint, PharmD, BCPS []  Donald Medley, PharmD, BCPS []  Rocky Bold, PharmD []  Dorothyann Alert, PharmD, BCPS []  Morene Babe, PharmD  [facility] []  Rosaline Edison, PharmD []  Romona Bliss, PharmD []  Dolphus Roller, PharmD []  Veva Seip, Rph []  Vernell Daunt) Leonce, PharmD []  Eva Allis, PharmD []  Rosaline Millet, PharmD []  Iantha Batch, PharmD []  Arvin Gauss, PharmD []  Wanda Hasting, PharmD []  Ronal Rav, PharmD []  Rocky Slade, PharmD []  Bard Jeans, PharmD   Positive urine culture Treated with cephalexin , organism sensitive to the same and no further patient follow-up is required at this time.  Lorita Barnie Pereyra 05/07/2024, 9:30 AM

## 2024-05-31 ENCOUNTER — Encounter: Payer: Self-pay | Admitting: Physical Therapy

## 2024-05-31 ENCOUNTER — Other Ambulatory Visit: Payer: Self-pay

## 2024-05-31 ENCOUNTER — Ambulatory Visit: Payer: Self-pay | Attending: Family Medicine | Admitting: Physical Therapy

## 2024-05-31 DIAGNOSIS — R262 Difficulty in walking, not elsewhere classified: Secondary | ICD-10-CM | POA: Insufficient documentation

## 2024-05-31 DIAGNOSIS — M6281 Muscle weakness (generalized): Secondary | ICD-10-CM | POA: Insufficient documentation

## 2024-05-31 DIAGNOSIS — M25572 Pain in left ankle and joints of left foot: Secondary | ICD-10-CM | POA: Insufficient documentation

## 2024-05-31 DIAGNOSIS — R600 Localized edema: Secondary | ICD-10-CM | POA: Insufficient documentation

## 2024-05-31 NOTE — Therapy (Signed)
 OUTPATIENT PHYSICAL THERAPY LOWER EXTREMITY EVALUATION   Patient Name: Christy [name] MRN: [id number] DOB:02-12-1981, 43 y.o., female Today's Date: 05/31/2024  END OF SESSION:  PT End of Session - 05/31/24 1124     Visit Number 1    Number of Visits 12    Date for Recertification  07/26/24    Authorization Type [employer]    PT Start Time 1125   late arrival   PT Stop Time 1200    PT Time Calculation (min) 35 min    Activity Tolerance Patient tolerated treatment well          Past Medical History:  Diagnosis Date   Anemia    Preeclampsia, severe 03/29/2016   Past Surgical History:  Procedure Laterality Date   NO PAST SURGERIES     Patient Active Problem List   Diagnosis Date Noted   IUD (intrauterine device) in place 10/31/2017   SVD (spontaneous vaginal delivery) 09/22/2017   Noncompliance 07/26/2017   Anemia complicating pregnancy 07/14/2017   Supervision of other normal pregnancy, antepartum 05/01/2017   Encounter for supervision of multigravida with advanced maternal age 54/03/2016    PCP: Patient, No Pcp Per  REFERRING PROVIDER: Irving Delinda ORN, MD  REFERRING DIAG: [phone] (ICD-10-CM) - Sprain of left foot, initial encounter  THERAPY DIAG:  Pain in left ankle and joints of left foot  Muscle weakness (generalized)  Difficulty in walking, not elsewhere classified  Rationale for Evaluation and Treatment: Rehabilitation  ONSET DATE: 05/05/24  SUBJECTIVE:   SUBJECTIVE STATEMENT: Pt reports she had a fall on ~1 month ago. Pt states she was walking out of a store and hit a machine that was left by the door with her foot and fell on her knee. Pt states she twisted her R knee and L foot. Pt reports standing on her L foot causes pain and throbbing. Unable to stand longer than 10 minutes. Pt was given an ankle brace but sometimes it will make her foot hurt a little bit (thinks it may be too tight). Reports some mild swelling. Pt states she was walking and  running every day but no longer able to walk as much. Cannot tolerate any running now.   PERTINENT HISTORY: none  PAIN:  Are you having pain? Yes: NPRS scale: 0 at rest, at worst 8/10 Pain location: Whole Bottom of L foot (primarily laterally along pinky toe) to front of her shin Pain description: Aching Aggravating factors: prolonged standing/weight bearing Relieving factors: Elevating  PRECAUTIONS: None  RED FLAGS: None   WEIGHT BEARING RESTRICTIONS: No  FALLS:  Has patient fallen in last 6 months? Yes. 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  DIAGNOSTIC FINDINGS: Unable to see x-ray for L foot/ankle on Epic  PATIENT SURVEYS:  LEFS  Extreme difficulty/unable (0), Quite a bit of difficulty (1), Moderate difficulty (2), Little difficulty (3), No difficulty (4) Survey date:  05/31/24  Any of your usual work, housework or school activities 1  2. Usual hobbies, recreational or sporting activities 1  3. Getting into/out of the bath 3  4. Walking between rooms 3  5. Putting on socks/shoes 4  6. Squatting  1  7. Lifting an object, like a bag of groceries from the floor 3  8. Performing light activities around your home 2  9. Performing heavy activities around your home 1  10. Getting into/out of a car 2  11. Walking 2 blocks 1  12. Walking 1 mile 0  13. Going up/down 10 stairs (1 flight) 1  14. Standing for 1 hour 0  15.  sitting for 1 hour 2  16. Running on even ground 0  17. Running on uneven ground 0  18. Making  sharp turns while running fast 0  19. Hopping  0  20. Rolling over in bed 4  Score total:  29/80     COGNITION: Overall cognitive status: Within functional limits for tasks assessed     SENSATION: WFL currently Gets N/T in foot when driving and sleeping  EDEMA:  Mild edema  MUSCLE LENGTH: Did not assess  POSTURE: No Significant postural limitations  PALPATION: TTP along peroneals -- especially insertion to plantar surface of L foot, ant tib, and along 5th metatarsal (lateral and plantar surface)  LOWER EXTREMITY ROM:  Active ROM Right eval Left eval  Hip flexion    Hip extension    Hip abduction    Hip adduction    Hip internal rotation    Hip external rotation    Knee flexion    Knee extension    Ankle dorsiflexion 20 15 tense  Ankle plantarflexion 45 45  Ankle inversion 15 20  Ankle eversion 15 15   (Blank rows = not tested)  LOWER EXTREMITY MMT:  MMT Right eval Left eval  Hip flexion 4 4  Hip extension    Hip abduction    Hip adduction    Hip internal rotation    Hip external rotation    Knee flexion 4 3+  Knee extension 4 4  Ankle dorsiflexion 4 3+  Ankle plantarflexion 4 3+ (unable to perform single leg heel raise in standing)  Ankle inversion 4 3+ pain  Ankle eversion 4 4   (Blank rows = not tested)  LOWER EXTREMITY SPECIAL TESTS:  Did not assess  FUNCTIONAL TESTS:  SLS: Unable to perform on L Single leg heel raise: unable on L  GAIT: Distance walked: Into clinic Assistive device utilized: None Level of assistance: Complete Independence Comments: Mildly antalgic with L LE weight bearing                                                                                                                                TREATMENT DATE: 05/31/24 See HEP below    PATIENT EDUCATION:  Education details: Exam findings, POC, initial HEP Person educated: Patient Education method: Explanation, Demonstration, and Handouts Education  comprehension: verbalized understanding, returned demonstration, and needs further education  HOME EXERCISE PROGRAM: Access Code: [email or web address] URL: [email or web address] Date: 05/31/2024 Prepared  by: Juma Oxley April Earnie Starring  Exercises - Seated Ankle Eversion with Resistance  - 1 x daily - 7 x weekly - 3 sets - 10 reps - Seated Ankle Inversion with Resistance  - 1 x daily - 7 x weekly - 3 sets - 10 reps - Towel Scrunches  - 1 x daily - 7 x weekly - 3 sets - 10 reps - Seated Heel Raise  - 1 x daily - 7 x weekly - 3 sets - 10 reps  ASSESSMENT:  CLINICAL IMPRESSION: Patient is a 43 y.o. F who was seen today for physical therapy evaluation and treatment for L ankle sprain/foot pain. Pt had a fall ~1 month ago twisting her R knee and L foot/ankle. R knee pain is slowly decreasing; however, pt with great difficulty tolerating L LE weight bearing. Assessment is significant for full ankle AROM but most painful and weak with ankle inversion/DF and PF. Pt is tender along her L peroneals and tendinous insertion to the plantar surface of her 5th metatarsal. Pt will benefit from PT to improve on these deficits for return to pt's PLOF.   OBJECTIVE IMPAIRMENTS: Abnormal gait, decreased activity tolerance, decreased balance, decreased endurance, decreased strength, increased fascial restrictions, increased muscle spasms, impaired sensation, postural dysfunction, and pain.   ACTIVITY LIMITATIONS: standing, squatting, stairs, and locomotion level  PARTICIPATION LIMITATIONS: driving, shopping, community activity, and occupation  PERSONAL FACTORS: Age, Fitness, Past/current experiences, and Time since onset of injury/illness/exacerbation are also affecting patient's functional outcome.   REHAB POTENTIAL: Good  CLINICAL DECISION MAKING: Evolving/moderate complexity  EVALUATION COMPLEXITY: Moderate   GOALS: Goals reviewed with patient? Yes  SHORT TERM GOALS: Target date: 06/28/2024  Pt  will be ind with initial HEP Baseline: Goal status: INITIAL  2.  Pt will be able to perform single leg heel raise without any pain or issues on L Baseline:  Goal status: INITIAL  3.  Pt will be able to tolerate standing at least 15 min to fold laundry/wash dishes Baseline:  Goal status: INITIAL    LONG TERM GOALS: Target date: 07/26/2024   Pt will be ind with management and progression of HEP Baseline:  Goal status: INITIAL  2.  Pt will have improved LEFS to >/=38/80 to demo MCID Baseline:  Goal status: INITIAL  3.  Pt will be able to perform SLS on L for at least 20 sec to demo increased weight bearing tolerance/ankle stability Baseline:  Goal status: INITIAL  4.  Pt will report improved overall pain by >/=75% Baseline:  Goal status: INITIAL    PLAN:  PT FREQUENCY: 1-2x/week  PT DURATION: 8 weeks  PLANNED INTERVENTIONS: 97164- PT Re-evaluation, 97750- Physical Performance Testing, 97110-Therapeutic exercises, 97530- Therapeutic activity, [id number]- Neuromuscular re-education, 97535- Self Care, 02859- Manual therapy, [id number]- Gait training, [phone]- Electrical stimulation (unattended), 97016- Vasopneumatic device, [id number]- Ultrasound, [id number]- Ionotophoresis 4mg /ml Dexamethasone , 79439 (1-2 muscles), 20561 (3+ muscles)- Dry Needling, Patient/Family education, Balance training, Stair training, Taping, Joint mobilization, Cryotherapy, and Moist heat  PLAN FOR NEXT SESSION: Assess response to HEP. Work on ankle stability within pain free range. Foot strengthening.    Deija Buhrman April Ma L Sissy Goetzke, PT, DPT 05/31/2024, 12:37 PM

## 2024-06-07 ENCOUNTER — Ambulatory Visit (INDEPENDENT_AMBULATORY_CARE_PROVIDER_SITE_OTHER): Admitting: Family Medicine

## 2024-06-07 ENCOUNTER — Other Ambulatory Visit: Payer: Self-pay

## 2024-06-07 ENCOUNTER — Encounter: Payer: Self-pay | Admitting: Obstetrics and Gynecology

## 2024-06-07 ENCOUNTER — Ambulatory Visit: Payer: Self-pay | Admitting: Physical Therapy

## 2024-06-07 VITALS — BP 138/89 | HR 87 | Wt 191.6 lb

## 2024-06-07 DIAGNOSIS — Z30432 Encounter for removal of intrauterine contraceptive device: Secondary | ICD-10-CM | POA: Diagnosis not present

## 2024-06-07 DIAGNOSIS — R300 Dysuria: Secondary | ICD-10-CM

## 2024-06-07 LAB — POCT URINALYSIS DIP (DEVICE)
Bilirubin Urine: NEGATIVE
Glucose, UA: NEGATIVE mg/dL
Ketones, ur: NEGATIVE mg/dL
Leukocytes,Ua: NEGATIVE
Nitrite: NEGATIVE
Protein, ur: 30 mg/dL — AB
Specific Gravity, Urine: >=1.03 (ref 1.005–1.030)
Urobilinogen, UA: 1 mg/dL (ref 0.0–1.0)
pH: 6 (ref 5.0–8.0)

## 2024-06-07 NOTE — Assessment & Plan Note (Signed)
 Patient requesting IUD removal.  Is considering replacement but not right now and will come back for at a future visit for that.  IUD removed without difficulty.

## 2024-06-07 NOTE — Therapy (Incomplete)
 OUTPATIENT PHYSICAL THERAPY LOWER EXTREMITY EVALUATION   Patient Name: Christy [name] MRN: [id number] DOB:1981/03/29, 43 y.o., female Today's Date: 06/07/2024  END OF SESSION:    Past Medical History:  Diagnosis Date   Anemia    Preeclampsia, severe 03/29/2016   Past Surgical History:  Procedure Laterality Date   NO PAST SURGERIES     Patient Active Problem List   Diagnosis Date Noted   IUD (intrauterine device) in place 10/31/2017   SVD (spontaneous vaginal delivery) 09/22/2017   Noncompliance 07/26/2017   Anemia complicating pregnancy 07/14/2017   Supervision of other normal pregnancy, antepartum 05/01/2017   Encounter for supervision of multigravida with advanced maternal age 69/03/2016    PCP: Patient, No Pcp Per  REFERRING PROVIDER: Irving Delinda ORN, MD  REFERRING DIAG: [phone] (ICD-10-CM) - Sprain of left foot, initial encounter  THERAPY DIAG:  No diagnosis found.  Rationale for Evaluation and Treatment: Rehabilitation  ONSET DATE: 05/05/24  SUBJECTIVE:   SUBJECTIVE STATEMENT: ***  From eval: Pt reports she had a fall on ~1 month ago. Pt states she was walking out of a store and hit a machine that was left by the door with her foot and fell on her knee. Pt states she twisted her R knee and L foot. Pt reports standing on her L foot causes pain and throbbing. Unable to stand longer than 10 minutes. Pt was given an ankle brace but sometimes it will make her foot hurt a little bit (thinks it may be too tight). Reports some mild swelling. Pt states she was walking and running every day but no longer able to walk as much. Cannot tolerate any running now.   PERTINENT HISTORY: none  PAIN:  Are you having pain? Yes: NPRS scale: 0 at rest, at worst 8/10 Pain location: Whole Bottom of L foot (primarily laterally along pinky toe) to front of her shin Pain description: Aching Aggravating factors: prolonged standing/weight bearing Relieving factors:  Elevating  PRECAUTIONS: None  RED FLAGS: None   WEIGHT BEARING RESTRICTIONS: No  FALLS:  Has patient fallen in last 6 months? Yes. 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  DIAGNOSTIC FINDINGS: Unable to see x-ray for L foot/ankle on Epic  PATIENT SURVEYS:  LEFS  Extreme difficulty/unable (0), Quite a bit of difficulty (1), Moderate difficulty (2), Little difficulty (3), No difficulty (4) Survey date:  05/31/24  Any of your usual work, housework or school activities 1  2. Usual hobbies, recreational or sporting activities 1  3. Getting into/out of the bath 3  4. Walking between rooms 3  5. Putting on socks/shoes 4  6. Squatting  1  7. Lifting an object, like a bag of groceries from the floor 3  8. Performing light activities around your home 2  9. Performing heavy activities around your home 1  10. Getting into/out of a car 2  11. Walking 2 blocks 1  12. Walking 1 mile 0  13. Going up/down 10 stairs (1 flight) 1  14. Standing for 1 hour 0  15.  sitting for 1  hour 2  16. Running on even ground 0  17. Running on uneven ground 0  18. Making sharp turns while running fast 0  19. Hopping  0  20. Rolling over in bed 4  Score total:  29/80      SENSATION: WFL currently Gets N/T in foot when driving and sleeping  EDEMA:  Mild edema  PALPATION: TTP along peroneals -- especially insertion to plantar surface of L foot, ant tib, and along 5th metatarsal (lateral and plantar surface)  LOWER EXTREMITY ROM:  Active ROM  Right eval Left eval  Hip flexion    Hip extension    Hip abduction    Hip adduction    Hip internal rotation    Hip external rotation    Knee flexion    Knee extension    Ankle dorsiflexion 20 15 tense  Ankle plantarflexion 45 45  Ankle inversion 15 20  Ankle eversion 15 15   (Blank rows = not tested)  LOWER EXTREMITY MMT:  MMT Right eval Left eval  Hip flexion 4 4  Hip extension    Hip abduction    Hip adduction    Hip internal rotation    Hip external rotation    Knee flexion 4 3+  Knee extension 4 4  Ankle dorsiflexion 4 3+  Ankle plantarflexion 4 3+ (unable to perform single leg heel raise in standing)  Ankle inversion 4 3+ pain  Ankle eversion 4 4   (Blank rows = not tested)  LOWER EXTREMITY SPECIAL TESTS:  Did not assess  FUNCTIONAL TESTS:  SLS: Unable to perform on L Single leg heel raise: unable on L  GAIT: Distance walked: Into clinic Assistive device utilized: None Level of assistance: Complete Independence Comments: Mildly antalgic with L LE weight bearing                                                                                                                                TREATMENT DATE:  06/07/24 ***  05/31/24 See HEP below    PATIENT EDUCATION:  Education details: Exam findings, POC, initial HEP Person educated: Patient Education method: Explanation, Demonstration, and Handouts Education comprehension: verbalized understanding, returned demonstration, and needs further education  HOME EXERCISE PROGRAM: Access Code: [email or web address] URL: [email or web address] Date: 05/31/2024 Prepared by: Alyric Parkin April Earnie Starring  Exercises - Seated Ankle Eversion with Resistance  - 1 x daily - 7 x weekly - 3 sets - 10 reps - Seated Ankle Inversion with Resistance  - 1 x daily - 7 x weekly - 3 sets - 10 reps - Towel Scrunches  - 1 x daily - 7 x weekly - 3 sets - 10 reps - Seated Heel Raise  - 1 x daily - 7 x weekly - 3 sets - 10  reps  ASSESSMENT:  CLINICAL IMPRESSION: ***  From eval: Patient is a 43 y.o. F who was seen today for physical therapy evaluation and treatment  for L ankle sprain/foot pain. Pt had a fall ~1 month ago twisting her R knee and L foot/ankle. R knee pain is slowly decreasing; however, pt with great difficulty tolerating L LE weight bearing. Assessment is significant for full ankle AROM but most painful and weak with ankle inversion/DF and PF. Pt is tender along her L peroneals and tendinous insertion to the plantar surface of her 5th metatarsal. Pt will benefit from PT to improve on these deficits for return to pt's PLOF.   OBJECTIVE IMPAIRMENTS: Abnormal gait, decreased activity tolerance, decreased balance, decreased endurance, decreased strength, increased fascial restrictions, increased muscle spasms, impaired sensation, postural dysfunction, and pain.   ACTIVITY LIMITATIONS: standing, squatting, stairs, and locomotion level  PARTICIPATION LIMITATIONS: driving, shopping, community activity, and occupation  PERSONAL FACTORS: Age, Fitness, Past/current experiences, and Time since onset of injury/illness/exacerbation are also affecting patient's functional outcome.   REHAB POTENTIAL: Good  CLINICAL DECISION MAKING: Evolving/moderate complexity  EVALUATION COMPLEXITY: Moderate   GOALS: Goals reviewed with patient? Yes  SHORT TERM GOALS: Target date: 06/28/2024  Pt will be ind with initial HEP Baseline: Goal status: INITIAL  2.  Pt will be able to perform single leg heel raise without any pain or issues on L Baseline:  Goal status: INITIAL  3.  Pt will be able to tolerate standing at least 15 min to fold laundry/wash dishes Baseline:  Goal status: INITIAL    LONG TERM GOALS: Target date: 07/26/2024   Pt will be ind with management and progression of HEP Baseline:  Goal status: INITIAL  2.  Pt will have improved LEFS to >/=38/80 to demo MCID Baseline:  Goal status:  INITIAL  3.  Pt will be able to perform SLS on L for at least 20 sec to demo increased weight bearing tolerance/ankle stability Baseline:  Goal status: INITIAL  4.  Pt will report improved overall pain by >/=75% Baseline:  Goal status: INITIAL    PLAN:  PT FREQUENCY: 1-2x/week  PT DURATION: 8 weeks  PLANNED INTERVENTIONS: 97164- PT Re-evaluation, 97750- Physical Performance Testing, 97110-Therapeutic exercises, 97530- Therapeutic activity, [id number]- Neuromuscular re-education, 97535- Self Care, 02859- Manual therapy, [id number]- Gait training, [phone]- Electrical stimulation (unattended), 97016- Vasopneumatic device, [id number]- Ultrasound, [id number]- Ionotophoresis 4mg /ml Dexamethasone , 79439 (1-2 muscles), 20561 (3+ muscles)- Dry Needling, Patient/Family education, Balance training, Stair training, Taping, Joint mobilization, Cryotherapy, and Moist heat  PLAN FOR NEXT SESSION: Assess response to HEP. Work on ankle stability within pain free range. Foot strengthening.    Elizabeht Suto April Ma L Ander Wamser, PT, DPT 06/07/2024, 10:05 AM

## 2024-06-07 NOTE — Assessment & Plan Note (Signed)
 Patient with a recent UTI and was treated.  Reports she still has minor symptoms.  Urinalysis showing moderate blood and trace protein.  Will send for urine culture and treat if needed.

## 2024-06-07 NOTE — Progress Notes (Signed)
 GYNECOLOGY VISIT  Patient name: Christy [name] MRN [id number]  Date of birth: 18-Jan-1981 Chief Complaint:   Procedure   History:  Yanelli Zapanta  has been having increased UTI frequency and feels it moving and weird feeling to disescribe. Pain and bladder irritation. Has discomfort during intercourse.   Patient reports that she has had her IUD in for approximately 7 years and that she feels like she can feel it moving when she has sex and feels like it is in the wrong location.  With having intercourse recently and noticed severe pain that lasted for some time but resolved.  Would like her IUD removed.  Is using condoms every time anyway because she thinks that the IUD is in the wrong location.  Is considering another IUD but does not want it right now and will come back to have it placed.  Patient is also concerned that her IUD may be causing UTIs.  Reports that she wipes appropriately but she has noticed more UTIs in the past few months.  Had a severe UTI in September and feels like she is having symptoms again but not as severe as before.  Would like to be evaluated for UTI.  The following portions of the patient's history were reviewed and updated as appropriate: allergies, current medications, past family history, past medical history, past social history, past surgical history and problem list.   Health Maintenance:   Last pap     Component Value Date/Time   DIAGPAP  03/15/2023 1522    - Negative for intraepithelial lesion or malignancy (NILM)   HPVHIGH Negative 03/15/2023 1522   ADEQPAP  03/15/2023 1522    Satisfactory for evaluation; transformation zone component PRESENT.    Health Maintenance  Topic Date Due   Hepatitis C Screening  Never done   Hepatitis B Vaccine (1 of 3 - 19+ 3-dose series) Never done   HPV Vaccine (1 - 3-dose SCDM series) Never done   Breast Cancer Screening  Never done   Flu Shot  Never done   COVID-19 Vaccine (1 - 2025-26 season) Never done    DTaP/Tdap/Td vaccine (3 - Td or Tdap) 07/15/2027   Pap with HPV screening  03/14/2028   HIV Screening  Completed   Pneumococcal Vaccine  Aged Out   Meningitis B Vaccine  Aged Out      Review of Systems:  Pertinent items are noted in HPI. Comprehensive review of systems was otherwise negative.   Objective:  Physical Exam BP 138/89   Pulse 87   Wt 191 lb 9.6 oz (86.9 kg)   LMP 05/30/2024 (Approximate)   BMI 32.89 kg/m    Physical Exam Constitutional:      Appearance: Normal appearance.  HENT:     Head: Normocephalic and atraumatic.     Right Ear: External ear normal.     Left Ear: External ear normal.  Eyes:     Extraocular Movements: Extraocular movements intact.  Cardiovascular:     Rate and Rhythm: Normal rate.  Abdominal:     Palpations: Abdomen is soft.  Genitourinary:    General: Normal vulva.     Vagina: No vaginal discharge.     Rectum: Guaiac result negative.  Neurological:     Mental Status: She is alert.     PROCEDURE NOTE: IUD removal Patient given informed consent for IUD removal. She is aware this will stop the birth control method provided by the IUD immediately. Informed consent given and signed copy in the  chart.Appropriate time out taken. Patient placed in the lithotomy position and the cervix brought into view using speculum. The IUD strings were identified coming from the cervical os. These strings were grasped with ring forceps, and the IUD withdrawn gently from the uterus. There were no complications and no blood loss. Patient tolerated the procedure well.  Labs and Imaging No results found.     Assessment & Plan:  Encounter for IUD removal Patient requesting IUD removal.  Is considering replacement but not right now and will come back for at a future visit for that.  IUD removed without difficulty.  Dysuria Patient with a recent UTI and was treated.  Reports she still has minor symptoms.  Urinalysis showing moderate blood and trace  protein.  Will send for urine culture and treat if needed.   Routine preventative health maintenance measures emphasized.  Danise Dehne V Anjali Manzella, MD Minimally Invasive Gynecologic Surgery [facility], [facility]

## 2024-06-09 LAB — URINE CULTURE

## 2024-06-10 ENCOUNTER — Ambulatory Visit: Payer: Self-pay | Admitting: Family Medicine

## 2024-06-11 ENCOUNTER — Ambulatory Visit: Payer: Self-pay | Attending: Family Medicine | Admitting: Physical Therapy

## 2024-06-11 ENCOUNTER — Encounter: Payer: Self-pay | Admitting: Physical Therapy

## 2024-06-11 DIAGNOSIS — R262 Difficulty in walking, not elsewhere classified: Secondary | ICD-10-CM | POA: Insufficient documentation

## 2024-06-11 DIAGNOSIS — M25572 Pain in left ankle and joints of left foot: Secondary | ICD-10-CM | POA: Insufficient documentation

## 2024-06-11 DIAGNOSIS — M6281 Muscle weakness (generalized): Secondary | ICD-10-CM | POA: Insufficient documentation

## 2024-06-11 DIAGNOSIS — R600 Localized edema: Secondary | ICD-10-CM | POA: Insufficient documentation

## 2024-06-11 NOTE — Therapy (Signed)
 OUTPATIENT PHYSICAL THERAPY TREATMENT   Patient Name: Christy [name] MRN: [id number] DOB:[birth year], 43 y.o., female Today's Date: 06/11/2024  END OF SESSION:  PT End of Session - 06/11/24 0813     Visit Number 2    Number of Visits 12    Date for Recertification  07/26/24    Authorization Type [employer]    PT Start Time 0813   late arrival   PT Stop Time 0841    PT Time Calculation (min) 28 min    Activity Tolerance Patient tolerated treatment well           Past Medical History:  Diagnosis Date   Anemia    Preeclampsia, severe 03/29/2016   Past Surgical History:  Procedure Laterality Date   NO PAST SURGERIES     Patient Active Problem List   Diagnosis Date Noted   Dysuria 06/07/2024   Encounter for IUD removal 06/07/2024   IUD (intrauterine device) in place 10/31/2017   SVD (spontaneous vaginal delivery) 09/22/2017   Noncompliance 07/26/2017   Anemia complicating pregnancy 07/14/2017   Supervision of other normal pregnancy, antepartum 05/01/2017   Encounter for supervision of multigravida with advanced maternal age 01/14/2016    PCP: Patient, No Pcp Per  REFERRING PROVIDER: Irving Delinda ORN, MD  REFERRING DIAG: [phone] (ICD-10-CM) - Sprain of left foot, initial encounter  THERAPY DIAG:  Pain in left ankle and joints of left foot  Muscle weakness (generalized)  Difficulty in walking, not elsewhere classified  Localized edema  Rationale for Evaluation and Treatment: Rehabilitation  ONSET DATE: 05/05/24  SUBJECTIVE:   SUBJECTIVE STATEMENT: Pt states that it's been getting better. On/off pain depending. Still not running. Able to stand a little bit longer. Has been doing exercises without any issues.   From eval: Pt reports she had a fall on ~1 month ago. Pt states she was walking out of a store and hit a machine that was left by the door with her foot and fell on her knee. Pt states she twisted her R knee and L foot. Pt reports standing on her  L foot causes pain and throbbing. Unable to stand longer than 10 minutes. Pt was given an ankle brace but sometimes it will make her foot hurt a little bit (thinks it may be too tight). Reports some mild swelling. Pt states she was walking and running every day but no longer able to walk as much. Cannot tolerate any running now.   PERTINENT HISTORY: none  PAIN:  Are you having pain? Yes: NPRS scale: 0 at rest, at worst 8/10 Pain location: Whole Bottom of L foot (primarily laterally along pinky toe) to front of her shin Pain description: Aching Aggravating factors: prolonged standing/weight bearing Relieving factors: Elevating  PRECAUTIONS: None  RED FLAGS: None   WEIGHT BEARING RESTRICTIONS: No  FALLS:  Has patient fallen in last 6 months? Yes. 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  DIAGNOSTIC FINDINGS: Unable to see x-ray for L foot/ankle on Epic  PATIENT SURVEYS:  LEFS  Extreme difficulty/unable (0), Quite a bit  of difficulty (1), Moderate difficulty (2), Little difficulty (3), No difficulty (4) Survey date:  05/31/24  Any of your usual work, housework or school activities 1  2. Usual hobbies, recreational or sporting activities 1  3. Getting into/out of the bath 3  4. Walking between rooms 3  5. Putting on socks/shoes 4  6. Squatting  1  7. Lifting an object, like a bag of groceries from the floor 3  8. Performing light activities around your home 2  9. Performing heavy activities around your home 1  10. Getting  into/out of a car 2  11. Walking 2 blocks 1  12. Walking 1 mile 0  13. Going up/down 10 stairs (1 flight) 1  14. Standing for 1 hour 0  15.  sitting for 1 hour 2  16. Running on even ground 0  17. Running on uneven ground 0  18. Making sharp turns while running fast 0  19. Hopping  0  20. Rolling over in bed 4  Score total:  29/80      SENSATION: WFL currently Gets N/T in foot when driving and sleeping  EDEMA:  Mild edema  PALPATION: TTP along peroneals -- especially insertion to plantar surface of L foot, ant tib, and along 5th metatarsal (lateral and plantar surface)  LOWER EXTREMITY ROM:  Active ROM Right eval Left eval  Hip flexion    Hip extension    Hip abduction    Hip adduction    Hip internal rotation    Hip external rotation    Knee flexion    Knee extension    Ankle dorsiflexion 20 15 tense  Ankle plantarflexion 45 45  Ankle inversion 15 20  Ankle eversion 15 15   (Blank rows = not tested)  LOWER EXTREMITY MMT:  MMT Right eval Left eval  Hip flexion 4 4  Hip extension    Hip abduction    Hip adduction    Hip internal rotation    Hip external rotation    Knee flexion 4 3+  Knee extension 4 4  Ankle dorsiflexion 4 3+  Ankle plantarflexion 4 3+ (unable to perform single leg heel raise in standing)  Ankle inversion 4 3+ pain  Ankle eversion 4 4   (Blank rows = not tested)  LOWER EXTREMITY SPECIAL TESTS:  Did not assess  FUNCTIONAL TESTS:  SLS: Unable to perform on L Single leg heel raise: unable on L  GAIT: Distance walked: Into clinic Assistive device utilized: None Level of assistance: Complete Independence Comments: Mildly antalgic with L LE weight bearing                                                                                                                                TREATMENT DATE:  06/07/24 Nustep L3 x 6 min LEs only Standing gastroc stretch x 30 Standing soleus stretch x 30 Towel scrunch x 30 Marble  pick up 2x10 Seated  ankle eversion red TB 3x10 Seated ankle inversion red TB 3x10 Standing double leg heel raise with ball squeeze 2x10 Standing double leg heel raise with toes turned out 2x10 Tandem stance 2x20  05/31/24 See HEP below    PATIENT EDUCATION:  Education details: Exam findings, POC, initial HEP Person educated: Patient Education method: Explanation, Demonstration, and Handouts Education comprehension: verbalized understanding, returned demonstration, and needs further education  HOME EXERCISE PROGRAM: Access Code: [email or web address] URL: [email or web address] Date: 05/31/2024 Prepared by: Shirleyann Montero April Earnie Starring  Exercises - Seated Ankle Eversion with Resistance  - 1 x daily - 7 x weekly - 3 sets - 10 reps - Seated Ankle Inversio n with Resistance  - 1 x daily - 7 x weekly - 3 sets - 10 reps - Towel Scrunches  - 1 x daily - 7 x weekly - 3 sets - 10 reps - Seated Heel Raise  - 1 x daily - 7 x weekly - 3 sets - 10 reps  ASSESSMENT:  CLINICAL IMPRESSION: Pt did well with initial HEP. Progressed her accordingly to red TB and more standing strengthening. Discussed continuing to ice after exercises as needed. Worked on improving standing stability.   From eval: Patient is a 43 y.o. F who was seen today for physical therapy evaluation and treatment for L ankle sprain/foot pain. Pt had a fall ~1 month ago twisting her R knee and L foot/ankle. R knee pain is slowly decreasing; however, pt with great difficulty tolerating L LE weight bearing. Assessment is significant for full ankle AROM but most painful and weak with ankle inversion/DF and PF. Pt is tender along her L peroneals and tendinous insertion to the plantar surface of her 5th metatarsal. Pt will benefit from PT to improve on these deficits for return to pt's PLOF.   OBJECTIVE IMPAIRMENTS: Abnormal gait, decreased activity tolerance, decreased balance, decreased endurance, decreased strength, increased fascial  restrictions, increased muscle spasms, impaired sensation, postural dysfunction, and pain.   ACTIVITY LIMITATIONS: standing, squatting, stairs, and locomotion level  PARTICIPATION LIMITATIONS: driving, shopping, community activity, and occupation  PERSONAL FACTORS: Age, Fitness, Past/current experiences, and Time since onset of injury/illness/exacerbation are also affecting patient's functional outcome.   REHAB POTENTIAL: Good  CLINICAL DECISION MAKING: Evolving/moderate complexity  EVALUATION COMPLEXITY: Moderate   GOALS: Goals reviewed with patient? Yes  SHORT TERM GOALS: Target date: 06/28/2024  Pt will be ind with initial HEP Baseline: Goal status: INITIAL  2.  Pt will be able to perform single leg heel raise without any pain or issues on L Baseline:  Goal status: INITIAL  3.  Pt will be able to tolerate standing at least 15 min to fold laundry/wash dishes Baseline:  Goal status: INITIAL    LONG TERM GOALS: Target date: 07/26/2024   Pt will be ind with management and progression of HEP Baseline:  Goal status: INITIAL  2.  Pt will have improved LEFS to >/=38/80 to demo MCID Baseline:  Goal status: INITIAL  3.  Pt will be able to perform SLS on L for at least 20 sec to demo increased weight bearing tolerance/ankle stability Baseline:  Goal status: INITIAL  4.  Pt will report improved overall pain by >/=75% Baseline:  Goal status: INITIAL    PLAN:  PT FREQUENCY: 1-2x/week  PT DURATION: 8 weeks  PLANNED INTERVENTIONS: 97164- PT Re-evaluation, 97750- Physical Performance Testing, 97110-Therapeutic exercises, 97530- Therapeutic activity, [id number]- Neuromuscular re-education, 97535- Self Care, 02859- Manual therapy, [id number]- Gait training, H9716- Electrical stimulation (unattended), 97016- Vasopneumatic device,  02964- Ultrasound, 02966- Ionotophoresis 4mg /ml Dexamethasone , 79439 (1-2 muscles), 20561 (3+ muscles)- Dry Needling, Patient/Family education, Balance  training, Stair training, Taping, Joint mobilization, Cryotherapy, and Moist heat  PLAN FOR NEXT SESSION: Assess response to HEP. Work on ankle stability within pain free range. Foot strengthening.    Soniyah Mcglory April Ma L Haidan Nhan, PT, DPT 06/11/2024, 8:13 AM

## 2024-06-13 ENCOUNTER — Ambulatory Visit: Admitting: Obstetrics and Gynecology

## 2024-06-21 ENCOUNTER — Ambulatory Visit: Payer: Self-pay | Admitting: Physical Therapy

## 2024-06-21 ENCOUNTER — Encounter: Payer: Self-pay | Admitting: Physical Therapy

## 2024-06-21 DIAGNOSIS — M25572 Pain in left ankle and joints of left foot: Secondary | ICD-10-CM

## 2024-06-21 DIAGNOSIS — R600 Localized edema: Secondary | ICD-10-CM

## 2024-06-21 DIAGNOSIS — R262 Difficulty in walking, not elsewhere classified: Secondary | ICD-10-CM

## 2024-06-21 DIAGNOSIS — M6281 Muscle weakness (generalized): Secondary | ICD-10-CM

## 2024-06-21 NOTE — Therapy (Signed)
 OUTPATIENT PHYSICAL THERAPY TREATMENT   Patient Name: Christy [name] MRN: [id number] DOB:April 16, 1981, 43 y.o., female Today's Date: 06/21/2024  END OF SESSION:  PT End of Session - 06/21/24 1028     Visit Number 3    Number of Visits 12    Date for Recertification  07/26/24    Authorization Type [employer]    PT Start Time 1028   late arrival   PT Stop Time 1100    PT Time Calculation (min) 32 min    Activity Tolerance Patient tolerated treatment well            Past Medical History:  Diagnosis Date   Anemia    Preeclampsia, severe 03/29/2016   Past Surgical History:  Procedure Laterality Date   NO PAST SURGERIES     Patient Active Problem List   Diagnosis Date Noted   Dysuria 06/07/2024   Encounter for IUD removal 06/07/2024   IUD (intrauterine device) in place 10/31/2017   SVD (spontaneous vaginal delivery) 09/22/2017   Noncompliance 07/26/2017   Anemia complicating pregnancy 07/14/2017   Supervision of other normal pregnancy, antepartum 05/01/2017   Encounter for supervision of multigravida with advanced maternal age 71/03/2016    PCP: Patient, No Pcp Per  REFERRING PROVIDER: Irving Delinda ORN, MD  REFERRING DIAG: S93.602A (ICD-10-CM) - Sprain of left foot, initial encounter  THERAPY DIAG:  Pain in left ankle and joints of left foot  Muscle weakness (generalized)  Difficulty in walking, not elsewhere classified  Localized edema  Rationale for Evaluation and Treatment: Rehabilitation  ONSET DATE: 05/05/24  SUBJECTIVE:   SUBJECTIVE STATEMENT: Has been walking more. Able to stand longer. Worst pain level up to 6.   From eval: Pt reports she had a fall on ~1 month ago. Pt states she was walking out of a store and hit a machine that was left by the door with her foot and fell on her knee. Pt states she twisted her R knee and L foot. Pt reports standing on her L foot causes pain and throbbing. Unable to stand longer than 10 minutes. Pt was given an  ankle brace but sometimes it will make her foot hurt a little bit (thinks it may be too tight). Reports some mild swelling. Pt states she was walking and running every day but no longer able to walk as much. Cannot tolerate any running now.   PERTINENT HISTORY: none  PAIN:  Are you having pain? Yes: NPRS scale: 0 at rest, at worst 8/10 Pain location: Whole Bottom of L foot (primarily laterally along pinky toe) to front of her shin Pain description: Aching Aggravating factors: prolonged standing/weight bearing Relieving factors: Elevating  PRECAUTIONS: None  RED FLAGS: None   WEIGHT BEARING RESTRICTIONS: No  FALLS:  Has patient fallen in last 6 months? Yes. 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  DIAGNOSTIC FINDINGS: Unable to see x-ray for L foot/ankle on Epic  PATIENT SURVEYS:  LEFS  Extreme difficulty/unable (0), Quite a bit of difficulty (1), Moderate difficulty (2), Little difficulty (3), No difficulty (4)  Survey date:  05/31/24  Any of your usual work, housework or school activities 1  2. Usual hobbies, recreational or sporting activities 1  3. Getting into/out of the bath 3  4. Walking between rooms 3  5. Putting on socks/shoes 4  6. Squatting  1  7. Lifting an object, like a bag of groceries from the floor 3  8. Performing light activities around your home 2  9. Performing heavy activities around your home 1  10. Getting into/out of a car 2  11. Walking 2 blocks 1  12. Walking 1 mile 0  13. Going up/down 10  stairs (1 flight) 1  14. Standing for 1 hour 0  15.  sitting for 1 hour 2  16. Running on even ground 0  17. Running on uneven ground 0  18. Making sharp turns while running fast 0  19. Hopping  0  20. Rolling over in bed 4  Score total:  29/80      SENSATION: WFL currently Gets N/T in foot when driving and sleeping  EDEMA:  Mild edema  PALPATION: TTP along peroneals -- especially insertion to plantar surface of L foot, ant tib, and along 5th metatarsal (lateral and plantar surface)  LOWER EXTREMITY ROM:  Active ROM Right eval Left eval  Hip flexion    Hip extension    Hip abduction    Hip adduction    Hip internal rotation    Hip external rotation    Knee flexion    Knee extension    Ankle dorsiflexion 20 15 tense  Ankle plantarflexion 45 45  Ankle inversion 15 20  Ankle eversion 15 15   (Blank rows = not tested)  LOWER EXTREMITY MMT:  MMT Right eval Left eval  Hip flexion 4 4  Hip extension    Hip abduction    Hip adduction    Hip internal rotation    Hip external rotation    Knee flexion 4 3+  Knee extension 4 4  Ankle dorsiflexion 4 3+  Ankle plantarflexion 4 3+ (unable to perform single leg heel raise in standing)  Ankle inversion 4 3+ pain  Ankle eversion 4 4   (Blank rows = not tested)  LOWER EXTREMITY SPECIAL TESTS:  Did not assess  FUNCTIONAL TESTS:  SLS: Unable to perform on L Single leg heel raise: unable on L  GAIT: Distance walked: Into clinic Assistive device utilized: None Level of assistance: Complete Independence Comments: Mildly antalgic with L LE weight bearing                                                                                                                                TREATMENT DATE:  06/21/24 Nustep L6 x 8 min LEs only Standing gastroc stretch on slant board x 30 Standing soleus stretch on slant board x 30 Standing eccentric heel raise 2x8 Standing double leg heel raise with ball squeeze  2x10 Tandem  stance x30 Single leg clock reach x10 Seated ankle eversion green TB 3x10 Seated ankle inversion green TB 3x10   06/07/24 Nustep L3 x 6 min LEs only Standing gastroc stretch x 30 Standing soleus stretch x 30 Towel scrunch x 30 Marble pick up 2x10 Seated ankle eversion red TB 3x10 Seated ankle inversion red TB 3x10 Standing double leg heel raise with ball squeeze 2x10 Standing double leg heel raise with toes turned out 2x10 Tandem stance 2x20  05/31/24 See HEP below    PATIENT EDUCATION:  Education details: Exam findings, POC, initial HEP Person educated: Patient Education method: Explanation, Demonstration, and Handouts Education comprehension: verbalized understanding, returned demonstration, and needs further education  HOME EXERCISE PROGRAM: Access Code: [email or web address] URL: [email or web address] Date: 06/21/2024 Prepared by: Jamarco Zaldivar April Earnie Starring  Exercises - Seated Ankle Eversion with Resistance  - 1 x daily - 7 x weekly - 3 sets - 10 reps - Seated Ankle Inversion with Resistance  - 1 x daily - 7 x weekly - 3 sets - 10 reps - Standing Calf Raise With Small Ball at Heels  - 1 x daily - 7 x weekly - 2 sets - 10 reps - Gastroc Stretch on Wall  - 1 x daily - 7 x weekly - 2 sets - 30 sec hold - Soleus Stretch on Wall  - 1 x daily - 7 x weekly - 2 sets - 30 sec hold - Standing Eccentric Heel Raise  - 1 x daily - 7 x weekly - 2 sets - 10 reps - Single Leg Balance with Clock Reach  - 1 x daily - 7 x weekly - 1 sets - 10 reps  ASSESSMENT:  CLINICAL IMPRESSION: Pt continues to progress in her ankle strength and endurance. Able to perform L single leg heel raise in partial range. Working on improving single leg heel raise to be equal to R to slowly progress towards push off for running/plyometric activities. Progressed stabilizing exercises into more time in single limb stability.   From eval: Patient is a 43 y.o. F who was seen today for  physical therapy evaluation and treatment for L ankle sprain/foot pain. Pt had a fall ~1 month ago twisting her R knee and L foot/ankle. R knee pain is slowly decreasing; however, pt with great difficulty tolerating L LE weight bearing. Assessment is significant for full ankle AROM but most painful and weak with ankle inversion/DF and PF. Pt is tender along her L peroneals and tendinous insertion to the plantar surface of her 5th metatarsal. Pt will benefit from PT to improve on these deficits for return to pt's PLOF.   OBJECTIVE IMPAIRMENTS: Abnormal gait, decreased activity tolerance, decreased balance, decreased endurance, decreased strength, increased fascial restrictions, increased muscle spasms, impaired sensation, postural dysfunction, and pain.   ACTIVITY LIMITATIONS: standing, squatting, stairs, and locomotion level  PARTICIPATION LIMITATIONS: driving, shopping, community activity, and occupation  PERSONAL FACTORS: Age, Fitness, Past/current experiences, and Time since onset of injury/illness/exacerbation are also affecting patient's functional outcome.   REHAB POTENTIAL: Good  CLINICAL DECISION MAKING: Evolving/moderate complexity  EVALUATION COMPLEXITY: Moderate   GOALS: Goals reviewed with patient? Yes  SHORT TERM GOALS: Target date: 06/28/2024  Pt will be ind with initial HEP Baseline: Goal status: MET  2.  Pt will be able to perform single leg heel raise without any pain or issues on L Baseline:  06/21/24: 50% of R LE's heel raise height but no pain Goal status: IN PROGRESS  3.  Pt will be  able to tolerate standing at least 15 min to fold laundry/wash dishes Baseline:  Goal status: INITIAL    LONG TERM GOALS: Target date: 07/26/2024   Pt will be ind with management and progression of HEP Baseline:  Goal status: INITIAL  2.  Pt will have improved LEFS to >/=38/80 to demo MCID Baseline:  Goal status: INITIAL  3.  Pt will be able to perform SLS on L for at  least 20 sec to demo increased weight bearing tolerance/ankle stability Baseline:  Goal status: INITIAL  4.  Pt will report improved overall pain by >/=75% Baseline:  Goal status: INITIAL    PLAN:  PT FREQUENCY: 1-2x/week  PT DURATION: 8 weeks  PLANNED INTERVENTIONS: 97164- PT Re-evaluation, 97750- Physical Performance Testing, 97110-Therapeutic exercises, 97530- Therapeutic activity, [id number]- Neuromuscular re-education, 97535- Self Care, 02859- Manual therapy, [id number]- Gait training, [phone]- Electrical stimulation (unattended), 97016- Vasopneumatic device, [id number]- Ultrasound, [id number]- Ionotophoresis 4mg /ml Dexamethasone , 79439 (1-2 muscles), 20561 (3+ muscles)- Dry Needling, Patient/Family education, Balance training, Stair training, Taping, Joint mobilization, Cryotherapy, and Moist heat  PLAN FOR NEXT SESSION: Assess response to HEP. Work on ankle stability within pain free range. Foot strengthening. Single limb strength/stability on L. Improving ankle strength for push off to progress towards running as tolerated.    Devontaye Ground April Ma L Aayla Marrocco, PT, DPT 06/21/2024, 10:28 AM

## 2024-06-28 ENCOUNTER — Ambulatory Visit: Payer: Self-pay | Admitting: Physical Therapy

## 2024-06-28 NOTE — Therapy (Deleted)
 OUTPATIENT PHYSICAL THERAPY TREATMENT   Patient Name: Christy [name] MRN: [id number] DOB:11/09/1980, 43 y.o., female Today's Date: 06/28/2024  END OF SESSION:      Past Medical History:  Diagnosis Date   Anemia    Preeclampsia, severe 03/29/2016   Past Surgical History:  Procedure Laterality Date   NO PAST SURGERIES     Patient Active Problem List   Diagnosis Date Noted   Dysuria 06/07/2024   Encounter for IUD removal 06/07/2024   IUD (intrauterine device) in place 10/31/2017   SVD (spontaneous vaginal delivery) 09/22/2017   Noncompliance 07/26/2017   Anemia complicating pregnancy 07/14/2017   Supervision of other normal pregnancy, antepartum 05/01/2017   Encounter for supervision of multigravida with advanced maternal age 33/03/2016    PCP: Patient, No Pcp Per  REFERRING PROVIDER: Irving Delinda ORN, MD  REFERRING DIAG: [phone] (ICD-10-CM) - Sprain of left foot, initial encounter  THERAPY DIAG:  No diagnosis found.  Rationale for Evaluation and Treatment: Rehabilitation  ONSET DATE: 05/05/24  SUBJECTIVE:   SUBJECTIVE STATEMENT: *** Has been walking more. Able to stand longer. Worst pain level up to 6.   From eval: Pt reports she had a fall on ~1 month ago. Pt states she was walking out of a store and hit a machine that was left by the door with her foot and fell on her knee. Pt states she twisted her R knee and L foot. Pt reports standing on her L foot causes pain and throbbing. Unable to stand longer than 10 minutes. Pt was given an ankle brace but sometimes it will make her foot hurt a little bit (thinks it may be too tight). Reports some mild swelling. Pt states she was walking and running every day but no longer able to walk as much. Cannot tolerate any running now.   PERTINENT HISTORY: none  PAIN:  Are you having pain? Yes: NPRS scale: 0 at rest, at worst 8/10 Pain location: Whole Bottom of L foot (primarily laterally along pinky toe) to front of her  shin Pain description: Aching Aggravating factors: prolonged standing/weight bearing Relieving factors: Elevating  PRECAUTIONS: None  RED FLAGS: None   WEIGHT BEARING RESTRICTIONS: No  FALLS:  Has patient fallen in last 6 months? Yes. 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  DIAGNOSTIC FINDINGS: Unable to see x-ray for L foot/ankle on Epic  PATIENT SURVEYS:  LEFS  Extreme difficulty/unable (0), Quite a bit of difficulty (1), Moderate difficulty (2), Little difficulty (3), No difficulty (4) Survey date:  05/31/24  Any of your usual work, housework or school activities 1  2. Usual hobbies, recreational or sporting activities 1  3. Getting into/out of the bath 3  4. Walking between rooms 3  5. Putting on socks/shoes 4  6. Squatting  1  7. Lifting an object, like a bag of groceries from the floor 3  8. Performing light activities around your home 2  9. Performing heavy activities around your home 1  10. Getting into/out of a car 2  11. Walking 2 blocks 1  12.  Walking 1 mile 0  13. Going up/down 10 stairs (1 flight) 1  14. Standing for 1 hour 0  15.  sitting for 1 hour 2  16. Running on even ground 0  17. Running on uneven ground 0  18. Making sharp turns while running fast 0  19. Hopping  0  20. Rolling over in bed 4  Score total:  29/80      SENSATION: WFL currently Gets N/T in foot when driving and sleeping  EDEMA:  Mild edema  PALPATION: TTP along peroneals -- especially insertion to plantar surface of L  foot, ant tib, and along 5th metatarsal (lateral and plantar surface)  LOWER EXTREMITY ROM:  Active ROM Right eval Left eval  Hip flexion    Hip extension    Hip abduction    Hip adduction    Hip internal rotation    Hip external rotation    Knee flexion    Knee extension    Ankle dorsiflexion 20 15 tense  Ankle plantarflexion 45 45  Ankle inversion 15 20  Ankle eversion 15 15   (Blank rows = not tested)  LOWER EXTREMITY MMT:  MMT Right eval Left eval  Hip flexion 4 4  Hip extension    Hip abduction    Hip adduction    Hip internal rotation    Hip external rotation    Knee flexion 4 3+  Knee extension 4 4  Ankle dorsiflexion 4 3+  Ankle plantarflexion 4 3+ (unable to perform single leg heel raise in standing)  Ankle inversion 4 3+ pain  Ankle eversion 4 4   (Blank rows = not tested)  LOWER EXTREMITY SPECIAL TESTS:  Did not assess  FUNCTIONAL TESTS:  SLS: Unable to perform on L Single leg heel raise: unable on L  GAIT: Distance walked: Into clinic Assistive device utilized: None Level of assistance: Complete Independence Comments: Mildly antalgic with L LE weight bearing                                                                                                                                TREATMENT DATE:  06/28/24 Nustep L6 x 8 min LEs only Standing gastroc stretch on slant board x 30 Standing soleus stretch on slant board x 30 Standing eccentric heel raise 2x8 Standing double leg heel raise with ball squeeze 2x10 Tandem stance x30 Single leg clock reach x10 Seated ankle eversion green TB 3x10 Seated ankle inversion green TB 3x10   06/21/24 Nustep L6 x 8 min LEs only Standing gastroc stretch on slant board x 30 Standing soleus stretch on slant board x 30 Standing eccentric heel raise 2x8 Standing double leg heel raise with ball squeeze 2x10 Tandem stance x30 Single leg clock reach x10 Seated ankle eversion green TB 3x10 Seated  ankle inversion green TB 3x10   06/07/24 Nustep L3 x 6 min LEs only Standing gastroc stretch x 30 Standing soleus stretch  x 30 Towel scrunch x 30 Marble pick up 2x10 Seated ankle eversion red TB 3x10 Seated ankle inversion red TB 3x10 Standing double leg heel raise with ball squeeze 2x10 Standing double leg heel raise with toes turned out 2x10 Tandem stance 2x20  05/31/24 See HEP below    PATIENT EDUCATION:  Education details: Exam findings, POC, initial HEP Person educated: Patient Education method: Explanation, Demonstration, and Handouts Education comprehension: verbalized understanding, returned demonstration, and needs further education  HOME EXERCISE PROGRAM: Access Code: [email or web address] URL: [email or web address] Date: 06/21/2024 Prepared by: Jabarie Pop April Earnie Starring  Exercises - Seated Ankle Eversion with Resistance  - 1 x daily - 7 x weekly - 3 sets - 10 reps - Seated Ankle Inversion with Resistance  - 1 x daily - 7 x weekly - 3 sets - 10 reps - Standing Calf Raise With Small Ball at Heels  - 1 x daily - 7 x weekly - 2 sets - 10 reps - Gastroc Stretch on Wall  - 1 x daily - 7 x weekly - 2 sets - 30 sec hold - Soleus Stretch on Wall  - 1 x daily - 7 x weekly - 2 sets - 30 sec hold - Standing Eccentric Heel Raise  - 1 x daily - 7 x weekly - 2 sets - 10 reps - Single Leg Balance with Clock Reach  - 1 x daily - 7 x weekly - 1 sets - 10 reps  ASSESSMENT:  CLINICAL IMPRESSION: *** Pt continues to progress in her ankle strength and endurance. Able to perform L single leg heel raise in partial range. Working on improving single leg heel raise to be equal to R to slowly progress towards push off for running/plyometric activities. Progressed stabilizing exercises into more time in single limb stability.   From eval: Patient is a 43 y.o. F who was seen today for physical therapy evaluation and treatment for L ankle sprain/foot pain. Pt had a fall ~1 month ago  twisting her R knee and L foot/ankle. R knee pain is slowly decreasing; however, pt with great difficulty tolerating L LE weight bearing. Assessment is significant for full ankle AROM but most painful and weak with ankle inversion/DF and PF. Pt is tender along her L peroneals and tendinous insertion to the plantar surface of her 5th metatarsal. Pt will benefit from PT to improve on these deficits for return to pt's PLOF.   OBJECTIVE IMPAIRMENTS: Abnormal gait, decreased activity tolerance, decreased balance, decreased endurance, decreased strength, increased fascial restrictions, increased muscle spasms, impaired sensation, postural dysfunction, and pain.   ACTIVITY LIMITATIONS: standing, squatting, stairs, and locomotion level  PARTICIPATION LIMITATIONS: driving, shopping, community activity, and occupation  PERSONAL FACTORS: Age, Fitness, Past/current experiences, and Time since onset of injury/illness/exacerbation are also affecting patient's functional outcome.   REHAB POTENTIAL: Good  CLINICAL DECISION MAKING: Evolving/moderate complexity  EVALUATION COMPLEXITY: Moderate   GOALS: Goals reviewed with patient? Yes  SHORT TERM GOALS: Target date: 06/28/2024  Pt will be ind with initial HEP Baseline: Goal status: MET  2.  Pt will be able to perform single leg heel raise without any pain or issues on L Baseline:  06/21/24: 50% of R LE's heel raise height but no pain Goal status: IN PROGRESS  3.  Pt will be able to tolerate standing at least 15 min to fold laundry/wash dishes Baseline:  Goal status: INITIAL    LONG TERM GOALS: Target date: 07/26/2024   Pt will be ind with management and progression  of HEP Baseline:  Goal status: INITIAL  2.  Pt will have improved LEFS to >/=38/80 to demo MCID Baseline:  Goal status: INITIAL  3.  Pt will be able to perform SLS on L for at least 20 sec to demo increased weight bearing tolerance/ankle stability Baseline:  Goal status:  INITIAL  4.  Pt will report improved overall pain by >/=75% Baseline:  Goal status: INITIAL    PLAN:  PT FREQUENCY: 1-2x/week  PT DURATION: 8 weeks  PLANNED INTERVENTIONS: 97164- PT Re-evaluation, 97750- Physical Performance Testing, 97110-Therapeutic exercises, 97530- Therapeutic activity, [id number]- Neuromuscular re-education, 97535- Self Care, 02859- Manual therapy, [id number]- Gait training, [phone]- Electrical stimulation (unattended), 97016- Vasopneumatic device, [id number]- Ultrasound, [id number]- Ionotophoresis 4mg /ml Dexamethasone , 79439 (1-2 muscles), 20561 (3+ muscles)- Dry Needling, Patient/Family education, Balance training, Stair training, Taping, Joint mobilization, Cryotherapy, and Moist heat  PLAN FOR NEXT SESSION: Assess response to HEP. Work on ankle stability within pain free range. Foot strengthening. Single limb strength/stability on L. Improving ankle strength for push off to progress towards running as tolerated.    Bogdan Vivona April Ma L Pryce Folts, PT, DPT 06/28/2024, 7:54 AM

## 2024-07-11 ENCOUNTER — Ambulatory Visit: Payer: Self-pay

## 2024-07-11 DIAGNOSIS — M25572 Pain in left ankle and joints of left foot: Secondary | ICD-10-CM | POA: Insufficient documentation

## 2024-07-11 DIAGNOSIS — M6281 Muscle weakness (generalized): Secondary | ICD-10-CM | POA: Insufficient documentation

## 2024-07-11 DIAGNOSIS — R262 Difficulty in walking, not elsewhere classified: Secondary | ICD-10-CM | POA: Insufficient documentation

## 2024-07-11 NOTE — Therapy (Signed)
 OUTPATIENT PHYSICAL THERAPY TREATMENT   Patient Name: Christy [name] MRN: [id number] DOB:1980-11-25, 43 y.o., female Today's Date: 07/11/2024  END OF SESSION:  PT End of Session - 07/11/24 1525     Visit Number 4    Number of Visits 12    Date for Recertification  07/26/24    Authorization Type [employer]    PT Start Time 1500   pt late   PT Stop Time 1530    PT Time Calculation (min) 30 min    Activity Tolerance Patient tolerated treatment well             Past Medical History:  Diagnosis Date   Anemia    Preeclampsia, severe 03/29/2016   Past Surgical History:  Procedure Laterality Date   NO PAST SURGERIES     Patient Active Problem List   Diagnosis Date Noted   Dysuria 06/07/2024   Encounter for IUD removal 06/07/2024   IUD (intrauterine device) in place 10/31/2017   SVD (spontaneous vaginal delivery) 09/22/2017   Noncompliance 07/26/2017   Anemia complicating pregnancy 07/14/2017   Supervision of other normal pregnancy, antepartum 05/01/2017   Encounter for supervision of multigravida with advanced maternal age 43/03/2016    PCP: Patient, No Pcp Per  REFERRING PROVIDER: Irving Delinda ORN, MD  REFERRING DIAG: S93.602A (ICD-10-CM) - Sprain of left foot, initial encounter  THERAPY DIAG:  Pain in left ankle and joints of left foot  Muscle weakness (generalized)  Rationale for Evaluation and Treatment: Rehabilitation  ONSET DATE: 05/05/24  SUBJECTIVE:   SUBJECTIVE STATEMENT: LLE 7/10 pain. HEP has been going consistently. Was trying to do fast walking and now L foot/calf is hurting.  From eval: Pt reports she had a fall on ~1 month ago. Pt states she was walking out of a store and hit a machine that was left by the door with her foot and fell on her knee. Pt states she twisted her R knee and L foot. Pt reports standing on her L foot causes pain and throbbing. Unable to stand longer than 10 minutes. Pt was given an ankle brace but sometimes it will  make her foot hurt a little bit (thinks it may be too tight). Reports some mild swelling. Pt states she was walking and running every day but no longer able to walk as much. Cannot tolerate any running now.   PERTINENT HISTORY: none  PAIN:  Are you having pain? Yes: NPRS scale: 0 at rest, at worst 8/10 Pain location: Whole Bottom of L foot (primarily laterally along pinky toe) to front of her shin Pain description: Aching Aggravating factors: prolonged standing/weight bearing Relieving factors: Elevating  PRECAUTIONS: None  RED FLAGS: None   WEIGHT BEARING RESTRICTIONS: No  FALLS:  Has patient fallen in last 6 months? Yes. 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  DIAGNOSTIC FINDINGS: Unable to see x-ray for L foot/ankle on Epic  PATIENT SURVEYS:  LEFS  Extreme difficulty/unable (0), Quite a bit of difficulty (1), Moderate difficulty (2), Little difficulty (3), No difficulty (4) Survey date:  05/31/24  Any of your usual work, housework or school activities 1  2. Usual hobbies, recreational or sporting activities 1  3. Getting into/out of the bath 3  4. Walking between rooms 3  5. Putting on socks/shoes 4  6. Squatting  1  7. Lifting an object, like a bag of groceries from the floor 3  8. Performing light activities around your home 2  9. Performing heavy activities around your home 1  10. Getting into/out of a car 2  11. Walking 2 blocks 1  12. Walking 1 mile 0  13. Going up/down 10 stairs (1 flight) 1  14. Standing  for 1 hour 0  15.  sitting for 1 hour 2  16. Running on even ground 0  17. Running on uneven ground 0  18. Making sharp turns while running fast 0  19. Hopping  0  20. Rolling over in bed 4  Score total:  29/80      SENSATION: WFL currently Gets N/T in foot when driving and sleeping  EDEMA:  Mild edema  PALPATION: TTP along peroneals -- especially insertion to plantar surface of L foot, ant tib, and along 5th metatarsal (lateral and plantar surface)  LOWER EXTREMITY ROM:  Active ROM Right eval Left eval  Hip flexion    Hip extension    Hip abduction    Hip adduction    Hip internal rotation    Hip external rotation    Knee flexion    Knee extension    Ankle dorsiflexion 20 15 tense  Ankle plantarflexion 45 45  Ankle inversion 15 20  Ankle eversion 15 15   (Blank rows = not tested)  LOWER EXTREMITY MMT:  MMT Right eval Left eval  Hip flexion 4 4  Hip extension    Hip abduction    Hip adduction    Hip internal rotation    Hip external rotation    Knee flexion 4 3+  Knee extension 4 4  Ankle dorsiflexion 4 3+  Ankle plantarflexion 4 3+ (unable to perform single leg heel raise in standing)  Ankle inversion 4 3+ pain  Ankle eversion 4 4   (Blank rows = not tested)  LOWER EXTREMITY SPECIAL TESTS:  Did not assess  FUNCTIONAL TESTS:  SLS: Unable to perform on L Single leg heel raise: unable on L  GAIT: Distance walked: Into clinic Assistive device utilized: None Level of assistance: Complete Independence Comments: Mildly antalgic with L LE weight bearing                                                                                                                                TREATMENT DATE:  07/11/24 Neuromuscular Reeducation: HEP reassessment and update Red TB PF 2x8x3s Red circles Cw/ccw 2x6x3s    Manual Therapy: STM to left calf     06/21/24 Nustep L6 x 8 min LEs only Standing gastroc stretch on slant board x 30 Standing  soleus stretch on slant board x 30 Standing eccentric heel raise 2x8 Standing double leg heel raise with ball squeeze 2x10 Tandem stance x30 Single leg clock reach x10 Seated ankle eversion green TB 3x10 Seated ankle inversion green TB 3x10   06/07/24 Nustep L3 x 6 min LEs only Standing gastroc stretch x 30 Standing soleus stretch x 30 Towel scrunch x 30 Marble pick up 2x10 Seated ankle eversion red TB 3x10 Seated ankle inversion red TB 3x10 Standing double leg heel raise with ball squeeze 2x10 Standing double leg heel raise with toes turned out 2x10 Tandem stance 2x20  05/31/24 See HEP below    PATIENT EDUCATION:  Education details: Exam findings, POC, initial HEP Person educated: Patient Education method: Explanation, Demonstration, and Handouts Education comprehension: verbalized understanding, returned demonstration, and needs further education  HOME EXERCISE PROGRAM: Access Code: [email or web address] URL: [email or web address] Date: 06/21/2024 Prepared by: Gellen April Earnie Starring  Exercises - Seated Ankle Eversion with Resistance  - 1 x daily - 7 x weekly - 3 sets - 10 reps - Seated Ankle Inversion with Resistance  - 1 x daily - 7 x weekly - 3 sets - 10 reps - Standing Calf Raise With Small Ball at Heels  - 1 x daily - 7 x weekly - 2 sets - 10 reps - Gastroc Stretch on Wall  - 1 x daily - 7 x weekly - 2 sets - 30 sec hold - Soleus Stretch on Wall  - 1 x daily - 7 x weekly - 2 sets - 30 sec hold - Standing Eccentric Heel Raise  - 1 x daily - 7 x weekly - 2 sets - 10 reps - Single Leg Balance with Clock Reach  - 1 x daily - 7 x weekly - 1 sets - 10 reps  ASSESSMENT:  CLINICAL IMPRESSION: Patient tolerated treatment with minimal increases in pain with progressions in LLE sx modulation and HEP adjustment. Current deficits include: excessive pain, functional activity tolerance, strength. As a result, patient would continue to benefit from skilled PT to  address said deficits via plan below.    From eval: Patient is a 43 y.o. F who was seen today for physical therapy evaluation and treatment for L ankle sprain/foot pain. Pt had a fall ~1 month ago twisting her R knee and L foot/ankle. R knee pain is slowly decreasing; however, pt with great difficulty tolerating L LE weight bearing. Assessment is significant for full ankle AROM but most painful and weak with ankle inversion/DF and PF. Pt is tender along her L peroneals and tendinous insertion to the plantar surface of her 5th metatarsal. Pt will benefit from PT to improve on these deficits for return to pt's PLOF.   OBJECTIVE IMPAIRMENTS: Abnormal gait, decreased activity tolerance, decreased balance, decreased endurance, decreased strength, increased fascial restrictions, increased muscle spasms, impaired sensation, postural dysfunction, and pain.   ACTIVITY LIMITATIONS: standing, squatting, stairs, and locomotion level  PARTICIPATION LIMITATIONS: driving, shopping, community activity, and occupation  PERSONAL FACTORS: Age, Fitness, Past/current experiences, and Time since onset of injury/illness/exacerbation are also affecting patient's functional outcome.   REHAB POTENTIAL: Good  CLINICAL DECISION MAKING: Evolving/moderate complexity  EVALUATION COMPLEXITY: Moderate   GOALS: Goals reviewed with patient? Yes  SHORT TERM GOALS: Target date: 06/28/2024  Pt will be ind with initial HEP Baseline: Goal status: MET  2.  Pt will be able to perform single leg heel raise without any pain or issues on L Baseline:  06/21/24: 50% of R LE's heel  raise height but no pain Goal status: IN PROGRESS  3.  Pt will be able to tolerate standing at least 15 min to fold laundry/wash dishes Baseline:  Goal status: INITIAL    LONG TERM GOALS: Target date: 07/26/2024   Pt will be ind with management and progression of HEP Baseline:  Goal status: INITIAL  2.  Pt will have improved LEFS to  >/=38/80 to demo MCID Baseline:  Goal status: INITIAL  3.  Pt will be able to perform SLS on L for at least 20 sec to demo increased weight bearing tolerance/ankle stability Baseline:  Goal status: INITIAL  4.  Pt will report improved overall pain by >/=75% Baseline:  Goal status: INITIAL    PLAN:  PT FREQUENCY: 1-2x/week  PT DURATION: 8 weeks  PLANNED INTERVENTIONS: 97164- PT Re-evaluation, 97750- Physical Performance Testing, 97110-Therapeutic exercises, 97530- Therapeutic activity, [id number]- Neuromuscular re-education, 97535- Self Care, 02859- Manual therapy, [id number]- Gait training, [phone]- Electrical stimulation (unattended), 97016- Vasopneumatic device, [id number]- Ultrasound, [id number]- Ionotophoresis 4mg /ml Dexamethasone , 79439 (1-2 muscles), 20561 (3+ muscles)- Dry Needling, Patient/Family education, Balance training, Stair training, Taping, Joint mobilization, Cryotherapy, and Moist heat  PLAN FOR NEXT SESSION: Assess response to HEP. Work on ankle stability within pain free range. Foot strengthening. Single limb strength/stability on L. Improving ankle strength for push off to progress towards running as tolerated.    [place] Odessia Scot, PT, DPT 07/11/2024, 10:54 PM

## 2024-07-12 ENCOUNTER — Other Ambulatory Visit: Payer: Self-pay

## 2024-07-12 ENCOUNTER — Emergency Department (HOSPITAL_BASED_OUTPATIENT_CLINIC_OR_DEPARTMENT_OTHER)
Admission: EM | Admit: 2024-07-12 | Discharge: 2024-07-12 | Disposition: A | Discharge status: Home / Self Care | Attending: Emergency Medicine | Admitting: Emergency Medicine

## 2024-07-12 ENCOUNTER — Encounter (HOSPITAL_BASED_OUTPATIENT_CLINIC_OR_DEPARTMENT_OTHER): Payer: Self-pay

## 2024-07-12 DIAGNOSIS — K2900 Acute gastritis without bleeding: Secondary | ICD-10-CM | POA: Insufficient documentation

## 2024-07-12 DIAGNOSIS — Z91040 Latex allergy status: Secondary | ICD-10-CM | POA: Insufficient documentation

## 2024-07-12 LAB — CBC WITH DIFFERENTIAL/PLATELET
Abs Immature Granulocytes: 0.02 K/uL (ref 0.00–0.07)
Basophils Absolute: 0.1 K/uL (ref 0.0–0.1)
Basophils Relative: 1 %
Eosinophils Absolute: 0.2 K/uL (ref 0.0–0.5)
Eosinophils Relative: 3 %
HCT: 37.3 % (ref 36.0–46.0)
Hemoglobin: 12.2 g/dL (ref 12.0–15.0)
Immature Granulocytes: 0 %
Lymphocytes Relative: 40 %
Lymphs Abs: 2.3 K/uL (ref 0.7–4.0)
MCH: 29 pg (ref 26.0–34.0)
MCHC: 32.7 g/dL (ref 30.0–36.0)
MCV: 88.6 fL (ref 80.0–100.0)
Monocytes Absolute: 0.5 K/uL (ref 0.1–1.0)
Monocytes Relative: 9 %
Neutro Abs: 2.7 K/uL (ref 1.7–7.7)
Neutrophils Relative %: 47 %
Platelets: 297 K/uL (ref 150–400)
RBC: 4.21 MIL/uL (ref 3.87–5.11)
RDW: 12.8 % (ref 11.5–15.5)
WBC: 5.8 K/uL (ref 4.0–10.5)
nRBC: 0 % (ref 0.0–0.2)

## 2024-07-12 LAB — COMPREHENSIVE METABOLIC PANEL WITH GFR
ALT: 42 U/L (ref 0–44)
AST: 63 U/L — ABNORMAL HIGH (ref 15–41)
Albumin: 4.4 g/dL (ref 3.5–5.0)
Alkaline Phosphatase: 114 U/L (ref 38–126)
Anion gap: 10 (ref 5–15)
BUN: 6 mg/dL (ref 6–20)
CO2: 26 mmol/L (ref 22–32)
Calcium: 9.1 mg/dL (ref 8.9–10.3)
Chloride: 101 mmol/L (ref 98–111)
Creatinine, Ser: 0.89 mg/dL (ref 0.44–1.00)
GFR, Estimated: >60 mL/min (ref >60)
Glucose, Bld: 89 mg/dL (ref 70–99)
Potassium: 3.7 mmol/L (ref 3.5–5.1)
Sodium: 137 mmol/L (ref 135–145)
Total Bilirubin: 0.3 mg/dL (ref 0.0–1.2)
Total Protein: 8 g/dL (ref 6.5–8.1)

## 2024-07-12 LAB — TROPONIN T, HIGH SENSITIVITY: Troponin T High Sensitivity: <15 ng/L (ref 0–19)

## 2024-07-12 LAB — LIPASE, BLOOD: Lipase: 56 U/L — ABNORMAL HIGH (ref 11–51)

## 2024-07-12 MED ORDER — HYOSCYAMINE SULFATE 0.125 MG SL SUBL
0.2500 mg | SUBLINGUAL_TABLET | Freq: Once | SUBLINGUAL | Status: AC
  Administered 2024-07-12: 0.25 mg via SUBLINGUAL
  Filled 2024-07-12: qty 2

## 2024-07-12 MED ORDER — PANTOPRAZOLE SODIUM 40 MG PO TBEC: 40.0000 mg | DELAYED_RELEASE_TABLET | Freq: Every day | ORAL | 3 refills | Status: AC

## 2024-07-12 MED ORDER — SUCRALFATE 1 G PO TABS: 1.0000 g | ORAL_TABLET | Freq: Three times a day (TID) | ORAL | 0 refills | Status: AC

## 2024-07-12 MED ORDER — LIDOCAINE VISCOUS HCL 2 % MT SOLN
15.0000 mL | Freq: Once | OROMUCOSAL | Status: AC
  Administered 2024-07-12: 15 mL via OROMUCOSAL
  Filled 2024-07-12: qty 15

## 2024-07-12 MED ORDER — ALUM & MAG HYDROXIDE-SIMETH 200-200-20 MG/5ML PO SUSP
30.0000 mL | Freq: Once | ORAL | Status: AC
  Administered 2024-07-12: 30 mL via ORAL
  Filled 2024-07-12: qty 30

## 2024-07-12 MED ORDER — FAMOTIDINE 20 MG PO TABS
40.0000 mg | ORAL_TABLET | Freq: Once | ORAL | Status: AC
  Administered 2024-07-12: 40 mg via ORAL
  Filled 2024-07-12: qty 2

## 2024-07-12 NOTE — ED Notes (Signed)
 PT called out with use of call bell to notify staff that her upper abdominal/epigastric pain has improved after medication.

## 2024-07-12 NOTE — ED Notes (Signed)
 ED Provider at bedside.

## 2024-07-12 NOTE — ED Notes (Addendum)
 Patient transferred from waiting room to ED treatment room. Assuming pt care at this time.

## 2024-07-12 NOTE — ED Provider Notes (Signed)
 [place] [facility] AT [facility] Provider Note   CSN: [id number] Arrival date & time: 07/12/24  9982     Patient presents with: Chest Pain   Christy [name] is a 43 y.o. female.   Patient presents to the emergency department for evaluation of left upper abdominal pain.  Patient reports that symptoms began more than a week.  Patient reports that she has had similar symptoms in the past with bacterial infection that she got visiting [place].  Patient denies nausea, vomiting, diarrhea, hematemesis, rectal bleeding, melena.       Prior to Admission medications   Medication Sig Start Date End Date Taking? Authorizing Provider  pantoprazole  (PROTONIX ) 40 MG tablet Take 1 tablet (40 mg total) by mouth daily. 07/12/24  Yes Derrion Tritz, Lonni PARAS, MD  sucralfate  (CARAFATE ) 1 g tablet Take 1 tablet (1 g total) by mouth 4 (four) times daily -  with meals and at bedtime. 07/12/24  Yes Asmar Brozek, Lonni PARAS, MD  amLODipine  (NORVASC ) 2.5 MG tablet Take 1 tablet (2.5 mg total) by mouth daily. 04/11/24   Katherina Wimer [place], PA-C  Ascorbic Acid (VITAMIN C PO) Take by mouth.    [provider]  levonorgestrel  (MIRENA , 52 MG,) 20 MCG/24HR IUD Mirena  20 mcg/24 hours (6 yrs) 52 mg intrauterine device  Take by intrauterine route.    [provider]  phenazopyridine  (PYRIDIUM ) 200 MG tablet Take 1 tablet (200 mg total) by mouth 3 (three) times daily. 05/04/24   Barrett, Jamie N, PA-C    Allergies: Diflucan [fluconazole], Sulfamethoxazole-trimethoprim, Latex, and Metronidazole     Review of Systems  Updated Vital Signs BP (!) 158/96 (BP Location: Right Arm)   Pulse 73   Temp 98.7 F (37.1 C) (Oral)   Resp 19   Ht 5' 4 (1.626 m)   Wt 86.2 kg   SpO2 99%   BMI 32.61 kg/m   Physical Exam Vitals and nursing note reviewed.  Constitutional:      General: She is not in acute distress.    Appearance: She is well-developed.  HENT:     Head: Normocephalic and  atraumatic.     Mouth/Throat:     Mouth: Mucous membranes are moist.  Eyes:     General: Vision grossly intact. Gaze aligned appropriately.     Extraocular Movements: Extraocular movements intact.     Conjunctiva/sclera: Conjunctivae normal.  Cardiovascular:     Rate and Rhythm: Normal rate and regular rhythm.     Pulses: Normal pulses.     Heart sounds: Normal heart sounds, S1 normal and S2 normal. No murmur heard.    No friction rub. No gallop.  Pulmonary:     Effort: Pulmonary effort is normal. No respiratory distress.     Breath sounds: Normal breath sounds.  Abdominal:     General: Bowel sounds are normal.     Palpations: Abdomen is soft.     Tenderness: There is abdominal tenderness in the left upper quadrant. There is no guarding or rebound.     Hernia: No hernia is present.  Musculoskeletal:        General: No swelling.     Cervical back: Full passive range of motion without pain, normal range of motion and neck supple. No spinous process tenderness or muscular tenderness. Normal range of motion.     Right lower leg: No edema.     Left lower leg: No edema.  Skin:    General: Skin is warm and dry.  Capillary Refill: Capillary refill takes less than 2 seconds.     Findings: No ecchymosis, erythema, rash or wound.  Neurological:     General: No focal deficit present.     Mental Status: She is alert and oriented to person, place, and time.     GCS: GCS eye subscore is 4. GCS verbal subscore is 5. GCS motor subscore is 6.     Cranial Nerves: Cranial nerves 2-12 are intact.     Sensory: Sensation is intact.     Motor: Motor function is intact.     Coordination: Coordination is intact.  Psychiatric:        Attention and Perception: Attention normal.        Mood and Affect: Mood normal.        Speech: Speech normal.        Behavior: Behavior normal.     (all labs ordered are listed, but only abnormal results are displayed) Labs Reviewed  COMPREHENSIVE METABOLIC PANEL  WITH GFR - Abnormal; Notable for the following components:      Result Value   AST 63 (*)    All other components within normal limits  LIPASE, BLOOD - Abnormal; Notable for the following components:   Lipase 56 (*)    All other components within normal limits  CBC WITH DIFFERENTIAL/PLATELET  TROPONIN T, HIGH SENSITIVITY    EKG: EKG Interpretation Date/Time:  Friday July 12 2024 00:26:31 EST Ventricular Rate:  70 PR Interval:  173 QRS Duration:  93 QT Interval:  416 QTC Calculation: 449 R Axis:   -1  Text Interpretation: Sinus rhythm Low voltage, precordial leads Consider anterior infarct No significant change since last tracing Confirmed by Haze Lonni PARAS [phone]) on 07/12/2024 12:29:32 AM  Radiology: No results found.   Procedures   Medications Ordered in the ED  alum & mag hydroxide-simeth (MAALOX/MYLANTA) 200-200-20 MG/5ML suspension 30 mL (30 mLs Oral Given 07/12/24 0047)  hyoscyamine  (LEVSIN  SL) SL tablet 0.25 mg (0.25 mg Sublingual Given 07/12/24 0047)  lidocaine  (XYLOCAINE ) 2 % viscous mouth solution 15 mL (15 mLs Mouth/Throat Given 07/12/24 0047)  famotidine  (PEPCID ) tablet 40 mg (40 mg Oral Given 07/12/24 0047)                                    Medical Decision Making Amount and/or Complexity of Data Reviewed Labs: ordered.  Risk OTC drugs. Prescription drug management.   Differential Diagnosis considered includes, but not limited to: Cholelithiasis; cholecystitis; cholangitis; bowel obstruction; esophagitis; gastritis; peptic ulcer disease; pancreatitis; cardiac.  Patient with pain for more than a week continuously.  Does not seem consistent with cardiac etiology.  EKG, troponin negative.  Patient reports that she was treated with antibiotic when she has had similar problems in the past.  She is unfamiliar with the term H. pylori and has never seen a GI doctor.  She reports that she was treated by urgent care and her OB/GYN for similar symptoms  in the past.  Thorough chart review reveals no prescriptions for antibiotics, she has been treated for GERD and reflux multiple times in the past.  Patient's lab work is reassuring.  No anemia.  LFTs normal.  Lipase 56, not consistent with acute pancreatitis.  Patient administered multiple antacids here in the emergency department and reports symptoms have improved.  Patient will need to follow-up with gastroenterology for formal diagnosis.  Will treat for gastritis.  Final diagnoses:  Acute gastritis without hemorrhage, unspecified gastritis type    ED Discharge Orders          Ordered    pantoprazole  (PROTONIX ) 40 MG tablet  Daily        07/12/24 0111    sucralfate  (CARAFATE ) 1 g tablet  3 times daily with meals & bedtime        07/12/24 0111               Haze Lonni PARAS, MD 07/12/24 [phone]

## 2024-07-12 NOTE — ED Triage Notes (Addendum)
 Complaining of pain in the left side of the chest at the level of the diaphragm. Said the pain is a dull ache that started after thanksgiving. Today the pain started going through to the left side of the back.  She said she felt this way every time she went to [place] and got a bacterial infection.

## 2024-07-25 ENCOUNTER — Ambulatory Visit: Payer: Self-pay

## 2024-07-25 DIAGNOSIS — R262 Difficulty in walking, not elsewhere classified: Secondary | ICD-10-CM

## 2024-07-25 DIAGNOSIS — M25572 Pain in left ankle and joints of left foot: Secondary | ICD-10-CM

## 2024-07-25 DIAGNOSIS — M6281 Muscle weakness (generalized): Secondary | ICD-10-CM

## 2024-07-25 NOTE — Therapy (Signed)
 OUTPATIENT PHYSICAL THERAPY TREATMENT  Progress Note Reporting Period 05/31/24 to 07/25/24  See note below for Objective Data and Assessment of Progress/Goals.    Patient Name: Christy [name] MRN: [id number] DOB:09/24/1980, 43 y.o., female Today's Date: 07/25/2024  END OF SESSION:  PT End of Session - 07/25/24 1056     Visit Number 3    Number of Visits 12    Date for Recertification  09/27/24    Authorization Type [employer]    PT Start Time 0940   pt late   PT Stop Time 1015    PT Time Calculation (min) 35 min    Activity Tolerance Patient tolerated treatment well             Past Medical History:  Diagnosis Date   Anemia    Preeclampsia, severe 03/29/2016   Past Surgical History:  Procedure Laterality Date   NO PAST SURGERIES     Patient Active Problem List   Diagnosis Date Noted   Dysuria 06/07/2024   Encounter for IUD removal 06/07/2024   IUD (intrauterine device) in place 10/31/2017   SVD (spontaneous vaginal delivery) 09/22/2017   Noncompliance 07/26/2017   Anemia complicating pregnancy 07/14/2017   Supervision of other normal pregnancy, antepartum 05/01/2017   Encounter for supervision of multigravida with advanced maternal age 51/03/2016    PCP: Patient, No Pcp Per  REFERRING PROVIDER: Irving Delinda ORN, MD  REFERRING DIAG: S93.602A (ICD-10-CM) - Sprain of left foot, initial encounter  THERAPY DIAG:  Pain in left ankle and joints of left foot  Difficulty in walking, not elsewhere classified  Muscle weakness (generalized)  Rationale for Evaluation and Treatment: Rehabilitation  ONSET DATE: 05/05/24  SUBJECTIVE:   SUBJECTIVE STATEMENT: LLE 6.5/10 pain after standing on it for too long. 3 with pain currently. HEP has been going consistently.   From eval: Pt reports she had a fall on ~1 month ago. Pt states she was walking out of a store and hit a machine that was left by the door with her foot and fell on her knee. Pt states she  twisted her R knee and L foot. Pt reports standing on her L foot causes pain and throbbing. Unable to stand longer than 10 minutes. Pt was given an ankle brace but sometimes it will make her foot hurt a little bit (thinks it may be too tight). Reports some mild swelling. Pt states she was walking and running every day but no longer able to walk as much. Cannot tolerate any running now.   PERTINENT HISTORY: none  PAIN:  Are you having pain? Yes: NPRS scale: 0 at rest, at worst 8/10 Pain location: Whole Bottom of L foot (primarily laterally along pinky toe) to front of her shin Pain description: Aching Aggravating factors: prolonged standing/weight bearing Relieving factors: Elevating  PRECAUTIONS: None  RED FLAGS: None   WEIGHT BEARING RESTRICTIONS: No  FALLS:  Has patient fallen in last 6 months? Yes. 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  DIAGNOSTIC FINDINGS: Unable to see x-ray for L foot/ankle on Epic  PATIENT SURVEYS:  LEFS  Extreme difficulty/unable (0), Quite a bit of difficulty (1), Moderate difficulty (2), Little difficulty (3), No difficulty (4) Survey date:  05/31/24  Any of your usual work, housework or school activities 1  2. Usual hobbies, recreational or sporting activities 1  3. Getting into/out of the bath 3  4. Walking between rooms 3  5. Putting on socks/shoes 4  6. Squatting  1  7. Lifting an object, like a bag of groceries from the floor 3  8. Performing light activities around your home 2  9.  Performing heavy activities around your home 1  10. Getting into/out of a car 2  11. Walking 2 blocks 1  12. Walking 1 mile 0  13. Going up/down 10 stairs (1 flight) 1  14. Standing for 1 hour 0  15.  sitting for 1 hour 2  16. Running on even ground 0  17. Running on uneven ground 0  18. Making sharp turns while running fast 0  19. Hopping  0  20. Rolling over in bed 4  Score total:  29/80    07/25/24: 35/80   SENSATION: WFL currently Gets N/T in foot when driving and sleeping  87/81: occasionally only  EDEMA:  Mild edema  PALPATION: TTP along peroneals -- especially insertion to plantar surface of L foot, ant tib, and along 5th metatarsal (lateral and plantar surface)      LOWER EXTREMITY ROM:  Active ROM Right eval Left Eval (WFL as R 07/25/24)  Hip flexion    Hip extension    Hip abduction    Hip adduction    Hip internal rotation    Hip external rotation    Knee flexion    Knee extension    Ankle dorsiflexion 20 15 tense, 20   Ankle plantarflexion 45 45  Ankle inversion 15 20  Ankle eversion 15 15   (Blank rows = not tested)  LOWER EXTREMITY MMT:  MMT Right eval Left Eval (07/25/24)  Hip flexion 4 4  Hip extension    Hip abduction    Hip adduction    Hip internal rotation    Hip external rotation    Knee flexion 4 3+, 4-  Knee extension 4 4  Ankle dorsiflexion 4 3+, 4-  Ankle plantarflexion 4 3+ (unable to perform single leg heel raise in standing)  Ankle inversion 4 3+ pain, 4-   Ankle eversion 4 4   (Blank rows = not tested)  LOWER EXTREMITY SPECIAL TESTS:  Did not assess  FUNCTIONAL TESTS:  SLS: Unable to perform on L, appx 5 seconds (3/10 pain) Single leg heel raise: unable on L, L 75% of R (7/10 pain)  GAIT: Distance walked: Into clinic Assistive device utilized: None Level of assistance: Complete Independence Comments: Mildly antalgic with L LE weight bearing  TREATMENT DATE:  07/25/24 Therapeutic Activity: Functional reassessment and update of goals HEP reassessment and update     07/11/24 Neuromuscular Reeducation: HEP reassessment and update Red TB PF 2x8x3s Red circles Cw/ccw 2x6x3s    Manual Therapy: STM to left calf     06/21/24 Nustep L6 x 8 min LEs only Standing gastroc stretch on slant board x 30 Standing soleus stretch on slant board x 30 Standing eccentric heel raise 2x8 Standing double leg heel raise with ball squeeze 2x10 Tandem stance x30 Single leg clock reach x10 Seated ankle eversion green TB 3x10 Seated ankle inversion green TB 3x10   06/07/24 Nustep L3 x 6 min LEs only Standing gastroc stretch x 30 Standing soleus stretch x 30 Towel scrunch x 30 Marble pick up 2x10 Seated ankle eversion red TB 3x10 Seated ankle inversion red TB 3x10 Standing double leg heel raise with ball squeeze 2x10 Standing double leg heel raise with toes turned out 2x10 Tandem stance 2x20  05/31/24 See HEP below    PATIENT EDUCATION:  Education details: Exam findings, POC, initial HEP Person educated: Patient Education method: Explanation, Demonstration, and Handouts Education comprehension: verbalized understanding, returned demonstration, and needs further education  HOME EXERCISE PROGRAM: Access Code: [email or web address] URL: [email or web address] Date: 06/21/2024 Prepared by: Gellen April Earnie Starring  Exercises - Seated Ankle Eversion with Resistance  - 1 x daily - 7 x weekly - 3 sets - 10 reps - Seated Ankle Inversion with Resistance  - 1 x daily - 7 x weekly - 3 sets - 10 reps - Standing Calf Raise With Small Ball at Heels  - 1 x daily - 7 x weekly - 2 sets - 10 reps - Gastroc Stretch on Wall  - 1 x daily - 7 x weekly - 2 sets - 30 sec hold - Soleus Stretch on Wall  - 1 x daily - 7 x weekly - 2 sets - 30 sec hold - Standing Eccentric Heel  Raise  - 1 x daily - 7 x weekly - 2 sets - 10 reps - Single Leg Balance with Clock Reach  - 1 x daily - 7 x weekly - 1 sets - 10 reps  12/18 GTB PF w/ inv 2x6x3s GTB ev 2x6x3s Standing calf raise 2x4x3s  Walking/jogging as tolerated  ASSESSMENT:  CLINICAL IMPRESSION: Patient tolerated treatment with no significant increases in pain with progressions in HEP progression and programming. Current deficits include: excessive pain, standing tolerance, and functional strength. Patient has met most goals and is compliant with HEP. However, patient still has aforementioned deficits and cannot run. As a result, patient would continue to benefit from skilled PT to address said deficits via plan below.     From eval: Patient is a 43 y.o. F who was seen today for physical therapy evaluation and treatment for L ankle sprain/foot pain. Pt had a fall ~1 month ago twisting her R knee and L foot/ankle. R knee pain is slowly decreasing; however, pt with great difficulty tolerating L LE weight bearing. Assessment is significant for full ankle AROM but most painful and weak with ankle inversion/DF and PF. Pt is tender along her L peroneals and tendinous insertion to the plantar surface of her 5th metatarsal. Pt will benefit from PT to improve on these deficits for return to pt's PLOF.   OBJECTIVE IMPAIRMENTS: Abnormal gait, decreased activity tolerance, decreased balance, decreased endurance, decreased strength, increased fascial restrictions, increased muscle spasms, impaired sensation, postural dysfunction, and pain.   ACTIVITY LIMITATIONS: standing,  squatting, stairs, and locomotion level  PARTICIPATION LIMITATIONS: driving, shopping, community activity, and occupation  PERSONAL FACTORS: Age, Fitness, Past/current experiences, and Time since onset of injury/illness/exacerbation are also affecting patient's functional outcome.   REHAB POTENTIAL: Good  CLINICAL DECISION MAKING: Evolving/moderate  complexity  EVALUATION COMPLEXITY: Moderate   GOALS: Goals reviewed with patient? Yes  SHORT TERM GOALS: Target date: 06/28/2024  Pt will be ind with initial HEP Baseline: Goal status: MET  2.  Pt will be able to perform single leg heel raise without any pain or issues on L Baseline:  06/21/24: 50% of R LE's heel raise height but no pain Goal status: MET  3.  Pt will be able to tolerate standing at least 15 min to fold laundry/wash dishes Baseline:  07/25/24: 5 minutes (tried to start running progression too fast) Goal status: in progress    LONG TERM GOALS: Target date: 09/27/2024   Pt will be ind with management and progression of HEP Baseline:  Goal status: MET  2.  Pt will have improved LEFS to >/=38/80 to demo MCID Baseline:  Goal status: In progress  3.  Pt will be able to perform SLS on L for at least 20 sec to demo increased weight bearing tolerance/ankle stability Baseline:  12/18: ~7 seconds Goal status: ongoing  4.  Pt will report improved overall pain by >/=75% Baseline:  12/18: 60% Goal status: Ongoing    5. Patient will be able to run at least 75% original capacity to demonstrate improvements in functional activity tolerance, strength, balance, pain, and overall QOL    Baseline: 0%    Goal status: initial     PLAN:  PT FREQUENCY: 1-2x/week  PT DURATION: 8 weeks  PLANNED INTERVENTIONS: 97164- PT Re-evaluation, 97750- Physical Performance Testing, 97110-Therapeutic exercises, 97530- Therapeutic activity, [id number]- Neuromuscular re-education, 97535- Self Care, 02859- Manual therapy, [id number]- Gait training, [phone]- Electrical stimulation (unattended), 97016- Vasopneumatic device, [id number]- Ultrasound, [id number]- Ionotophoresis 4mg /ml Dexamethasone , 79439 (1-2 muscles), 20561 (3+ muscles)- Dry Needling, Patient/Family education, Balance training, Stair training, Taping, Joint mobilization, Cryotherapy, and Moist heat  PLAN FOR NEXT SESSION: Assess response to  HEP. Work on ankle stability within pain free range. Foot strengthening. Single limb strength/stability on L. Improving ankle strength for push off to progress towards running as tolerated.   For all possible CPT codes, reference the Planned Interventions line above.     Check all conditions that are expected to impact treatment: {Conditions expected to impact treatment:None of these apply   If treatment provided at initial evaluation, no treatment charged due to lack of authorization.        [place] Odessia Scot, PT, DPT 07/25/2024, 1:06 PM

## 2024-08-05 ENCOUNTER — Ambulatory Visit: Admitting: Physical Therapy

## 2024-08-05 ENCOUNTER — Telehealth: Payer: Self-pay | Admitting: Physical Therapy

## 2024-08-05 NOTE — Telephone Encounter (Signed)
 Attempted to contact patient regarding missed appointment and attendance policy. Voicemail is not set up. Unable to leave a message.

## 2024-08-07 ENCOUNTER — Telehealth: Payer: Self-pay

## 2024-08-07 ENCOUNTER — Ambulatory Visit: Payer: Self-pay

## 2024-08-07 NOTE — Telephone Encounter (Signed)
 Pt advised of no show and next appointment. Pt confirmed next appt time works.

## 2024-08-14 ENCOUNTER — Ambulatory Visit: Payer: Self-pay | Attending: Family Medicine

## 2024-08-14 DIAGNOSIS — M25572 Pain in left ankle and joints of left foot: Secondary | ICD-10-CM | POA: Diagnosis present

## 2024-08-14 DIAGNOSIS — R262 Difficulty in walking, not elsewhere classified: Secondary | ICD-10-CM | POA: Insufficient documentation

## 2024-08-14 DIAGNOSIS — M6281 Muscle weakness (generalized): Secondary | ICD-10-CM | POA: Insufficient documentation

## 2024-08-14 NOTE — Therapy (Signed)
 " OUTPATIENT PHYSICAL THERAPY TREATMENT  Progress Note Reporting Period 05/31/24 to 07/25/24  See note below for Objective Data and Assessment of Progress/Goals.    Patient Name: Christy [name] MRN: [id number] DOB:07/10/1981, 44 y.o., female Today's Date: 08/14/2024  END OF SESSION:  PT End of Session - 08/14/24 1154     Visit Number 4    Number of Visits 12    Date for Recertification  09/27/24    Authorization Type [employer]    PT Start Time 1147   pt late   PT Stop Time 1230    PT Time Calculation (min) 43 min    Activity Tolerance Patient tolerated treatment well              Past Medical History:  Diagnosis Date   Anemia    Preeclampsia, severe 03/29/2016   Past Surgical History:  Procedure Laterality Date   NO PAST SURGERIES     Patient Active Problem List   Diagnosis Date Noted   Dysuria 06/07/2024   Encounter for IUD removal 06/07/2024   IUD (intrauterine device) in place 10/31/2017   SVD (spontaneous vaginal delivery) 09/22/2017   Noncompliance 07/26/2017   Anemia complicating pregnancy 07/14/2017   Supervision of other normal pregnancy, antepartum 05/01/2017   Encounter for supervision of multigravida with advanced maternal age 60/03/2016    PCP: Patient, No Pcp Per  REFERRING PROVIDER: Irving Delinda ORN, MD  REFERRING DIAG: S93.602A (ICD-10-CM) - Sprain of left foot, initial encounter  THERAPY DIAG:  Pain in left ankle and joints of left foot  Difficulty in walking, not elsewhere classified  Muscle weakness (generalized)  Rationale for Evaluation and Treatment: Rehabilitation  ONSET DATE: 05/05/24  SUBJECTIVE:   SUBJECTIVE STATEMENT: LLE 5/10 pain currently. Pt was extremely sick the last 2 visits and unable to cancel in advance. Hasn't done HEP since last week.   From eval: Pt reports she had a fall on ~1 month ago. Pt states she was walking out of a store and hit a machine that was left by the door with her foot and fell on her  knee. Pt states she twisted her R knee and L foot. Pt reports standing on her L foot causes pain and throbbing. Unable to stand longer than 10 minutes. Pt was given an ankle brace but sometimes it will make her foot hurt a little bit (thinks it may be too tight). Reports some mild swelling. Pt states she was walking and running every day but no longer able to walk as much. Cannot tolerate any running now.   PERTINENT HISTORY: none  PAIN:  Are you having pain? Yes: NPRS scale: 0 at rest, at worst 8/10 Pain location: Whole Bottom of L foot (primarily laterally along pinky toe) to front of her shin Pain description: Aching Aggravating factors: prolonged standing/weight bearing Relieving factors: Elevating  PRECAUTIONS: None  RED FLAGS: None   WEIGHT BEARING RESTRICTIONS: No  FALLS:  Has patient fallen in last 6 months? Yes. 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  DIAGNOSTIC FINDINGS: Unable to see  x-ray for L foot/ankle on Epic  PATIENT SURVEYS:  LEFS  Extreme difficulty/unable (0), Quite a bit of difficulty (1), Moderate difficulty (2), Little difficulty (3), No difficulty (4) Survey date:  05/31/24  Any of your usual work, housework or school activities 1  2. Usual hobbies, recreational or sporting activities 1  3. Getting into/out of the bath 3  4. Walking between rooms 3  5. Putting on socks/shoes 4  6. Squatting  1  7. Lifting an object, like a bag of groceries from the floor 3  8. Performing light activities around  your home 2  9. Performing heavy activities around your home 1  10. Getting into/out of a car 2  11. Walking 2 blocks 1  12. Walking 1 mile 0  13. Going up/down 10 stairs (1 flight) 1  14. Standing for 1 hour 0  15.  sitting for 1 hour 2  16. Running on even ground 0  17. Running on uneven ground 0  18. Making sharp turns while running fast 0  19. Hopping  0  20. Rolling over in bed 4  Score total:  29/80    07/25/24: 35/80   SENSATION: WFL currently Gets N/T in foot when driving and sleeping  87/81: occasionally only  EDEMA:  Mild edema  PALPATION: TTP along peroneals -- especially insertion to plantar surface of L foot, ant tib, and along 5th metatarsal (lateral and plantar surface)      LOWER EXTREMITY ROM:  Active ROM Right eval Left Eval (WFL as R 07/25/24)  Hip flexion    Hip extension    Hip abduction    Hip adduction    Hip internal rotation    Hip external rotation    Knee flexion    Knee extension    Ankle dorsiflexion 20 15 tense, 20   Ankle plantarflexion 45 45  Ankle inversion 15 20  Ankle eversion 15 15   (Blank rows = not tested)  LOWER EXTREMITY MMT:  MMT Right eval Left Eval (07/25/24)  Hip flexion 4 4  Hip extension    Hip abduction    Hip adduction    Hip internal rotation    Hip external rotation    Knee flexion 4 3+, 4-  Knee extension 4 4  Ankle dorsiflexion 4 3+, 4-  Ankle plantarflexion 4 3+ (unable to perform single leg heel raise in standing)  Ankle inversion 4 3+ pain, 4-   Ankle eversion 4 4   (Blank rows = not tested)  LOWER EXTREMITY SPECIAL TESTS:  Did not assess  FUNCTIONAL TESTS:  SLS: Unable to perform on L, appx 5 seconds (3/10 pain) Single leg heel raise: unable on L, L 75% of R (7/10 pain)  GAIT: Distance walked: Into clinic Assistive device utilized: None Level of assistance: Complete Independence Comments: Mildly antalgic with L LE weight bearing  TREATMENT DATE:  08/14/24 Neuromuscular Reeducation: HEP update and reassessment Red TB PF 2x8x3s Red circles Cw/ccw 2x8 GTB PF 2x8x3s GTB EV x8x3s Standing calf raise 2x6x3s Countertop SLS 3x5s BL Towel scrunch 2x8x3s     07/25/24 Therapeutic Activity: Functional reassessment and update of goals HEP reassessment and update     07/11/24 Neuromuscular Reeducation: HEP reassessment and update Red TB PF 2x8x3s Red circles Cw/ccw 2x6x3s    Manual Therapy: STM to left calf     06/21/24 Nustep L6 x 8 min LEs only Standing gastroc stretch on slant board x 30 Standing soleus stretch on slant board x 30 Standing eccentric heel raise 2x8 Standing double leg heel raise with ball squeeze 2x10 Tandem stance x30 Single leg clock reach x10 Seated ankle eversion green TB 3x10 Seated ankle inversion green TB 3x10   06/07/24 Nustep L3 x 6 min LEs only Standing gastroc stretch x 30 Standing soleus stretch x 30 Towel scrunch x 30 Marble pick up 2x10 Seated ankle eversion red TB 3x10 Seated ankle inversion red TB 3x10 Standing double leg heel raise with ball squeeze 2x10 Standing double leg heel raise with toes turned out 2x10 Tandem stance 2x20  05/31/24 See HEP below    PATIENT EDUCATION:  Education details: Exam findings, POC, initial HEP Person educated: Patient Education method: Explanation, Demonstration, and Handouts Education comprehension: verbalized understanding, returned demonstration, and needs further education  HOME EXERCISE PROGRAM: Access Code: [email or web address] URL: [email or web address] Date: 06/21/2024 Prepared by: Gellen April Earnie Starring  Exercises - Seated Ankle Eversion with Resistance  - 1 x daily - 7 x weekly - 3 sets - 10 reps - Seated Ankle Inversion with Resistance  - 1 x daily - 7 x weekly - 3 sets - 10 reps - Standing Calf Raise With Small  Ball at Heels  - 1 x daily - 7 x weekly - 2 sets - 10 reps - Gastroc Stretch on Wall  - 1 x daily - 7 x weekly - 2 sets - 30 sec hold - Soleus Stretch on Wall  - 1 x daily - 7 x weekly - 2 sets - 30 sec hold - Standing Eccentric Heel Raise  - 1 x daily - 7 x weekly - 2 sets - 10 reps - Single Leg Balance with Clock Reach  - 1 x daily - 7 x weekly - 1 sets - 10 reps  12/18 Standing calf raise 2x6x3s Band toe outs 2x6x3s Single leg stance 2x5s Toe towel scrunch 2x8x3s   Walking/jogging as tolerated  ASSESSMENT:  CLINICAL IMPRESSION: Patient tolerated treatment with no significant increases in pain with progressions in HEP progression and programming, balance, and LLE loading. Current deficits include: excessive pain, standing tolerance, and functional strength. Patient has met most goals and is compliant with HEP. However, patient still has aforementioned deficits and cannot run. As a result, patient would continue to benefit from skilled PT to address said deficits via plan below.     From eval: Patient is a 45 y.o. F who was seen today for physical therapy evaluation and treatment for L ankle sprain/foot pain. Pt had a fall ~1 month ago twisting her R knee and L foot/ankle. R knee pain is slowly decreasing; however, pt with great difficulty tolerating L LE weight bearing. Assessment is significant for full ankle AROM but most painful and weak with ankle inversion/DF and PF. Pt is tender along her L peroneals and tendinous insertion to the plantar surface of her 5th metatarsal. Pt will  benefit from PT to improve on these deficits for return to pt's PLOF.   OBJECTIVE IMPAIRMENTS: Abnormal gait, decreased activity tolerance, decreased balance, decreased endurance, decreased strength, increased fascial restrictions, increased muscle spasms, impaired sensation, postural dysfunction, and pain.   ACTIVITY LIMITATIONS: standing, squatting, stairs, and locomotion level  PARTICIPATION LIMITATIONS:  driving, shopping, community activity, and occupation  PERSONAL FACTORS: Age, Fitness, Past/current experiences, and Time since onset of injury/illness/exacerbation are also affecting patient's functional outcome.   REHAB POTENTIAL: Good  CLINICAL DECISION MAKING: Evolving/moderate complexity  EVALUATION COMPLEXITY: Moderate   GOALS: Goals reviewed with patient? Yes  SHORT TERM GOALS: Target date: 06/28/2024  Pt will be ind with initial HEP Baseline: Goal status: MET  2.  Pt will be able to perform single leg heel raise without any pain or issues on L Baseline:  06/21/24: 50% of R LE's heel raise height but no pain Goal status: MET  3.  Pt will be able to tolerate standing at least 15 min to fold laundry/wash dishes Baseline:  07/25/24: 5 minutes (tried to start running progression too fast) Goal status: in progress    LONG TERM GOALS: Target date: 09/27/2024   Pt will be ind with management and progression of HEP Baseline:  Goal status: MET  2.  Pt will have improved LEFS to >/=38/80 to demo MCID Baseline:  Goal status: In progress  3.  Pt will be able to perform SLS on L for at least 20 sec to demo increased weight bearing tolerance/ankle stability Baseline:  12/18: ~7 seconds Goal status: ongoing  4.  Pt will report improved overall pain by >/=75% Baseline:  12/18: 60% Goal status: Ongoing    5. Patient will be able to run at least 75% original capacity to demonstrate improvements in functional activity tolerance, strength, balance, pain, and overall QOL    Baseline: 0%    Goal status: initial     PLAN:  PT FREQUENCY: 1-2x/week  PT DURATION: 8 weeks  PLANNED INTERVENTIONS: [id number]- PT Re-evaluation, 97750- Physical Performance Testing, 97110-Therapeutic exercises, 97530- Therapeutic activity, [id number]- Neuromuscular re-education, 97535- Self Care, [id number]- Manual therapy, [id number]- Gait training, [phone]- Electrical stimulation (unattended), 97016-  Vasopneumatic device, [id number]- Ultrasound, [id number]- Ionotophoresis 4mg /ml Dexamethasone , 79439 (1-2 muscles), [id number] (3+ muscles)- Dry Needling, Patient/Family education, Balance training, Stair training, Taping, Joint mobilization, Cryotherapy, and Moist heat  PLAN FOR NEXT SESSION: Assess response to HEP. Work on ankle stability within pain free range. Foot strengthening. Single limb strength/stability on L.  For all possible CPT codes, reference the Planned Interventions line above.     Check all conditions that are expected to impact treatment: {Conditions expected to impact treatment:None of these apply   If treatment provided at initial evaluation, no treatment charged due to lack of authorization.        Washington Odessia Scot, PT, DPT 08/14/2024, 1:27 PM  "

## 2024-08-16 ENCOUNTER — Ambulatory Visit: Payer: Self-pay

## 2024-08-19 ENCOUNTER — Ambulatory Visit

## 2024-08-21 ENCOUNTER — Ambulatory Visit

## 2024-08-22 ENCOUNTER — Ambulatory Visit: Admitting: Physical Therapy

## 2024-08-26 ENCOUNTER — Ambulatory Visit: Payer: Self-pay | Admitting: Physical Therapy

## 2024-08-27 ENCOUNTER — Ambulatory Visit

## 2024-08-28 ENCOUNTER — Ambulatory Visit: Payer: Self-pay
